# Patient Record
Sex: Female | Born: 1985 | Race: Black or African American | Hispanic: No | Marital: Single | Smoking: Never smoker
Health system: Southern US, Community
[De-identification: ages and names within clinical notes are randomized; demographics above are authoritative.]

## PROBLEM LIST (undated history)

## (undated) DIAGNOSIS — I10 Essential (primary) hypertension: Secondary | ICD-10-CM

## (undated) DIAGNOSIS — F419 Anxiety disorder, unspecified: Secondary | ICD-10-CM

## (undated) DIAGNOSIS — F1910 Other psychoactive substance abuse, uncomplicated: Secondary | ICD-10-CM

## (undated) DIAGNOSIS — F32A Depression, unspecified: Secondary | ICD-10-CM

## (undated) DIAGNOSIS — M311 Thrombotic microangiopathy: Secondary | ICD-10-CM

## (undated) DIAGNOSIS — Z9289 Personal history of other medical treatment: Secondary | ICD-10-CM

## (undated) DIAGNOSIS — E039 Hypothyroidism, unspecified: Secondary | ICD-10-CM

## (undated) DIAGNOSIS — F329 Major depressive disorder, single episode, unspecified: Secondary | ICD-10-CM

## (undated) DIAGNOSIS — D649 Anemia, unspecified: Secondary | ICD-10-CM

## (undated) DIAGNOSIS — M3119 Other thrombotic microangiopathy: Secondary | ICD-10-CM

## (undated) HISTORY — DX: Other thrombotic microangiopathy: M31.19

## (undated) HISTORY — PX: TUBAL LIGATION: SHX77

## (undated) HISTORY — DX: Major depressive disorder, single episode, unspecified: F32.9

## (undated) HISTORY — DX: Anemia, unspecified: D64.9

## (undated) HISTORY — DX: Anxiety disorder, unspecified: F41.9

## (undated) HISTORY — DX: Depression, unspecified: F32.A

## (undated) HISTORY — DX: Thrombotic microangiopathy: M31.1

## (undated) HISTORY — PX: OTHER SURGICAL HISTORY: SHX169

---

## 2015-11-25 DIAGNOSIS — R102 Pelvic and perineal pain: Secondary | ICD-10-CM | POA: Insufficient documentation

## 2017-11-01 DIAGNOSIS — F329 Major depressive disorder, single episode, unspecified: Secondary | ICD-10-CM | POA: Insufficient documentation

## 2017-11-01 DIAGNOSIS — D696 Thrombocytopenia, unspecified: Secondary | ICD-10-CM | POA: Insufficient documentation

## 2017-11-01 DIAGNOSIS — F32A Depression, unspecified: Secondary | ICD-10-CM | POA: Insufficient documentation

## 2017-11-01 DIAGNOSIS — D649 Anemia, unspecified: Secondary | ICD-10-CM | POA: Insufficient documentation

## 2017-11-15 ENCOUNTER — Telehealth: Payer: Self-pay | Admitting: Hematology

## 2017-11-15 NOTE — Telephone Encounter (Signed)
Pt has been scheduled to see Dr. Irene Limbo on 6/24 at 1pm. Pt aware to arrive 30 minutes early.

## 2017-11-23 NOTE — Progress Notes (Signed)
HEMATOLOGY/ONCOLOGY CONSULTATION NOTE  Date of Service: 11/26/2017  Patient Care Team: Patient, No Pcp Per as PCP - General (General Practice)  CHIEF COMPLAINTS/PURPOSE OF CONSULTATION:  Thrombotic microangiopathy  HISTORY OF PRESENTING ILLNESS:   Sarah Washington is a wonderful 32 y.o. female who has been referred to [place] by Dr Mabeline Caras for evaluation and management of recently diagnosed acquired TTP/Thrombotic microangiopathy. She is accompanied today by her three children. The pt reports that she is doing well overall.   The pt reports that in January 2019 she began feeling weak, tired, and developed a newly heavy menstrual cycle. She also sites bruising easily that began occurring in January 2019 but denies nose bleeds, gum bleeds, and blood in the stools at the time. She notes that she first had labs to evaluate these symptoms She noted finger tip neuropathy, severe headache, abdominal pains, and tingling in her feet. She denies any infections at the time, PO contraceptives, hormone therapy, and new medications at the time. She also denies recent blood transfusions as well. She was discharged with Atovaquone (for PCP prophylaxis). 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  The pt presented to [facility] on 11/02/17 for TTP.  Her last plasma exchange was on 11/07/17. She received 5 total plasma exchanges. She received her third Rituxan infusion on 11/15/17, and only notes some mild itching but denies any rashes with her infusion. She has not yet received her fourth dose or Rituxan as she has moved from [facility] to [place]. She has been taking 30mg  Prednisone since 11/15/17.  She adds that she has gained 20 pounds of weight, noting that she has a very strong appetite while taking steroids.    The pt has previously taken BP medication for her  HTN.  She received 5 units of blood, and no plasma exchange after delivery of her third child was premature at 5 months due to HELLP syndrome.   Most recent lab results (11/15/17) of CBC  is as follows: all values are WNL except for WBC at 12.4k, RBC at 3.69, HGB at 11.8, RDW at 22.3, MPV at 6.9, PLT at 137k.  On review of systems, pt reports increased appetite, weight gain, normalized menstrual bleeding, and denies headaches, peripheral neuropathy, fevers, chills, night sweats, back pains, abdominal pains, changes in bowel habits, and any other symptoms.   On PMHx the pt reports HTN, tubal ligation in 2010, HELLP syndrome with third pregnancy requiring delivery after 5 months. Angioedema reaction to sulfa drugs.  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  On Family Hx the pt denies any blood, cancer, or autoimmune disorders.   MEDICAL HISTORY:   HTN, tubal ligation in 2010, HELLP syndrome with third pregnancy requiring delivery after 5 months. Angioedema reaction to sulfa drugs.   SURGICAL HISTORY: tubal ligation in 2010  SOCIAL HISTORY: Social History   Socioeconomic History  . Marital status: Single    Spouse name: Not on file  . Number of children: Not on file  . Years of education: Not on file  . Highest education level: Not on file  Occupational History  . Not on file  Social Needs  . Financial resource strain: Not on file  . Food insecurity:    Worry: Not on file    Inability: Not on file  . Transportation needs:    Medical: Not on  file    Non-medical: Not on file  Tobacco Use  . Smoking status: Former Smoker    Types: Cigarettes    Last attempt to quit: 10/2017    Years since quitting: 0.1  . Smokeless tobacco: Never Used  Substance and Sexual Activity  . Alcohol use: Yes  . Drug use: Never  . Sexual activity: Yes  Lifestyle  . Physical activity:    Days per week: Not on file    Minutes per session: Not on file  . Stress: Not on file  Relationships  .  Social connections:    Talks on phone: Not on file    Gets together: Not on file    Attends religious service: Not on file    Active member of club or organization: Not on file    Attends meetings of clubs or organizations: Not on file    Relationship status: Not on file  . Intimate partner violence:    Fear of current or ex partner: Not on file    Emotionally abused: Not on file    Physically abused: Not on file    Forced sexual activity: Not on file  Other Topics Concern  . Not on file  Social History Narrative  . Not on file    FAMILY HISTORY: Family History  Problem Relation Age of Onset  . Hypertension Mother   . Diabetes Mother   . Diabetes Father   . Hypertension Father     ALLERGIES:  is allergic to sulfa antibiotics.  MEDICATIONS:  Current Outpatient Medications  Medication Sig Dispense Refill  . folic acid (FOLVITE) 1 MG tablet Take 1 mg by mouth daily.    . pantoprazole (PROTONIX) 40 MG tablet Take 40 mg by mouth daily.     No current facility-administered medications for this visit.     REVIEW OF SYSTEMS:    10 Point review of Systems was done is negative except as noted above.  PHYSICAL EXAMINATION:  . Vitals:   11/26/17 1301  BP: (!) 145/93  Pulse: 100  Resp: 18  Temp: 99.4 F (37.4 C)  SpO2: 100%   Filed Weights   11/26/17 1301  Weight: 206 lb 8 oz (93.7 kg)   .Body mass index is 32.34 kg/m.  GENERAL:alert, in no acute distress and comfortable SKIN: no acute rashes, no significant lesions EYES: conjunctiva are pink and non-injected, sclera anicteric OROPHARYNX: MMM, no exudates, no oropharyngeal erythema or ulceration NECK: supple, no JVD LYMPH:  no palpable lymphadenopathy in the cervical, axillary or inguinal regions LUNGS: clear to auscultation b/l with normal respiratory effort HEART: regular rate & rhythm ABDOMEN:  normoactive bowel sounds , non tender, not distended. Extremity: no pedal edema PSYCH: alert & oriented x 3 with  fluent speech NEURO: no focal motor/sensory deficits  LABORATORY DATA:  I have reviewed the data as listed  .No flowsheet data found.  .No flowsheet data found.     11/15/17      . CBC Latest Ref Rng & Units 11/26/2017  WBC 3.9 - 10.3 K/uL 11.9(H)  Hemoglobin 11.6 - 15.9 g/dL 12.7  Hematocrit 34.8 - 46.6 % 38.1  Platelets 145 - 400 K/uL 238    . CMP Latest Ref Rng & Units 11/26/2017  Glucose 70 - 140 mg/dL 98  BUN 7 - 26 mg/dL 18  Creatinine 0.60 - 1.10 mg/dL 0.83  Sodium 136 - 145 mmol/L 137  Potassium 3.5 - 5.1 mmol/L 4.2  Chloride 98 - 109 mmol/L 105  CO2 22 - 29 mmol/L 24  Calcium 8.4 - 10.4 mg/dL 9.6  Total Protein 6.4 - 8.3 g/dL 7.6  Total Bilirubin 0.2 - 1.2 mg/dL 0.2  Alkaline Phos 40 - 150 U/L 49  AST 5 - 34 U/L 16  ALT 0 - 55 U/L 22   . Lab Results  Component Value Date   LDH 241 11/26/2017   Component     Latest Ref Rng & Units 11/26/2017  Adamts 13 Activity     >66.8 % 69.0  ADAMTS13 Activity comment      Comment    RADIOGRAPHIC STUDIES: I have personally reviewed the radiological images as listed and agreed with the findings in the report. No results found.  ASSESSMENT & PLAN:   32 y.o. female with  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 Severely low ADAMTS 13 levels of 6 on diagnosis.  S/p Plasmapheresis x 5. Rituxan weekly x 3 doses. PLAN  -Discussed patient's most recent labs from 11/15/17, WBC at 12.4k, PLT at 137k  -Will order labs today and results reviewed. PLT normalized. LDH WNL. -Will set pt up for fourth and last infusion of Rituxan ASAP -rpt ADAM TS13 levels done today have normalized at 69 -Will continue to monitor blood counts weekly -may reduce prednisone to 20mg  po daily -on Atovaquone for PJP prophylaxis -Will see pt back in 2 weeks   Labs today and weekly x 6 Rituxan needs to get her 4th and last doses ASAP (first 3 doses at [facility]) for TTP -RTC with Dr Irene Limbo in 2 weeks   All  of the patients questions were answered with apparent satisfaction. The patient knows to call the clinic with any problems, questions or concerns.  The toal time spent in the appt was 60 minutes and more than 50% was on counseling and direct patient cares, reviewing extensive outside records and education patient and answering multiple questions.    Sullivan Lone MD MS AAHIVMS [facility] Hematology/Oncology Physician [facility]  (Office):       [phone] (Work cell):  [phone] (Fax):           [phone]  11/26/2017 1:57 PM  I, Baldwin Jamaica, am acting as a Education administrator for Dr Irene Limbo.   .I have reviewed the above documentation for accuracy and completeness, and I agree with the above. Brunetta Genera MD

## 2017-11-26 ENCOUNTER — Inpatient Hospital Stay: Payer: Medicaid Other | Attending: Hematology | Admitting: Hematology

## 2017-11-26 ENCOUNTER — Encounter: Payer: Self-pay | Admitting: Hematology

## 2017-11-26 ENCOUNTER — Inpatient Hospital Stay: Payer: Medicaid Other

## 2017-11-26 VITALS — BP 145/93 | HR 100 | Temp 99.4°F | Resp 18 | Ht 67.0 in | Wt 206.5 lb

## 2017-11-26 DIAGNOSIS — Z5112 Encounter for antineoplastic immunotherapy: Secondary | ICD-10-CM | POA: Diagnosis not present

## 2017-11-26 DIAGNOSIS — M311 Thrombotic microangiopathy: Secondary | ICD-10-CM

## 2017-11-26 DIAGNOSIS — M3119 Other thrombotic microangiopathy: Secondary | ICD-10-CM

## 2017-11-26 DIAGNOSIS — Z7189 Other specified counseling: Secondary | ICD-10-CM

## 2017-11-26 LAB — CMP (CANCER CENTER ONLY)
ALK PHOS: 49 U/L (ref 40–150)
ALT: 22 U/L (ref 0–55)
AST: 16 U/L (ref 5–34)
Albumin: 3.9 g/dL (ref 3.5–5.0)
Anion gap: 8 (ref 3–11)
BUN: 18 mg/dL (ref 7–26)
CALCIUM: 9.6 mg/dL (ref 8.4–10.4)
CO2: 24 mmol/L (ref 22–29)
CREATININE: 0.83 mg/dL (ref 0.60–1.10)
Chloride: 105 mmol/L (ref 98–109)
GFR, Estimated: >60 mL/min (ref >60)
Glucose, Bld: 98 mg/dL (ref 70–140)
Potassium: 4.2 mmol/L (ref 3.5–5.1)
SODIUM: 137 mmol/L (ref 136–145)
Total Bilirubin: 0.2 mg/dL (ref 0.2–1.2)
Total Protein: 7.6 g/dL (ref 6.4–8.3)

## 2017-11-26 LAB — IRON AND TIBC
Iron: 59 ug/dL (ref 41–142)
Saturation Ratios: 19 % — ABNORMAL LOW (ref 21–57)
TIBC: 316 ug/dL (ref 236–444)
UIBC: 257 ug/dL

## 2017-11-26 LAB — CBC WITH DIFFERENTIAL/PLATELET
BASOS PCT: 1 %
Basophils Absolute: 0.1 10*3/uL (ref 0.0–0.1)
EOS ABS: 0 10*3/uL (ref 0.0–0.5)
EOS PCT: 0 %
HCT: 38.1 % (ref 34.8–46.6)
HEMOGLOBIN: 12.7 g/dL (ref 11.6–15.9)
Lymphocytes Relative: 11 %
Lymphs Abs: 1.3 10*3/uL (ref 0.9–3.3)
MCH: 31.7 pg (ref 25.1–34.0)
MCHC: 33.4 g/dL (ref 31.5–36.0)
MCV: 94.9 fL (ref 79.5–101.0)
MONO ABS: 0.2 10*3/uL (ref 0.1–0.9)
MONOS PCT: 1 %
NEUTROS PCT: 87 %
Neutro Abs: 10.3 10*3/uL — ABNORMAL HIGH (ref 1.5–6.5)
PLATELETS: 238 10*3/uL (ref 145–400)
RBC: 4.02 MIL/uL (ref 3.70–5.45)
RDW: 20.7 % — AB (ref 11.2–14.5)
WBC: 11.9 10*3/uL — ABNORMAL HIGH (ref 3.9–10.3)

## 2017-11-26 LAB — LACTATE DEHYDROGENASE: LDH: 241 U/L (ref 125–245)

## 2017-11-26 LAB — FERRITIN: Ferritin: 92 ng/mL (ref 9–269)

## 2017-11-26 NOTE — Progress Notes (Signed)
START OFF PATHWAY REGIMEN - Tawni Pummel Dx]   [id number]:Rituximab (Weekly):   Administer weekly:     Rituximab   **Always confirm dose/schedule in your pharmacy ordering system**  Administration Notes: for Acquired Severe TTP  Patient Characteristics: Intent of Therapy: Curative Intent, Discussed with Patient

## 2017-11-27 LAB — HEPATITIS C ANTIBODY: HCV Ab: <0.1 s/co ratio (ref 0.0–0.9)

## 2017-11-27 LAB — HEPATITIS B CORE ANTIBODY, TOTAL: Hep B Core Total Ab: NEGATIVE

## 2017-11-27 LAB — HIV ANTIBODY (ROUTINE TESTING W REFLEX): HIV Screen 4th Generation wRfx: NONREACTIVE

## 2017-11-27 LAB — HAPTOGLOBIN: Haptoglobin: 176 mg/dL (ref 34–200)

## 2017-11-28 LAB — ADAMTS13 ACTIVITY REFLEX

## 2017-11-28 LAB — ADAMTS13 ACTIVITY: Adamts 13 Activity: 69 % (ref >66.8)

## 2017-11-30 ENCOUNTER — Other Ambulatory Visit: Payer: Self-pay | Admitting: Hematology

## 2017-11-30 ENCOUNTER — Inpatient Hospital Stay: Payer: Medicaid Other

## 2017-11-30 VITALS — BP 146/89 | HR 98 | Temp 99.1°F | Resp 19

## 2017-11-30 DIAGNOSIS — M3119 Other thrombotic microangiopathy: Secondary | ICD-10-CM

## 2017-11-30 DIAGNOSIS — Z5112 Encounter for antineoplastic immunotherapy: Secondary | ICD-10-CM | POA: Diagnosis not present

## 2017-11-30 DIAGNOSIS — M311 Thrombotic microangiopathy: Secondary | ICD-10-CM

## 2017-11-30 DIAGNOSIS — Z7189 Other specified counseling: Secondary | ICD-10-CM

## 2017-11-30 MED ORDER — SODIUM CHLORIDE 0.9 % IV SOLN
375.0000 mg/m2 | Freq: Once | INTRAVENOUS | Status: AC
  Administered 2017-11-30: 800 mg via INTRAVENOUS
  Filled 2017-11-30: qty 30

## 2017-11-30 MED ORDER — DIPHENHYDRAMINE HCL 25 MG PO CAPS
50.0000 mg | ORAL_CAPSULE | Freq: Once | ORAL | Status: AC
  Administered 2017-11-30: 50 mg via ORAL

## 2017-11-30 MED ORDER — SODIUM CHLORIDE 0.9 % IV SOLN
Freq: Once | INTRAVENOUS | Status: AC
Start: 2017-11-30 — End: 2017-11-30
  Administered 2017-11-30: 13:00:00 via INTRAVENOUS

## 2017-11-30 MED ORDER — ACETAMINOPHEN 325 MG PO TABS
ORAL_TABLET | ORAL | Status: AC
  Filled 2017-11-30: qty 2

## 2017-11-30 MED ORDER — DIPHENHYDRAMINE HCL 25 MG PO CAPS
ORAL_CAPSULE | ORAL | Status: AC
  Filled 2017-11-30: qty 2

## 2017-11-30 MED ORDER — ACETAMINOPHEN 325 MG PO TABS
650.0000 mg | ORAL_TABLET | Freq: Once | ORAL | Status: AC
  Administered 2017-11-30: 650 mg via ORAL

## 2017-11-30 NOTE — Progress Notes (Signed)
I contacted [place] s/w Douglass Rivers, RN [phone]) who informed me of the following doses/administration of Rituxan: 11/01/17 Rituxan 746 mg IV given over standard rate; pt was inpatient. 11/08/17 Rituxan 746 mg IV given over "subsequent" rate; somewhat accelerated rate; not Rapid Inf Rituxan (this is their protocol); pt was inpatient. 11/15/17 Rituxan 746 mg IV given as Rapid Inf Rituxan.  Pt tolerated without complication.  Orders have been changed to Rapid Inf Rituxan to be given here today at [facility].  Per RN staff, pt prefers to get Rapid Inf Rituxan today.  Above info has been shared with Dr. Grier Mitts desk RN, Lanelle Bal and she will have Dr. Irene Limbo sign the orders.  Kennith Center, Pharm.D., CPP 11/30/2017@12 :56 PM

## 2017-11-30 NOTE — Patient Instructions (Signed)
[facility] Discharge Instructions for Patients Receiving Chemotherapy  Today you received the following chemotherapy agents:  Rituxan.  To help prevent nausea and vomiting after your treatment, we encourage you to take your nausea medication as directed.   If you develop nausea and vomiting that is not controlled by your nausea medication, call the clinic.   BELOW ARE SYMPTOMS THAT SHOULD BE REPORTED IMMEDIATELY:  *FEVER GREATER THAN 100.5 F  *CHILLS WITH OR WITHOUT FEVER  NAUSEA AND VOMITING THAT IS NOT CONTROLLED WITH YOUR NAUSEA MEDICATION  *UNUSUAL SHORTNESS OF BREATH  *UNUSUAL BRUISING OR BLEEDING  TENDERNESS IN MOUTH AND THROAT WITH OR WITHOUT PRESENCE OF ULCERS  *URINARY PROBLEMS  *BOWEL PROBLEMS  UNUSUAL RASH Items with * indicate a potential emergency and should be followed up as soon as possible.  Feel free to call the clinic should you have any questions or concerns. The clinic phone number is [phone].  Please show the CHEMO ALERT CARD at check-in to the Emergency Department and triage nurse.   

## 2017-11-30 NOTE — Progress Notes (Signed)
Spoke with patient over the phone today prior to arrival regarding her appointment length and advised her that as a first time Rituxan she would be here longer than scheduled and wanted to make her aware of that. She stated "this is my 4 time and I have had Rituxan 3 other times and I have been rapid Rituxan before and I have to be rapid today. I have done well in the past, not had problems and I cannot stay any longer today because I am moving and need to pick up the keys to my apartment". Pharmacy aware and confirmed this with [facility] and Dr. Irene Limbo aware that this was the patient's request.

## 2017-12-03 ENCOUNTER — Other Ambulatory Visit: Payer: Medicaid Other

## 2017-12-03 ENCOUNTER — Telehealth: Payer: Self-pay

## 2017-12-03 ENCOUNTER — Inpatient Hospital Stay: Payer: Medicaid Other | Attending: Hematology

## 2017-12-03 DIAGNOSIS — Z7189 Other specified counseling: Secondary | ICD-10-CM

## 2017-12-03 DIAGNOSIS — M7989 Other specified soft tissue disorders: Secondary | ICD-10-CM | POA: Insufficient documentation

## 2017-12-03 DIAGNOSIS — M311 Thrombotic microangiopathy: Secondary | ICD-10-CM | POA: Insufficient documentation

## 2017-12-03 DIAGNOSIS — M3119 Other thrombotic microangiopathy: Secondary | ICD-10-CM

## 2017-12-03 DIAGNOSIS — I10 Essential (primary) hypertension: Secondary | ICD-10-CM | POA: Diagnosis not present

## 2017-12-03 DIAGNOSIS — Z87891 Personal history of nicotine dependence: Secondary | ICD-10-CM | POA: Insufficient documentation

## 2017-12-03 LAB — CBC WITH DIFFERENTIAL/PLATELET
Basophils Absolute: 0 10*3/uL (ref 0.0–0.1)
Basophils Relative: 0 %
EOS ABS: 0 10*3/uL (ref 0.0–0.5)
Eosinophils Relative: 0 %
HEMATOCRIT: 37 % (ref 34.8–46.6)
HEMOGLOBIN: 12.2 g/dL (ref 11.6–15.9)
LYMPHS ABS: 1.5 10*3/uL (ref 0.9–3.3)
Lymphocytes Relative: 17 %
MCH: 31.5 pg (ref 25.1–34.0)
MCHC: 33 g/dL (ref 31.5–36.0)
MCV: 95.6 fL (ref 79.5–101.0)
MONOS PCT: 1 %
Monocytes Absolute: 0.1 10*3/uL (ref 0.1–0.9)
NEUTROS PCT: 82 %
Neutro Abs: 7.6 10*3/uL — ABNORMAL HIGH (ref 1.5–6.5)
PLATELETS: 337 10*3/uL (ref 145–400)
RBC: 3.87 MIL/uL (ref 3.70–5.45)
RDW: 17 % — AB (ref 11.2–14.5)
WBC: 9.1 10*3/uL (ref 3.9–10.3)

## 2017-12-03 LAB — CMP (CANCER CENTER ONLY)
ALBUMIN: 4 g/dL (ref 3.5–5.0)
ALT: 21 U/L (ref 0–44)
ANION GAP: 7 (ref 5–15)
AST: 13 U/L — ABNORMAL LOW (ref 15–41)
Alkaline Phosphatase: 58 U/L (ref 38–126)
BUN: 17 mg/dL (ref 6–20)
CO2: 25 mmol/L (ref 22–32)
Calcium: 9.6 mg/dL (ref 8.9–10.3)
Chloride: 108 mmol/L (ref 98–111)
Creatinine: 1.18 mg/dL — ABNORMAL HIGH (ref 0.44–1.00)
GFR, EST NON AFRICAN AMERICAN: 59 mL/min — AB (ref >60)
GFR, Est AFR Am: >60 mL/min (ref >60)
Glucose, Bld: 104 mg/dL — ABNORMAL HIGH (ref 70–99)
POTASSIUM: 4.5 mmol/L (ref 3.5–5.1)
Sodium: 140 mmol/L (ref 135–145)
TOTAL PROTEIN: 7.6 g/dL (ref 6.5–8.1)

## 2017-12-03 LAB — RETICULOCYTES
RBC.: 3.87 MIL/uL (ref 3.70–5.45)
RETIC COUNT ABSOLUTE: 77.4 10*3/uL (ref 33.7–90.7)
RETIC CT PCT: 2 % (ref 0.7–2.1)

## 2017-12-03 NOTE — Telephone Encounter (Signed)
Per message received from Dr. Irene Limbo called the patient and let her know "her labs from 6/24 showed no anemia and normal platelets and ADAMTS 13 levels -- can reduce prednisone to 20mg  po daily -- further taper gradually based on f/u labs in 2 weeks. Patient verbalized understanding.

## 2017-12-04 LAB — LACTATE DEHYDROGENASE: LDH: 283 U/L — ABNORMAL HIGH (ref 98–192)

## 2017-12-07 NOTE — Progress Notes (Signed)
HEMATOLOGY/ONCOLOGY CONSULTATION NOTE  Date of Service: 12/10/2017  Patient Care Team: Patient, No Pcp Per as PCP - General (General Practice)  CHIEF COMPLAINTS/PURPOSE OF CONSULTATION:  Thrombotic microangiopathy  HISTORY OF PRESENTING ILLNESS:   Sarah Washington is a wonderful 32 y.o. female who has been referred to [place] by Dr Mabeline Caras for evaluation and management of recently diagnosed acquired TTP/Thrombotic microangiopathy. She is accompanied today by her three children. The pt reports that she is doing well overall.   The pt reports that in January 2019 she began feeling weak, tired, and developed a newly heavy menstrual cycle. She also sites bruising easily that began occurring in January 2019 but denies nose bleeds, gum bleeds, and blood in the stools at the time. She notes that she first had labs to evaluate these symptoms She noted finger tip neuropathy, severe headache, abdominal pains, and tingling in her feet. She denies any infections at the time, PO contraceptives, hormone therapy, and new medications at the time. She also denies recent blood transfusions as well. She was discharged with Atovaquone (for PCP prophylaxis). 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  The pt presented to [facility] on 11/02/17 for TTP.  Her last plasma exchange was on 11/07/17. She received 5 total plasma exchanges. She received her third Rituxan infusion on 11/15/17, and only notes some mild itching but denies any rashes with her infusion. She has not yet received her fourth dose or Rituxan as she has moved from [facility] to [place]. She has been taking 30mg  Prednisone since 11/15/17.  She adds that she has gained 20 pounds of weight, noting that she has a very strong appetite while taking steroids.    The pt has previously taken BP medication for her  HTN.  She received 5 units of blood, and no plasma exchange after delivery of her third child was premature at 5 months due to HELLP syndrome.   Most recent lab results (11/15/17) of CBC  is as follows: all values are WNL except for WBC at 12.4k, RBC at 3.69, HGB at 11.8, RDW at 22.3, MPV at 6.9, PLT at 137k.  On review of systems, pt reports increased appetite, weight gain, normalized menstrual bleeding, and denies headaches, peripheral neuropathy, fevers, chills, night sweats, back pains, abdominal pains, changes in bowel habits, and any other symptoms.   On PMHx the pt reports HTN, tubal ligation in 2010, HELLP syndrome with third pregnancy requiring delivery after 5 months. Angioedema reaction to sulfa drugs.  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  On Family Hx the pt denies any blood, cancer, or autoimmune disorders.  Interval History:   Sarah Washington returns today regarding management and evaluation of her TTP. The patient's last visit with [place] was on 11/26/17. The pt reports that she is doing well overall.   The pt reports that she tolerated the fourth and final Rituxan infusion well. She is down to 20mg  Prednisone on her taper and is pleased to report that her appetite has decreased to a more normal level. She is continuing to take Atovaquone without difficulty.   She notes that her right arm feels a little tight and believes that it has swollen some more than her left arm. She has noticed this over the past couple days and has attributed it to her recent weight gain while being on  steroids and having a stronger than normal appetite.   She continues on her folic acid and Vitamin B complex.   Lab results (12/03/17) of CBC w/diff, CMP, and Reticulocytes is as follows: all values are WNL except for RDW at 15.9, AST at 13, Total Bilirubin at <0.2. LDH 12/10/17 is elevated at 249 ADAM5S13 Activity on 11/26/17 was normal at 69.0  On review of systems, pt reports right arm  swelling, normalized appetite, and denies abdominal pains, leg swelling, SOB, CP and any other symptoms.   MEDICAL HISTORY:   HTN, tubal ligation in 2010, HELLP syndrome with third pregnancy requiring delivery after 5 months. Angioedema reaction to sulfa drugs.   SURGICAL HISTORY: tubal ligation in 2010  SOCIAL HISTORY: Social History   Socioeconomic History  . Marital status: Single    Spouse name: Not on file  . Number of children: Not on file  . Years of education: Not on file  . Highest education level: Not on file  Occupational History  . Not on file  Social Needs  . Financial resource strain: Not on file  . Food insecurity:    Worry: Not on file    Inability: Not on file  . Transportation needs:    Medical: Not on file    Non-medical: Not on file  Tobacco Use  . Smoking status: Former Smoker    Types: Cigarettes    Last attempt to quit: 10/2017    Years since quitting: 0.1  . Smokeless tobacco: Never Used  Substance and Sexual Activity  . Alcohol use: Yes  . Drug use: Never  . Sexual activity: Yes  Lifestyle  . Physical activity:    Days per week: Not on file    Minutes per session: Not on file  . Stress: Not on file  Relationships  . Social connections:    Talks on phone: Not on file    Gets together: Not on file    Attends religious service: Not on file    Active member of club or organization: Not on file    Attends meetings of clubs or organizations: Not on file    Relationship status: Not on file  . Intimate partner violence:    Fear of current or ex partner: Not on file    Emotionally abused: Not on file    Physically abused: Not on file    Forced sexual activity: Not on file  Other Topics Concern  . Not on file  Social History Narrative  . Not on file    FAMILY HISTORY: Family History  Problem Relation Age of Onset  . Hypertension Mother   . Diabetes Mother   . Diabetes Father   . Hypertension Father     ALLERGIES:  is allergic to  sulfa antibiotics.  MEDICATIONS:  Current Outpatient Medications  Medication Sig Dispense Refill  . folic acid (FOLVITE) 1 MG tablet Take 1 tablet (1 mg total) by mouth daily. 60 tablet 2  . pantoprazole (PROTONIX) 40 MG tablet Take 40 mg by mouth daily.    . predniSONE (DELTASONE) 10 MG tablet 20mg  po daily for 1 week then 10mg  po daily till futher taper instructions     No current facility-administered medications for this visit.     REVIEW OF SYSTEMS:    A 10+ POINT REVIEW OF SYSTEMS WAS OBTAINED including neurology, dermatology, psychiatry, cardiac, respiratory, lymph, extremities, GI, GU, Musculoskeletal, constitutional, breasts, reproductive, HEENT.  All pertinent positives are noted in the HPI.  All others  are negative.   PHYSICAL EXAMINATION:  . Vitals:   12/10/17 1520  BP: (!) 159/111  Pulse: (!) 111  Resp: 18  Temp: 98.9 F (37.2 C)  SpO2: 100%   Filed Weights   12/10/17 1520  Weight: 210 lb (95.3 kg)   .Body mass index is 32.89 kg/m.  GENERAL:alert, in no acute distress and comfortable SKIN: no acute rashes, no significant lesions EYES: conjunctiva are pink and non-injected, sclera anicteric OROPHARYNX: MMM, no exudates, no oropharyngeal erythema or ulceration NECK: supple, no JVD LYMPH:  no palpable lymphadenopathy in the cervical, axillary or inguinal regions LUNGS: clear to auscultation b/l with normal respiratory effort HEART: regular rate & rhythm ABDOMEN:  normoactive bowel sounds , non tender, not distended. No palpable hepatosplenomegaly.  Extremity: no pedal edema PSYCH: alert & oriented x 3 with fluent speech NEURO: no focal motor/sensory deficits   LABORATORY DATA:  I have reviewed the data as listed  . CBC Latest Ref Rng & Units 12/10/2017 12/03/2017 11/26/2017  WBC 3.9 - 10.3 K/uL 9.3 9.1 11.9(H)  Hemoglobin 11.6 - 15.9 g/dL 12.4 12.2 12.7  Hematocrit 34.8 - 46.6 % 37.3 37.0 38.1  Platelets 145 - 400 K/uL 296 337 238    . CMP Latest Ref  Rng & Units 12/10/2017 12/03/2017 11/26/2017  Glucose 70 - 99 mg/dL 99 104(H) 98  BUN 6 - 20 mg/dL 13 17 18   Creatinine 0.44 - 1.00 mg/dL 0.98 1.18(H) 0.83  Sodium 135 - 145 mmol/L 139 140 137  Potassium 3.5 - 5.1 mmol/L 3.8 4.5 4.2  Chloride 98 - 111 mmol/L 106 108 105  CO2 22 - 32 mmol/L 26 25 24   Calcium 8.9 - 10.3 mg/dL 9.7 9.6 9.6  Total Protein 6.5 - 8.1 g/dL 7.3 7.6 7.6  Total Bilirubin 0.3 - 1.2 mg/dL <0.2(L) <0.2(L) 0.2  Alkaline Phos 38 - 126 U/L 55 58 49  AST 15 - 41 U/L 13(L) 13(L) 16  ALT 0 - 44 U/L 22 21 22        11/15/17      . CBC Latest Ref Rng & Units 12/10/2017 12/03/2017 11/26/2017  WBC 3.9 - 10.3 K/uL 9.3 9.1 11.9(H)  Hemoglobin 11.6 - 15.9 g/dL 12.4 12.2 12.7  Hematocrit 34.8 - 46.6 % 37.3 37.0 38.1  Platelets 145 - 400 K/uL 296 337 238    . CMP Latest Ref Rng & Units 12/10/2017 12/03/2017 11/26/2017  Glucose 70 - 99 mg/dL 99 104(H) 98  BUN 6 - 20 mg/dL 13 17 18   Creatinine 0.44 - 1.00 mg/dL 0.98 1.18(H) 0.83  Sodium 135 - 145 mmol/L 139 140 137  Potassium 3.5 - 5.1 mmol/L 3.8 4.5 4.2  Chloride 98 - 111 mmol/L 106 108 105  CO2 22 - 32 mmol/L 26 25 24   Calcium 8.9 - 10.3 mg/dL 9.7 9.6 9.6  Total Protein 6.5 - 8.1 g/dL 7.3 7.6 7.6  Total Bilirubin 0.3 - 1.2 mg/dL <0.2(L) <0.2(L) 0.2  Alkaline Phos 38 - 126 U/L 55 58 49  AST 15 - 41 U/L 13(L) 13(L) 16  ALT 0 - 44 U/L 22 21 22    . Lab Results  Component Value Date   LDH 249 (H) 12/10/2017   Component     Latest Ref Rng & Units 11/26/2017  Adamts 13 Activity     >66.8 % 69.0  ADAMTS13 Activity comment      Comment    RADIOGRAPHIC STUDIES: I have personally reviewed the radiological images as listed and agreed with the  findings in the report. No results found.  ASSESSMENT & PLAN:   32 y.o. female with  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  Severely low ADAMTS 13 levels of 6 on diagnosis. Now normalized to 69 S/p Plasmapheresis x 5. Completed Rituxan weekly x 4  doses.  PLAN:  -Continue Atovaquone for PJP prophylaxis -Discussed pt labwork from, 12/03/17; PLT at 296k, no anemia  -Discussed ADAMTS13 from 11/26/17 which was normal  -Continue 20mg  Prednisone for 2 more weeks and then down to 10mg  for 2 weeks -Will continue to monitor blood counts weekly -Will see pt back in 2 weeks with labs  2. RUE swelling -Will order Right Upper Extremity Venous US today to rule out blood clots -done -- no evidence of DVT Final Interpretation:  Right: No evidence of deep vein thrombosis in the upper extremity. No evidence of superficial vein thrombosis in the upper extremity.  Left: No evidence of thrombosis in the subclavian.   [place] RUE today/tommorrow to r/o DVT RTC with Dr Irene Limbo in 2 weeks with labs   All of the patients questions were answered with apparent satisfaction. The patient knows to call the clinic with any problems, questions or concerns.  . The total time spent in the appointment was 25 minutes and more than 50% was on counseling and direct patient cares.   Sullivan Lone MD MS AAHIVMS [facility] Hematology/Oncology Physician [facility]  (Office):       [phone] (Work cell):  [phone] (Fax):           [phone]  12/10/2017 3:37 PM  I, Baldwin Jamaica, am acting as a Education administrator for Dr Irene Limbo.   .I have reviewed the above documentation for accuracy and completeness, and I agree with the above. Brunetta Genera MD

## 2017-12-10 ENCOUNTER — Inpatient Hospital Stay (HOSPITAL_BASED_OUTPATIENT_CLINIC_OR_DEPARTMENT_OTHER): Payer: Medicaid Other | Admitting: Hematology

## 2017-12-10 ENCOUNTER — Telehealth: Payer: Self-pay

## 2017-12-10 ENCOUNTER — Inpatient Hospital Stay: Payer: Medicaid Other

## 2017-12-10 VITALS — BP 159/111 | HR 111 | Temp 98.9°F | Resp 18 | Ht 67.0 in | Wt 210.0 lb

## 2017-12-10 DIAGNOSIS — M3119 Other thrombotic microangiopathy: Secondary | ICD-10-CM

## 2017-12-10 DIAGNOSIS — M311 Thrombotic microangiopathy: Secondary | ICD-10-CM

## 2017-12-10 DIAGNOSIS — Z7189 Other specified counseling: Secondary | ICD-10-CM

## 2017-12-10 DIAGNOSIS — M7989 Other specified soft tissue disorders: Secondary | ICD-10-CM | POA: Diagnosis not present

## 2017-12-10 LAB — CBC WITH DIFFERENTIAL/PLATELET
BASOS ABS: 0 10*3/uL (ref 0.0–0.1)
BASOS PCT: 0 %
EOS ABS: 0.1 10*3/uL (ref 0.0–0.5)
Eosinophils Relative: 1 %
HCT: 37.3 % (ref 34.8–46.6)
HEMOGLOBIN: 12.4 g/dL (ref 11.6–15.9)
LYMPHS ABS: 2.9 10*3/uL (ref 0.9–3.3)
Lymphocytes Relative: 31 %
MCH: 31.2 pg (ref 25.1–34.0)
MCHC: 33.2 g/dL (ref 31.5–36.0)
MCV: 94 fL (ref 79.5–101.0)
Monocytes Absolute: 0.8 10*3/uL (ref 0.1–0.9)
Monocytes Relative: 9 %
NEUTROS PCT: 59 %
Neutro Abs: 5.6 10*3/uL (ref 1.5–6.5)
Platelets: 296 10*3/uL (ref 145–400)
RBC: 3.97 MIL/uL (ref 3.70–5.45)
RDW: 15.9 % — ABNORMAL HIGH (ref 11.2–14.5)
WBC: 9.3 10*3/uL (ref 3.9–10.3)

## 2017-12-10 LAB — CMP (CANCER CENTER ONLY)
ALK PHOS: 55 U/L (ref 38–126)
ALT: 22 U/L (ref 0–44)
ANION GAP: 7 (ref 5–15)
AST: 13 U/L — AB (ref 15–41)
Albumin: 3.6 g/dL (ref 3.5–5.0)
BUN: 13 mg/dL (ref 6–20)
CO2: 26 mmol/L (ref 22–32)
Calcium: 9.7 mg/dL (ref 8.9–10.3)
Chloride: 106 mmol/L (ref 98–111)
Creatinine: 0.98 mg/dL (ref 0.44–1.00)
GLUCOSE: 99 mg/dL (ref 70–99)
POTASSIUM: 3.8 mmol/L (ref 3.5–5.1)
SODIUM: 139 mmol/L (ref 135–145)
TOTAL PROTEIN: 7.3 g/dL (ref 6.5–8.1)

## 2017-12-10 LAB — RETICULOCYTES
RBC.: 3.97 MIL/uL (ref 3.70–5.45)
RETIC COUNT ABSOLUTE: 67.5 10*3/uL (ref 33.7–90.7)
Retic Ct Pct: 1.7 % (ref 0.7–2.1)

## 2017-12-10 LAB — LACTATE DEHYDROGENASE: LDH: 249 U/L — AB (ref 98–192)

## 2017-12-10 MED ORDER — FOLIC ACID 1 MG PO TABS: 1.0000 mg | ORAL_TABLET | Freq: Every day | ORAL | 2 refills | Status: DC

## 2017-12-10 NOTE — Telephone Encounter (Signed)
PRINTED AVS AND CALENDER OF UPCOMING APPOINTMENT. PER 7/8 LOS . SCHEDULED [place] RUE FOR 7/9 AT [place].

## 2017-12-11 ENCOUNTER — Ambulatory Visit (HOSPITAL_COMMUNITY)
Admission: RE | Admit: 2017-12-11 | Discharge: 2017-12-11 | Disposition: A | Discharge status: Home / Self Care | Payer: Medicaid Other | Source: Ambulatory Visit | Attending: Hematology | Admitting: Hematology

## 2017-12-11 DIAGNOSIS — M7989 Other specified soft tissue disorders: Secondary | ICD-10-CM | POA: Insufficient documentation

## 2017-12-11 NOTE — Progress Notes (Signed)
RUE venous duplex prelim: negative for DVT and SVT. Landry Mellow, RDMS, RVT   Attempted call report with no answer to Dr. Irene Limbo. Will let patient leave.

## 2017-12-17 ENCOUNTER — Inpatient Hospital Stay: Payer: Medicaid Other

## 2017-12-17 ENCOUNTER — Other Ambulatory Visit: Payer: Medicaid Other

## 2017-12-17 DIAGNOSIS — M3119 Other thrombotic microangiopathy: Secondary | ICD-10-CM

## 2017-12-17 DIAGNOSIS — Z7189 Other specified counseling: Secondary | ICD-10-CM

## 2017-12-17 DIAGNOSIS — M311 Thrombotic microangiopathy: Secondary | ICD-10-CM

## 2017-12-17 LAB — CMP (CANCER CENTER ONLY)
ALT: 17 U/L (ref 0–44)
ANION GAP: 8 (ref 5–15)
AST: 12 U/L — ABNORMAL LOW (ref 15–41)
Albumin: 3.6 g/dL (ref 3.5–5.0)
Alkaline Phosphatase: 58 U/L (ref 38–126)
BUN: 19 mg/dL (ref 6–20)
CO2: 25 mmol/L (ref 22–32)
Calcium: 9.3 mg/dL (ref 8.9–10.3)
Chloride: 107 mmol/L (ref 98–111)
Creatinine: 1.33 mg/dL — ABNORMAL HIGH (ref 0.44–1.00)
GFR, EST NON AFRICAN AMERICAN: 51 mL/min — AB (ref >60)
GFR, Est AFR Am: 60 mL/min — ABNORMAL LOW (ref >60)
Glucose, Bld: 111 mg/dL — ABNORMAL HIGH (ref 70–99)
POTASSIUM: 3.7 mmol/L (ref 3.5–5.1)
Sodium: 140 mmol/L (ref 135–145)
TOTAL PROTEIN: 7.7 g/dL (ref 6.5–8.1)

## 2017-12-17 LAB — LACTATE DEHYDROGENASE: LDH: 224 U/L — ABNORMAL HIGH (ref 98–192)

## 2017-12-17 LAB — CBC WITH DIFFERENTIAL/PLATELET
BASOS ABS: 0 10*3/uL (ref 0.0–0.1)
Basophils Relative: 0 %
EOS PCT: 0 %
Eosinophils Absolute: 0 10*3/uL (ref 0.0–0.5)
HEMATOCRIT: 34.3 % — AB (ref 34.8–46.6)
Hemoglobin: 11.6 g/dL (ref 11.6–15.9)
LYMPHS PCT: 26 %
Lymphs Abs: 3.5 10*3/uL — ABNORMAL HIGH (ref 0.9–3.3)
MCH: 31.3 pg (ref 25.1–34.0)
MCHC: 33.8 g/dL (ref 31.5–36.0)
MCV: 92.5 fL (ref 79.5–101.0)
Monocytes Absolute: 0.4 10*3/uL (ref 0.1–0.9)
Monocytes Relative: 3 %
NEUTROS ABS: 9.3 10*3/uL — AB (ref 1.5–6.5)
Neutrophils Relative %: 71 %
PLATELETS: 333 10*3/uL (ref 145–400)
RBC: 3.71 MIL/uL (ref 3.70–5.45)
RDW: 15.4 % — ABNORMAL HIGH (ref 11.2–14.5)
WBC: 13.2 10*3/uL — AB (ref 3.9–10.3)

## 2017-12-17 LAB — RETICULOCYTES
RBC.: 3.71 MIL/uL (ref 3.70–5.45)
RETIC COUNT ABSOLUTE: 74.2 10*3/uL (ref 33.7–90.7)
Retic Ct Pct: 2 % (ref 0.7–2.1)

## 2017-12-24 ENCOUNTER — Other Ambulatory Visit: Payer: Medicaid Other

## 2017-12-25 ENCOUNTER — Telehealth: Payer: Self-pay | Admitting: Hematology

## 2017-12-25 ENCOUNTER — Inpatient Hospital Stay (HOSPITAL_BASED_OUTPATIENT_CLINIC_OR_DEPARTMENT_OTHER): Payer: Medicaid Other | Admitting: Hematology

## 2017-12-25 ENCOUNTER — Inpatient Hospital Stay: Payer: Medicaid Other

## 2017-12-25 VITALS — BP 135/96 | HR 112 | Temp 98.6°F | Resp 18 | Ht 67.0 in | Wt 217.8 lb

## 2017-12-25 DIAGNOSIS — M3119 Other thrombotic microangiopathy: Secondary | ICD-10-CM

## 2017-12-25 DIAGNOSIS — Z7189 Other specified counseling: Secondary | ICD-10-CM

## 2017-12-25 DIAGNOSIS — M311 Thrombotic microangiopathy: Secondary | ICD-10-CM

## 2017-12-25 DIAGNOSIS — M7989 Other specified soft tissue disorders: Secondary | ICD-10-CM

## 2017-12-25 LAB — CBC WITH DIFFERENTIAL/PLATELET
Basophils Absolute: 0 10*3/uL (ref 0.0–0.1)
Basophils Relative: 0 %
Eosinophils Absolute: 0.1 10*3/uL (ref 0.0–0.5)
Eosinophils Relative: 1 %
HEMATOCRIT: 39.1 % (ref 34.8–46.6)
HEMOGLOBIN: 13 g/dL (ref 11.6–15.9)
LYMPHS ABS: 4.1 10*3/uL — AB (ref 0.9–3.3)
Lymphocytes Relative: 35 %
MCH: 31.1 pg (ref 25.1–34.0)
MCHC: 33.2 g/dL (ref 31.5–36.0)
MCV: 93.5 fL (ref 79.5–101.0)
Monocytes Absolute: 0.7 10*3/uL (ref 0.1–0.9)
Monocytes Relative: 6 %
NEUTROS ABS: 6.8 10*3/uL — AB (ref 1.5–6.5)
NEUTROS PCT: 58 %
Platelets: 313 10*3/uL (ref 145–400)
RBC: 4.18 MIL/uL (ref 3.70–5.45)
RDW: 15.1 % — ABNORMAL HIGH (ref 11.2–14.5)
WBC: 11.7 10*3/uL — ABNORMAL HIGH (ref 3.9–10.3)

## 2017-12-25 LAB — CMP (CANCER CENTER ONLY)
ALBUMIN: 3.4 g/dL — AB (ref 3.5–5.0)
ALT: 17 U/L (ref 0–44)
ANION GAP: 8 (ref 5–15)
AST: 14 U/L — ABNORMAL LOW (ref 15–41)
Alkaline Phosphatase: 65 U/L (ref 38–126)
BUN: 21 mg/dL — ABNORMAL HIGH (ref 6–20)
CHLORIDE: 107 mmol/L (ref 98–111)
CO2: 24 mmol/L (ref 22–32)
Calcium: 9.3 mg/dL (ref 8.9–10.3)
Creatinine: 0.95 mg/dL (ref 0.44–1.00)
GFR, Estimated: >60 mL/min (ref >60)
GLUCOSE: 132 mg/dL — AB (ref 70–99)
Potassium: 4.6 mmol/L (ref 3.5–5.1)
SODIUM: 139 mmol/L (ref 135–145)
Total Bilirubin: <0.2 mg/dL — ABNORMAL LOW (ref 0.3–1.2)
Total Protein: 7.6 g/dL (ref 6.5–8.1)

## 2017-12-25 LAB — LACTATE DEHYDROGENASE: LDH: 185 U/L (ref 98–192)

## 2017-12-25 LAB — RETICULOCYTES
RBC.: 4.18 MIL/uL (ref 3.70–5.45)
RETIC COUNT ABSOLUTE: 83.6 10*3/uL (ref 33.7–90.7)
RETIC CT PCT: 2 % (ref 0.7–2.1)

## 2017-12-25 NOTE — Progress Notes (Signed)
HEMATOLOGY/ONCOLOGY CONSULTATION NOTE  Date of Service: 12/25/2017  Patient Care Team: Patient, No Pcp Per as PCP - General (General Practice)  CHIEF COMPLAINTS/PURPOSE OF CONSULTATION:  Thrombotic microangiopathy  HISTORY OF PRESENTING ILLNESS:   Sarah Washington is a wonderful 32 y.o. female who has been referred to [place] by Dr Mabeline Caras for evaluation and management of recently diagnosed acquired TTP/Thrombotic microangiopathy. She is accompanied today by her three children. The pt reports that she is doing well overall.   The pt reports that in January 2019 she began feeling weak, tired, and developed a newly heavy menstrual cycle. She also sites bruising easily that began occurring in January 2019 but denies nose bleeds, gum bleeds, and blood in the stools at the time. She notes that she first had labs to evaluate these symptoms She noted finger tip neuropathy, severe headache, abdominal pains, and tingling in her feet. She denies any infections at the time, PO contraceptives, hormone therapy, and new medications at the time. She also denies recent blood transfusions as well. She was discharged with Atovaquone (for PCP prophylaxis). 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  The pt presented to [facility] on 11/02/17 for TTP.  Her last plasma exchange was on 11/07/17. She received 5 total plasma exchanges. She received her third Rituxan infusion on 11/15/17, and only notes some mild itching but denies any rashes with her infusion. She has not yet received her fourth dose or Rituxan as she has moved from [facility] to [place]. She has been taking 30mg  Prednisone since 11/15/17.  She adds that she has gained 20 pounds of weight, noting that she has a very strong appetite while taking steroids.    The pt has previously taken BP medication for her  HTN.  She received 5 units of blood, and no plasma exchange after delivery of her third child was premature at 5 months due to HELLP syndrome.   Most recent lab results (11/15/17) of CBC  is as follows: all values are WNL except for WBC at 12.4k, RBC at 3.69, HGB at 11.8, RDW at 22.3, MPV at 6.9, PLT at 137k.  On review of systems, pt reports increased appetite, weight gain, normalized menstrual bleeding, and denies headaches, peripheral neuropathy, fevers, chills, night sweats, back pains, abdominal pains, changes in bowel habits, and any other symptoms.   On PMHx the pt reports HTN, tubal ligation in 2010, HELLP syndrome with third pregnancy requiring delivery after 5 months. Angioedema reaction to sulfa drugs.  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  On Family Hx the pt denies any blood, cancer, or autoimmune disorders.  Interval History:    [name] returns today regarding management and evaluation of her TTP. The patient's last visit with [place] was on 12/10/17. The pt reports that she is doing well overall. She is eating more recently. She is down to 10mg  prednisone now. She note she is trying to drink more water. 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  Labs today stable with nl PLT counts at 264k and hgb stable at 11.6 On review of symptoms, pt denies swelling or skin rashes.     MEDICAL HISTORY:   HTN, tubal ligation in 2010, HELLP syndrome with third pregnancy requiring delivery after 5 months. Angioedema reaction to sulfa drugs.  SURGICAL HISTORY: tubal ligation in 2010  SOCIAL HISTORY: Social History   Socioeconomic History  . Marital status: Single    Spouse name: Not on file  . Number of children: Not on file  . Years of education: Not on file  . Highest education level: Not on file  Occupational History  . Not on file  Social Needs  . Financial resource strain: Not on file  . Food insecurity:    Worry: Not on  file    Inability: Not on file  . Transportation needs:    Medical: Not on file    Non-medical: Not on file  Tobacco Use  . Smoking status: Former Smoker    Types: Cigarettes    Last attempt to quit: 10/2017    Years since quitting: 0.2  . Smokeless tobacco: Never Used  Substance and Sexual Activity  . Alcohol use: Yes  . Drug use: Never  . Sexual activity: Yes  Lifestyle  . Physical activity:    Days per week: Not on file    Minutes per session: Not on file  . Stress: Not on file  Relationships  . Social connections:    Talks on phone: Not on file    Gets together: Not on file    Attends religious service: Not on file    Active member of club or organization: Not on file    Attends meetings of clubs or organizations: Not on file    Relationship status: Not on file  . Intimate partner violence:    Fear of current or ex partner: Not on file    Emotionally abused: Not on file    Physically abused: Not on file    Forced sexual activity: Not on file  Other Topics Concern  . Not on file  Social History Narrative  . Not on file    FAMILY HISTORY: Family History  Problem Relation Age of Onset  . Hypertension Mother   . Diabetes Mother   . Diabetes Father   . Hypertension Father     ALLERGIES:  is allergic to sulfa antibiotics.  MEDICATIONS:  Current Outpatient Medications  Medication Sig Dispense Refill  . folic acid (FOLVITE) 1 MG tablet Take 1 tablet (1 mg total) by mouth daily. 60 tablet 2  . pantoprazole (PROTONIX) 40 MG tablet Take 40 mg by mouth daily.    . predniSONE (DELTASONE) 10 MG tablet 20mg  po daily for 1 week then 10mg  po daily till futher taper instructions     No current facility-administered medications for this visit.     REVIEW OF SYSTEMS:    A 10+ POINT REVIEW OF SYSTEMS WAS OBTAINED including neurology, dermatology, psychiatry, cardiac, respiratory, lymph, extremities, GI, GU, Musculoskeletal, constitutional, breasts, reproductive, HEENT.   All pertinent positives are noted in the HPI.  All others are negative.   PHYSICAL EXAMINATION:  Vitals:   12/25/17 1502  BP: (!) 135/96  Pulse: (!) 112  Resp: 18  Temp: 98.6 F (37 C)  SpO2: 100%   Filed Weights   12/25/17 1502  Weight: 217 lb 12.8 oz (98.8 kg)   .Body mass index is 34.11 kg/m.  GENERAL:alert, in no acute distress and comfortable SKIN: no acute rashes, no significant lesions EYES: conjunctiva are pink and non-injected, sclera anicteric OROPHARYNX: MMM, no exudates, no oropharyngeal erythema or ulceration NECK: supple, no JVD LYMPH:  no palpable lymphadenopathy in the cervical, axillary or inguinal regions LUNGS: clear to auscultation b/l with normal respiratory effort HEART: regular rate &  rhythm ABDOMEN:  normoactive bowel sounds , non tender, not distended. No palpable hepatosplenomegaly.  Extremity: no pedal edema PSYCH: alert & oriented x 3 with fluent speech NEURO: no focal motor/sensory deficits   LABORATORY DATA:  I have reviewed the data as listed     11/15/17      . CBC Latest Ref Rng & Units 12/31/2017 12/25/2017 12/17/2017  WBC 3.9 - 10.3 K/uL 10.5(H) 11.7(H) 13.2(H)  Hemoglobin 11.6 - 15.9 g/dL 11.6 13.0 11.6  Hematocrit 34.8 - 46.6 % 34.4(L) 39.1 34.3(L)  Platelets 145 - 400 K/uL 264 313 333    . CMP Latest Ref Rng & Units 12/31/2017 12/25/2017 12/17/2017  Glucose 70 - 99 mg/dL 110(H) 132(H) 111(H)  BUN 6 - 20 mg/dL 14 21(H) 19  Creatinine 0.44 - 1.00 mg/dL 0.81 0.95 1.33(H)  Sodium 135 - 145 mmol/L 140 139 140  Potassium 3.5 - 5.1 mmol/L 3.9 4.6 3.7  Chloride 98 - 111 mmol/L 106 107 107  CO2 22 - 32 mmol/L 26 24 25   Calcium 8.9 - 10.3 mg/dL 9.2 9.3 9.3  Total Protein 6.5 - 8.1 g/dL 7.6 7.6 7.7  Total Bilirubin 0.3 - 1.2 mg/dL <0.2(L) <0.2(L) <0.2(L)  Alkaline Phos 38 - 126 U/L 54 65 58  AST 15 - 41 U/L 15 14(L) 12(L)  ALT 0 - 44 U/L 15 17 17    . Lab Results  Component Value Date   LDH 207 (H) 12/31/2017   Component      Latest Ref Rng & Units 11/26/2017  Adamts 13 Activity     >66.8 % 69.0  ADAMTS13 Activity comment      Comment    RADIOGRAPHIC STUDIES: I have personally reviewed the radiological images as listed and agreed with the findings in the report. No results found.  ASSESSMENT & PLAN:   32 y.o. female with  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  Severely low ADAMTS 13 levels of 6 on diagnosis. Now normalized to 69 ( ADAMTS13 from 11/26/17 ) S/p Plasmapheresis x 5. Completed Rituxan weekly x 4 doses.  PLAN:   -Discussed pt labwork from; WBC at 11.7, PLT normal at 313K, ANC at 6.8 and Lymphocytes at 4.1, CMP wnl --no lab or clinical evidence of relapse of TTP at this time -Continue Prednisone taper with 5mg  once daily for 1 week then 5mg  every other day for 1 week then stop Prednisone  -continue B complex and folic acid. Based on next month's lab she may be able to reduce or stop supplements.  -Labs in 2 weeks RTC with Dr Irene Limbo in 4 weeks with labs  2. RUE swelling -Right Upper Extremity Venous US from 12/10/17 showed no evidence of DVT Final Interpretation: Right: No evidence of deep vein thrombosis in the upper extremity. No evidence of superficial vein thrombosis in the upper extremity. Left: No evidence of thrombosis in the subclavian.   Labs in 2 weeks RTC with Dr Irene Limbo in 4 weeks with labs   All of the patients questions were answered with apparent satisfaction. The patient knows to call the clinic with any problems, questions or concerns.  . The total time spent in the appointment was 20 minutes and more than 50% was on counseling and direct patient cares.     Sullivan Lone MD [place] AAHIVMS [facility] Hematology/Oncology Physician [facility]  (Office):       [phone] (Work cell):  [phone] (Fax):  [phone]  12/25/2017 3:10 PM  I, Joslyn Devon, am acting as scribe for Sullivan Lone, MD.    .I have reviewed the  above documentation for accuracy and completeness, and I agree with the above.   Brunetta Genera MD

## 2017-12-25 NOTE — Telephone Encounter (Signed)
Gave patient avs and calendar of upcoming aug appts.  °

## 2017-12-31 ENCOUNTER — Inpatient Hospital Stay: Payer: Medicaid Other

## 2017-12-31 ENCOUNTER — Other Ambulatory Visit: Payer: Medicaid Other

## 2017-12-31 DIAGNOSIS — Z7189 Other specified counseling: Secondary | ICD-10-CM

## 2017-12-31 DIAGNOSIS — M3119 Other thrombotic microangiopathy: Secondary | ICD-10-CM

## 2017-12-31 DIAGNOSIS — M311 Thrombotic microangiopathy: Secondary | ICD-10-CM

## 2017-12-31 LAB — CBC WITH DIFFERENTIAL/PLATELET
Basophils Absolute: 0 10*3/uL (ref 0.0–0.1)
Basophils Relative: 0 %
EOS ABS: 0.1 10*3/uL (ref 0.0–0.5)
EOS PCT: 1 %
HCT: 34.4 % — ABNORMAL LOW (ref 34.8–46.6)
Hemoglobin: 11.6 g/dL (ref 11.6–15.9)
LYMPHS ABS: 3.7 10*3/uL — AB (ref 0.9–3.3)
Lymphocytes Relative: 36 %
MCH: 30.7 pg (ref 25.1–34.0)
MCHC: 33.7 g/dL (ref 31.5–36.0)
MCV: 91 fL (ref 79.5–101.0)
MONO ABS: 0.4 10*3/uL (ref 0.1–0.9)
MONOS PCT: 4 %
Neutro Abs: 6.2 10*3/uL (ref 1.5–6.5)
Neutrophils Relative %: 59 %
Platelets: 264 10*3/uL (ref 145–400)
RBC: 3.78 MIL/uL (ref 3.70–5.45)
RDW: 14.7 % — AB (ref 11.2–14.5)
WBC: 10.5 10*3/uL — ABNORMAL HIGH (ref 3.9–10.3)

## 2017-12-31 LAB — CMP (CANCER CENTER ONLY)
ALK PHOS: 54 U/L (ref 38–126)
ALT: 15 U/L (ref 0–44)
ANION GAP: 8 (ref 5–15)
AST: 15 U/L (ref 15–41)
Albumin: 3.6 g/dL (ref 3.5–5.0)
BUN: 14 mg/dL (ref 6–20)
CALCIUM: 9.2 mg/dL (ref 8.9–10.3)
CO2: 26 mmol/L (ref 22–32)
Chloride: 106 mmol/L (ref 98–111)
Creatinine: 0.81 mg/dL (ref 0.44–1.00)
GFR, Estimated: >60 mL/min (ref >60)
Glucose, Bld: 110 mg/dL — ABNORMAL HIGH (ref 70–99)
Potassium: 3.9 mmol/L (ref 3.5–5.1)
Sodium: 140 mmol/L (ref 135–145)
TOTAL PROTEIN: 7.6 g/dL (ref 6.5–8.1)

## 2017-12-31 LAB — RETICULOCYTES
RBC.: 3.78 MIL/uL (ref 3.70–5.45)
RETIC CT PCT: 1.8 % (ref 0.7–2.1)
Retic Count, Absolute: 68 10*3/uL (ref 33.7–90.7)

## 2017-12-31 LAB — LACTATE DEHYDROGENASE: LDH: 207 U/L — ABNORMAL HIGH (ref 98–192)

## 2018-01-02 ENCOUNTER — Telehealth: Payer: Self-pay | Admitting: Hematology

## 2018-01-02 NOTE — Telephone Encounter (Signed)
Patient asked to cancel lab appt on 8/6 because she was scheduled for 2 lab appts in error.

## 2018-01-07 ENCOUNTER — Inpatient Hospital Stay: Payer: Medicaid Other | Attending: Hematology

## 2018-01-07 ENCOUNTER — Other Ambulatory Visit: Payer: Medicaid Other

## 2018-01-07 DIAGNOSIS — B373 Candidiasis of vulva and vagina: Secondary | ICD-10-CM | POA: Insufficient documentation

## 2018-01-07 DIAGNOSIS — L732 Hidradenitis suppurativa: Secondary | ICD-10-CM | POA: Insufficient documentation

## 2018-01-07 DIAGNOSIS — M311 Thrombotic microangiopathy: Secondary | ICD-10-CM | POA: Insufficient documentation

## 2018-01-08 ENCOUNTER — Other Ambulatory Visit: Payer: Medicaid Other

## 2018-01-09 ENCOUNTER — Telehealth: Payer: Self-pay

## 2018-01-09 ENCOUNTER — Inpatient Hospital Stay (HOSPITAL_BASED_OUTPATIENT_CLINIC_OR_DEPARTMENT_OTHER): Payer: Medicaid Other | Admitting: Medical

## 2018-01-09 ENCOUNTER — Inpatient Hospital Stay: Payer: Medicaid Other

## 2018-01-09 VITALS — BP 150/107 | HR 95 | Temp 99.3°F | Resp 18 | Ht 67.0 in | Wt 221.0 lb

## 2018-01-09 DIAGNOSIS — B373 Candidiasis of vulva and vagina: Secondary | ICD-10-CM | POA: Diagnosis not present

## 2018-01-09 DIAGNOSIS — B3731 Acute candidiasis of vulva and vagina: Secondary | ICD-10-CM

## 2018-01-09 DIAGNOSIS — L732 Hidradenitis suppurativa: Secondary | ICD-10-CM

## 2018-01-09 DIAGNOSIS — M311 Thrombotic microangiopathy: Secondary | ICD-10-CM

## 2018-01-09 DIAGNOSIS — M3119 Other thrombotic microangiopathy: Secondary | ICD-10-CM

## 2018-01-09 LAB — RETICULOCYTES
RBC.: 3.72 MIL/uL (ref 3.70–5.45)
RETIC CT PCT: 2.3 % — AB (ref 0.7–2.1)
Retic Count, Absolute: 85.6 10*3/uL (ref 33.7–90.7)

## 2018-01-09 LAB — CBC WITH DIFFERENTIAL/PLATELET
BASOS ABS: 0 10*3/uL (ref 0.0–0.1)
Basophils Relative: 0 %
EOS PCT: 0 %
Eosinophils Absolute: 0 10*3/uL (ref 0.0–0.5)
HEMATOCRIT: 33.2 % — AB (ref 34.8–46.6)
Hemoglobin: 11.3 g/dL — ABNORMAL LOW (ref 11.6–15.9)
LYMPHS ABS: 2.7 10*3/uL (ref 0.9–3.3)
Lymphocytes Relative: 15 %
MCH: 30.4 pg (ref 25.1–34.0)
MCHC: 34 g/dL (ref 31.5–36.0)
MCV: 89.2 fL (ref 79.5–101.0)
MONO ABS: 0.6 10*3/uL (ref 0.1–0.9)
MONOS PCT: 4 %
NEUTROS ABS: 14.7 10*3/uL — AB (ref 1.5–6.5)
Neutrophils Relative %: 81 %
PLATELETS: 354 10*3/uL (ref 145–400)
RBC: 3.72 MIL/uL (ref 3.70–5.45)
RDW: 14.1 % (ref 11.2–14.5)
WBC: 18 10*3/uL — ABNORMAL HIGH (ref 3.9–10.3)

## 2018-01-09 LAB — CMP (CANCER CENTER ONLY)
ALBUMIN: 3.6 g/dL (ref 3.5–5.0)
ALT: 11 U/L (ref 0–44)
ANION GAP: 11 (ref 5–15)
AST: 11 U/L — AB (ref 15–41)
Alkaline Phosphatase: 54 U/L (ref 38–126)
BUN: 15 mg/dL (ref 6–20)
CHLORIDE: 106 mmol/L (ref 98–111)
CO2: 22 mmol/L (ref 22–32)
Calcium: 9.3 mg/dL (ref 8.9–10.3)
Creatinine: 0.9 mg/dL (ref 0.44–1.00)
GFR, Est AFR Am: >60 mL/min (ref >60)
GFR, Estimated: >60 mL/min (ref >60)
Glucose, Bld: 109 mg/dL — ABNORMAL HIGH (ref 70–99)
POTASSIUM: 3.9 mmol/L (ref 3.5–5.1)
SODIUM: 139 mmol/L (ref 135–145)
TOTAL PROTEIN: 7.7 g/dL (ref 6.5–8.1)

## 2018-01-09 MED ORDER — AMOXICILLIN-POT CLAVULANATE 875-125 MG PO TABS: 1.0000 | ORAL_TABLET | Freq: Two times a day (BID) | ORAL | 0 refills | Status: DC

## 2018-01-09 MED ORDER — FLUCONAZOLE 150 MG PO TABS: ORAL_TABLET | ORAL | 0 refills | Status: DC

## 2018-01-09 NOTE — Telephone Encounter (Signed)
Patient called and stated she is tapering her prednisone as prescribed. Patient stated that she has recently developed a "Boil" in her groin and on her butt cheek. Patient also stated that she has some vaginal discharge. Dr. Irene Limbo made aware. Patient to see Sandi Mealy in symptom management. Scheduling message sent.

## 2018-01-09 NOTE — Telephone Encounter (Signed)
Patient is aware of 3:00pm appointment today.

## 2018-01-09 NOTE — Telephone Encounter (Signed)
Per 8/6 sch msg from [place] to schedule 30 min with Sandi Mealy, and a lab. She will contact patient. Patient and Lucianne Lei is aware of the appointment

## 2018-01-09 NOTE — Progress Notes (Signed)
Pt presents with boils under armpits, around groin, and on buttocks.  Afebrile.  Denies recent injury or changes in allergies/environment.  Boils are tender to touch, and have busted with smelly pus.

## 2018-01-11 NOTE — Progress Notes (Signed)
Symptoms Management Clinic Progress Note   Sarah Washington [id number] [birth year] 32 y.o.  Sarah Washington is managed by Dr. Sullivan Lone  Actively treated with chemotherapy/immunotherapy: no  Assessment: Plan:    Hidradenitis suppurativa - Plan: amoxicillin-clavulanate (AUGMENTIN) 875-125 MG tablet, fluconazole (DIFLUCAN) 150 MG tablet  TTP (thrombotic thrombocytopenic purpura) (HCC)  Vaginal candidiasis   Hidradenitis suppurative: The patient has developed a flare of her hidradenitis suppurative as she is weaning off of prednisone.  She was given a prescription for Augmentin 875-125 p.o. twice daily.    Vaginal candidiasis: The patient was given a prescription for Diflucan 150 mg with 2 tablets dispense.  TTP: The patient continues to be followed by Dr. Irene Limbo.  She is weaning off of prednisone.  A CBC returned today with a platelet count of 354.  She is scheduled to see Dr. Irene Limbo in follow-up on 01/21/2018.  Please see After Visit Summary for patient specific instructions.  Future Appointments  Date Time Provider [place]  01/21/2018  2:30 PM [facility] 5 [facility] None  01/21/2018  3:00 PM Brunetta Genera, MD [facility] None    No orders of the defined types were placed in this encounter.      Subjective:   Patient ID:  Sarah Washington is a 32 y.o. (DOB 03-24-1986) female.  Chief Complaint:  Chief Complaint  Patient presents with  . Recurrent Skin Infections    HPI [name] is a 32 year old female with a history of TTP who is managed by Dr. Sullivan Lone and has been treated previously with prednisone.  She has a history of hidradenitis suppurative and has noted development of a draining abscess in her left axillary area and has had painful cyst on her buttock and closed to her groin which ruptured and drained foul-smelling purulent material.  She denies fevers, chills, or sweats.  Medications: I have  reviewed the patient's current medications.  Allergies:  Allergies  Allergen Reactions  . Sulfa Antibiotics     No past medical history on file.    Family History  Problem Relation Age of Onset  . Hypertension Mother   . Diabetes Mother   . Diabetes Father   . Hypertension Father     Social History   Socioeconomic History  . Marital status: Single    Spouse name: Not on file  . Number of children: Not on file  . Years of education: Not on file  . Highest education level: Not on file  Occupational History  . Not on file  Social Needs  . Financial resource strain: Not on file  . Food insecurity:    Worry: Not on file    Inability: Not on file  . Transportation needs:    Medical: Not on file    Non-medical: Not on file  Tobacco Use  . Smoking status: Former Smoker    Types: Cigarettes    Last attempt to quit: 10/2017    Years since quitting: 0.2  . Smokeless tobacco: Never Used  Substance and Sexual Activity  . Alcohol use: Yes  . Drug use: Never  . Sexual activity: Yes  Lifestyle  . Physical activity:    Days per week: Not on file    Minutes per session: Not on file  . Stress: Not on file  Relationships  . Social connections:    Talks on phone: Not on file    Gets together: Not on file    Attends religious service: Not on  file    Active member of club or organization: Not on file    Attends meetings of clubs or organizations: Not on file    Relationship status: Not on file  . Intimate partner violence:    Fear of current or ex partner: Not on file    Emotionally abused: Not on file    Physically abused: Not on file    Forced sexual activity: Not on file  Other Topics Concern  . Not on file  Social History Narrative  . Not on file    Past Medical History, Surgical history, Social history, and Family history were reviewed and updated as appropriate.   Please see review of systems for further details on the patient's review from today.   Review  of Systems:  Review of Systems  Constitutional: Negative for chills, diaphoresis and fever.  Skin: Positive for wound.    Objective:   Physical Exam:  BP (!) 150/107 (BP Location: Left Arm, Patient Position: Sitting) Comment: nurse aware of bp  Pulse 95   Temp 99.3 F (37.4 C) (Oral)   Resp 18   Ht 5\' 7"  (1.702 m)   Wt 221 lb (100.2 kg)   SpO2 100%   BMI 34.61 kg/m  ECOG: 0  Physical Exam  Constitutional: No distress.  HENT:  Head: Normocephalic and atraumatic.  Neurological: She is alert. Coordination normal.  Skin: Skin is warm and dry. She is not diaphoretic. There is erythema.  The patient has an open wound in the left axilla which is not draining.   Psychiatric: She has a normal mood and affect. Her behavior is normal. Thought content normal.    Lab Review:     Component Value Date/Time   NA 139 01/09/2018 1507   K 3.9 01/09/2018 1507   CL 106 01/09/2018 1507   CO2 22 01/09/2018 1507   GLUCOSE 109 (H) 01/09/2018 1507   BUN 15 01/09/2018 1507   CREATININE 0.90 01/09/2018 1507   CALCIUM 9.3 01/09/2018 1507   PROT 7.7 01/09/2018 1507   ALBUMIN 3.6 01/09/2018 1507   AST 11 (L) 01/09/2018 1507   ALT 11 01/09/2018 1507   ALKPHOS 54 01/09/2018 1507   BILITOT <0.2 (L) 01/09/2018 1507   GFRNONAA >60 01/09/2018 1507   GFRAA >60 01/09/2018 1507       Component Value Date/Time   WBC 18.0 (H) 01/09/2018 1507   RBC 3.72 01/09/2018 1507   RBC 3.72 01/09/2018 1507   HGB 11.3 (L) 01/09/2018 1507   HCT 33.2 (L) 01/09/2018 1507   PLT 354 01/09/2018 1507   MCV 89.2 01/09/2018 1507   MCH 30.4 01/09/2018 1507   MCHC 34.0 01/09/2018 1507   RDW 14.1 01/09/2018 1507   LYMPHSABS 2.7 01/09/2018 1507   MONOABS 0.6 01/09/2018 1507   EOSABS 0.0 01/09/2018 1507   BASOSABS 0.0 01/09/2018 1507   -------------------------------  Imaging from last 24 hours (if applicable):  Radiology interpretation: No results found.

## 2018-01-21 ENCOUNTER — Inpatient Hospital Stay: Payer: Medicaid Other | Admitting: Hematology

## 2018-01-21 ENCOUNTER — Telehealth: Payer: Self-pay

## 2018-01-21 ENCOUNTER — Inpatient Hospital Stay: Payer: Medicaid Other

## 2018-01-21 NOTE — Telephone Encounter (Signed)
I called the patient informing her of her missed appointment at 3:00pm today.  I left a message advising her to call us back to reschedule.

## 2018-01-30 ENCOUNTER — Telehealth: Payer: Self-pay | Admitting: Hematology

## 2018-01-30 NOTE — Telephone Encounter (Signed)
Tried to reach regarding voicemail I did leave a message °

## 2018-04-02 ENCOUNTER — Telehealth: Payer: Self-pay | Admitting: *Deleted

## 2018-04-02 NOTE — Telephone Encounter (Signed)
Pt called requesting lab appt.  No future appts scheduled with Dr. Irene Limbo.  Pt was no show for August appts. Message sent to Dr. Irene Limbo for further instructions.

## 2018-04-02 NOTE — Telephone Encounter (Signed)
Per Dr. Irene Limbo, ok for labs if pt schedules MD visit as well.  Scheduling message sent.

## 2018-04-30 NOTE — Progress Notes (Signed)
HEMATOLOGY/ONCOLOGY CONSULTATION NOTE  Date of Service: 05/01/2018  Patient Care Team: Patient, No Pcp Per as PCP - General (General Practice)  CHIEF COMPLAINTS/PURPOSE OF CONSULTATION:  Thrombotic microangiopathy  HISTORY OF PRESENTING ILLNESS:   Sarah Washington is a wonderful 32 y.o. female who has been referred to [place] by Dr Mabeline Caras for evaluation and management of recently diagnosed acquired TTP/Thrombotic microangiopathy. She is accompanied today by her three children. The pt reports that she is doing well overall.   The pt reports that in January 2019 she began feeling weak, tired, and developed a newly heavy menstrual cycle. She also sites bruising easily that began occurring in January 2019 but denies nose bleeds, gum bleeds, and blood in the stools at the time. She notes that she first had labs to evaluate these symptoms She noted finger tip neuropathy, severe headache, abdominal pains, and tingling in her feet. She denies any infections at the time, PO contraceptives, hormone therapy, and new medications at the time. She also denies recent blood transfusions as well. She was discharged with Atovaquone (for PCP prophylaxis). 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  The pt presented to [facility] on 11/02/17 for TTP.  Her last plasma exchange was on 11/07/17. She received 5 total plasma exchanges. She received her third Rituxan infusion on 11/15/17, and only notes some mild itching but denies any rashes with her infusion. She has not yet received her fourth dose or Rituxan as she has moved from [facility] to [place]. She has been taking 30mg  Prednisone since 11/15/17.  She adds that she has gained 20 pounds of weight, noting that she has a very strong appetite while taking steroids.    The pt has previously taken BP medication for her  HTN.  She received 5 units of blood, and no plasma exchange after delivery of her third child was premature at 5 months due to HELLP syndrome.   Most recent lab results (11/15/17) of CBC  is as follows: all values are WNL except for WBC at 12.4k, RBC at 3.69, HGB at 11.8, RDW at 22.3, MPV at 6.9, PLT at 137k.  On review of systems, pt reports increased appetite, weight gain, normalized menstrual bleeding, and denies headaches, peripheral neuropathy, fevers, chills, night sweats, back pains, abdominal pains, changes in bowel habits, and any other symptoms.   On PMHx the pt reports HTN, tubal ligation in 2010, HELLP syndrome with third pregnancy requiring delivery after 5 months. Angioedema reaction to sulfa drugs.  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  On Family Hx the pt denies any blood, cancer, or autoimmune disorders.  Interval History:    [name] returns today regarding management and evaluation of her TTP. The patient's last visit with [place] was on 12/25/17, and subsequently missed follow up. She is accompanied today by her son. The pt reports that she is doing well overall.   The pt reports that she occasionally feels tight in her arms but does work as a Quarry manager. She does not have any pitting edema.   The pt notes that her periods have been heavy and have lasted longer. She was previously seeing an OBGYN in [facility] and notes that she was told in the past that she has fibroids.   Lab results today (05/01/18) of CBC w/diff, Reticulocytes is as follows: all values are  WNL except for WBC at 10.8k, RBC at 3.86, HGB at 10.7, HCT at 33.2, Immature retic fract at 18.2%. 05/01/18 CMP is pending 05/01/18 LDH is pending  On review of systems, pt reports heavy periods, feeling tired, and denies any other symptoms.   MEDICAL HISTORY:   HTN, tubal ligation in 2010, HELLP syndrome with third pregnancy requiring delivery after 5 months. Angioedema reaction to sulfa drugs.    SURGICAL HISTORY: tubal ligation in 2010  SOCIAL HISTORY: Social History   Socioeconomic History  . Marital status: Single    Spouse name: Not on file  . Number of children: Not on file  . Years of education: Not on file  . Highest education level: Not on file  Occupational History  . Not on file  Social Needs  . Financial resource strain: Not on file  . Food insecurity:    Worry: Not on file    Inability: Not on file  . Transportation needs:    Medical: Not on file    Non-medical: Not on file  Tobacco Use  . Smoking status: Former Smoker    Types: Cigarettes    Last attempt to quit: 10/2017    Years since quitting: 0.5  . Smokeless tobacco: Never Used  Substance and Sexual Activity  . Alcohol use: Yes  . Drug use: Never  . Sexual activity: Yes  Lifestyle  . Physical activity:    Days per week: Not on file    Minutes per session: Not on file  . Stress: Not on file  Relationships  . Social connections:    Talks on phone: Not on file    Gets together: Not on file    Attends religious service: Not on file    Active member of club or organization: Not on file    Attends meetings of clubs or organizations: Not on file    Relationship status: Not on file  . Intimate partner violence:    Fear of current or ex partner: Not on file    Emotionally abused: Not on file    Physically abused: Not on file    Forced sexual activity: Not on file  Other Topics Concern  . Not on file  Social History Narrative  . Not on file    FAMILY HISTORY: Family History  Problem Relation Age of Onset  . Hypertension Mother   . Diabetes Mother   . Diabetes Father   . Hypertension Father     ALLERGIES:  is allergic to sulfa antibiotics.  MEDICATIONS:  Current Outpatient Medications  Medication Sig Dispense Refill  . amoxicillin-clavulanate (AUGMENTIN) 875-125 MG tablet Take 1 tablet by mouth 2 (two) times daily. 20 tablet 0  . fluconazole (DIFLUCAN) 150 MG tablet Take 1  tablet for a yeast infection if needed, may repeat in 3 days if needed 2 tablet 0  . folic acid (FOLVITE) 1 MG tablet Take 1 tablet (1 mg total) by mouth daily. 60 tablet 2  . iron polysaccharides (NIFEREX) 150 MG capsule Take 1 capsule (150 mg total) by mouth daily. 30 capsule 3  . pantoprazole (PROTONIX) 40 MG tablet Take 40 mg by mouth daily.    . predniSONE (DELTASONE) 10 MG tablet 20mg  po daily for 1 week then 10mg  po daily till futher taper instructions     No current facility-administered medications for this visit.     REVIEW OF SYSTEMS:    A 10+ POINT REVIEW OF SYSTEMS WAS OBTAINED including neurology, dermatology, psychiatry, cardiac, respiratory,  lymph, extremities, GI, GU, Musculoskeletal, constitutional, breasts, reproductive, HEENT.  All pertinent positives are noted in the HPI.  All others are negative.   PHYSICAL EXAMINATION:  Vitals:   05/01/18 1519  BP: (!) 144/99  Pulse: (!) 110  Resp: 18  Temp: 98.9 F (37.2 C)  SpO2: 100%   Filed Weights   05/01/18 1519  Weight: 228 lb 3.2 oz (103.5 kg)   .Body mass index is 35.74 kg/m.  GENERAL:alert, in no acute distress and comfortable SKIN: no acute rashes, no significant lesions EYES: conjunctiva are pink and non-injected, sclera anicteric OROPHARYNX: MMM, no exudates, no oropharyngeal erythema or ulceration NECK: supple, no JVD LYMPH:  no palpable lymphadenopathy in the cervical, axillary or inguinal regions LUNGS: clear to auscultation b/l with normal respiratory effort HEART: regular rate & rhythm ABDOMEN:  normoactive bowel sounds , non tender, not distended. No palpable hepatosplenomegaly.  Extremity: no pedal edema PSYCH: alert & oriented x 3 with fluent speech NEURO: no focal motor/sensory deficits   LABORATORY DATA:  I have reviewed the data as listed     11/15/17      . CBC Latest Ref Rng & Units 05/01/2018 01/09/2018 12/31/2017  WBC 4.0 - 10.5 K/uL 10.8(H) 18.0(H) 10.5(H)  Hemoglobin 12.0 -  15.0 g/dL 10.7(L) 11.3(L) 11.6  Hematocrit 36.0 - 46.0 % 33.2(L) 33.2(L) 34.4(L)  Platelets 150 - 400 K/uL 353 354 264    . CMP Latest Ref Rng & Units 01/09/2018 12/31/2017 12/25/2017  Glucose 70 - 99 mg/dL 109(H) 110(H) 132(H)  BUN 6 - 20 mg/dL 15 14 21(H)  Creatinine 0.44 - 1.00 mg/dL 0.90 0.81 0.95  Sodium 135 - 145 mmol/L 139 140 139  Potassium 3.5 - 5.1 mmol/L 3.9 3.9 4.6  Chloride 98 - 111 mmol/L 106 106 107  CO2 22 - 32 mmol/L 22 26 24   Calcium 8.9 - 10.3 mg/dL 9.3 9.2 9.3  Total Protein 6.5 - 8.1 g/dL 7.7 7.6 7.6  Total Bilirubin 0.3 - 1.2 mg/dL <0.2(L) <0.2(L) <0.2(L)  Alkaline Phos 38 - 126 U/L 54 54 65  AST 15 - 41 U/L 11(L) 15 14(L)  ALT 0 - 44 U/L 11 15 17    . Lab Results  Component Value Date   LDH 207 (H) 12/31/2017   Component     Latest Ref Rng & Units 11/26/2017  Adamts 13 Activity     >66.8 % 69.0  ADAMTS13 Activity comment      Comment    RADIOGRAPHIC STUDIES: I have personally reviewed the radiological images as listed and agreed with the findings in the report. No results found.  ASSESSMENT & PLAN:   32 y.o. female with  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  Severely low ADAMTS 13 levels of 6 on diagnosis. Now normalized to 69 ( ADAMTS13 from 11/26/17 ) S/p Plasmapheresis x 5. Completed Rituxan weekly x 4 doses.  PLAN:  -Discussed pt labwork today, 05/01/18; PLT are normal at 353k, some anemia with HGB at 10.7, mild leukocytosis with WBC at 10.8k -04/2718 CMP and LDH are pending -Advised that pt establish care with a PCP and an OBGYN  -Stressed the importance of the pt begin 150mg  Iron Polysaccharide as she is having heavier periods. Recommend taking with orange juice -No evidence of TTP relapse at this time  -continue B complex and folic acid. -Will see the pt back in 2 months  2. RUE swelling -Right Upper Extremity Venous US from 12/10/17 showed no evidence of DVT Final Interpretation:  Right: No evidence of deep  vein thrombosis in the upper extremity. No evidence of superficial vein thrombosis in the upper extremity. Left: No evidence of thrombosis in the subclavian.   RTC with Dr Irene Limbo in 2 months with labs    All of the patients questions were answered with apparent satisfaction. The patient knows to call the clinic with any problems, questions or concerns.  The total time spent in the appt was 25 minutes and more than 50% was on counseling and direct patient cares.    Sullivan Lone MD MS AAHIVMS [facility] Hematology/Oncology Physician [facility]  (Office):       [phone] (Work cell):  [phone] (Fax):           [phone]  05/01/2018 3:35 PM  I, Baldwin Jamaica, am acting as a scribe for Dr. Sullivan Lone.   .I have reviewed the above documentation for accuracy and completeness, and I agree with the above. Brunetta Genera MD

## 2018-05-01 ENCOUNTER — Inpatient Hospital Stay: Payer: Medicaid Other

## 2018-05-01 ENCOUNTER — Inpatient Hospital Stay: Payer: Medicaid Other | Attending: Hematology | Admitting: Hematology

## 2018-05-01 ENCOUNTER — Telehealth: Payer: Self-pay

## 2018-05-01 VITALS — BP 144/99 | HR 110 | Temp 98.9°F | Resp 18 | Ht 67.0 in | Wt 228.2 lb

## 2018-05-01 DIAGNOSIS — M3119 Other thrombotic microangiopathy: Secondary | ICD-10-CM

## 2018-05-01 DIAGNOSIS — D509 Iron deficiency anemia, unspecified: Secondary | ICD-10-CM

## 2018-05-01 DIAGNOSIS — Z87891 Personal history of nicotine dependence: Secondary | ICD-10-CM

## 2018-05-01 DIAGNOSIS — D72829 Elevated white blood cell count, unspecified: Secondary | ICD-10-CM | POA: Insufficient documentation

## 2018-05-01 DIAGNOSIS — N920 Excessive and frequent menstruation with regular cycle: Secondary | ICD-10-CM | POA: Insufficient documentation

## 2018-05-01 DIAGNOSIS — M311 Thrombotic microangiopathy: Secondary | ICD-10-CM

## 2018-05-01 LAB — CBC WITH DIFFERENTIAL/PLATELET
Abs Immature Granulocytes: 0.02 10*3/uL (ref 0.00–0.07)
BASOS ABS: 0 10*3/uL (ref 0.0–0.1)
Basophils Relative: 0 %
Eosinophils Absolute: 0.1 10*3/uL (ref 0.0–0.5)
Eosinophils Relative: 1 %
HCT: 33.2 % — ABNORMAL LOW (ref 36.0–46.0)
HEMOGLOBIN: 10.7 g/dL — AB (ref 12.0–15.0)
IMMATURE GRANULOCYTES: 0 %
LYMPHS ABS: 3.6 10*3/uL (ref 0.7–4.0)
LYMPHS PCT: 33 %
MCH: 27.7 pg (ref 26.0–34.0)
MCHC: 32.2 g/dL (ref 30.0–36.0)
MCV: 86 fL (ref 80.0–100.0)
Monocytes Absolute: 0.6 10*3/uL (ref 0.1–1.0)
Monocytes Relative: 6 %
NEUTROS PCT: 60 %
Neutro Abs: 6.5 10*3/uL (ref 1.7–7.7)
Platelets: 353 10*3/uL (ref 150–400)
RBC: 3.86 MIL/uL — ABNORMAL LOW (ref 3.87–5.11)
RDW: 13 % (ref 11.5–15.5)
WBC: 10.8 10*3/uL — ABNORMAL HIGH (ref 4.0–10.5)
nRBC: 0 % (ref 0.0–0.2)

## 2018-05-01 LAB — CMP (CANCER CENTER ONLY)
ALT: 10 U/L (ref 0–44)
ANION GAP: 9 (ref 5–15)
AST: 14 U/L — ABNORMAL LOW (ref 15–41)
Albumin: 3.4 g/dL — ABNORMAL LOW (ref 3.5–5.0)
Alkaline Phosphatase: 59 U/L (ref 38–126)
BUN: 12 mg/dL (ref 6–20)
CHLORIDE: 108 mmol/L (ref 98–111)
CO2: 24 mmol/L (ref 22–32)
Calcium: 8.8 mg/dL — ABNORMAL LOW (ref 8.9–10.3)
Creatinine: 1.05 mg/dL — ABNORMAL HIGH (ref 0.44–1.00)
GFR, Estimated: >60 mL/min (ref >60)
Glucose, Bld: 88 mg/dL (ref 70–99)
Potassium: 3.8 mmol/L (ref 3.5–5.1)
Sodium: 141 mmol/L (ref 135–145)
Total Bilirubin: <0.2 mg/dL — ABNORMAL LOW (ref 0.3–1.2)
Total Protein: 7.7 g/dL (ref 6.5–8.1)

## 2018-05-01 LAB — RETICULOCYTES
Immature Retic Fract: 18.2 % — ABNORMAL HIGH (ref 2.3–15.9)
RBC.: 3.86 MIL/uL — AB (ref 3.87–5.11)
RETIC COUNT ABSOLUTE: 87.6 10*3/uL (ref 19.0–186.0)
Retic Ct Pct: 2.3 % (ref 0.4–3.1)

## 2018-05-01 LAB — LACTATE DEHYDROGENASE: LDH: 128 U/L (ref 98–192)

## 2018-05-01 MED ORDER — POLYSACCHARIDE IRON COMPLEX 150 MG PO CAPS: 150.0000 mg | ORAL_CAPSULE | Freq: Every day | ORAL | 3 refills | Status: DC

## 2018-05-01 NOTE — Telephone Encounter (Signed)
Printed avs and calender of upcoming appointment. Per 11/27 los 

## 2018-06-04 ENCOUNTER — Encounter: Payer: Self-pay | Admitting: Family Medicine

## 2018-06-06 ENCOUNTER — Encounter: Payer: Self-pay | Admitting: Family Medicine

## 2018-06-06 ENCOUNTER — Ambulatory Visit (INDEPENDENT_AMBULATORY_CARE_PROVIDER_SITE_OTHER): Payer: Medicaid Other | Admitting: Family Medicine

## 2018-06-06 VITALS — BP 142/98 | HR 95 | Resp 17 | Ht 67.0 in | Wt 229.4 lb

## 2018-06-06 DIAGNOSIS — Z7689 Persons encountering health services in other specified circumstances: Secondary | ICD-10-CM

## 2018-06-06 DIAGNOSIS — M311 Thrombotic microangiopathy: Secondary | ICD-10-CM

## 2018-06-06 DIAGNOSIS — I10 Essential (primary) hypertension: Secondary | ICD-10-CM | POA: Diagnosis not present

## 2018-06-06 DIAGNOSIS — M3119 Other thrombotic microangiopathy: Secondary | ICD-10-CM

## 2018-06-06 DIAGNOSIS — L732 Hidradenitis suppurativa: Secondary | ICD-10-CM

## 2018-06-06 MED ORDER — HYDROCHLOROTHIAZIDE 25 MG PO TABS: 25.0000 mg | ORAL_TABLET | Freq: Every day | ORAL | 3 refills | Status: DC

## 2018-06-06 MED ORDER — DOXYCYCLINE HYCLATE 100 MG PO CAPS: 100.0000 mg | ORAL_CAPSULE | Freq: Every day | ORAL | 1 refills | Status: DC

## 2018-06-06 MED ORDER — MUPIROCIN 2 % EX OINT: 1.0000 "application " | TOPICAL_OINTMENT | Freq: Three times a day (TID) | CUTANEOUS | 2 refills | Status: DC

## 2018-06-06 NOTE — Patient Instructions (Addendum)
Thank you for choosing Primary Care at [facility] to be your medical home!    Sarah Washington was seen by Molli Barrows, FNP today.   Sarah Washington's primary care provider is Scot Jun, FNP.   For the best care possible, you should try to see Molli Barrows, FNP-C whenever you come to the clinic.   We look forward to seeing you again soon!  If you have any questions about your visit today, please call us at [phone] or feel free to reach your primary care provider via [place].     Hidradenitis Suppurativa Hidradenitis suppurativa is a long-term (chronic) skin disease. It is similar to a severe form of acne, but it affects areas of the body where acne would be unusual, especially areas of the body where skin rubs against skin and becomes moist. These include:  Underarms.  Groin.  Genital area.  Buttocks.  Upper thighs.  Breasts. Hidradenitis suppurativa may start out as small lumps or pimples caused by blocked sweat glands or hair follicles. Pimples may develop into deep sores that break open (rupture) and drain pus. Over time, affected areas of skin may thicken and become scarred. This condition is rare and does not spread from person to person (non-contagious). What are the causes? The exact cause of this condition is not known. It may be related to:  Female and female hormones.  An overactive disease-fighting system (immune system). The immune system may over-react to blocked hair follicles or sweat glands and cause swelling and pus-filled sores. What increases the risk? You are more likely to develop this condition if you:  Are female.  Are 85-35 years old.  Have a family history of hidradenitis suppurativa.  Have a personal history of acne.  Are overweight.  Smoke.  Take the medicine lithium. What are the signs or symptoms? The first symptoms are usually painful bumps in the skin, similar to pimples. The condition may get  worse over time (progress), or it may only cause mild symptoms. If the disease progresses, symptoms may include:  Skin bumps getting bigger and growing deeper into the skin.  Bumps rupturing and draining pus.  Itchy, infected skin.  Skin getting thicker and scarred.  Tunnels under the skin (fistulas) where pus drains from a bump.  Pain during daily activities, such as pain during walking if your groin area is affected.  Emotional problems, such as stress or depression. This condition may affect your appearance and your ability or willingness to wear certain clothes or do certain activities. How is this diagnosed? This condition is diagnosed by a health care provider who specializes in skin diseases (dermatologist). You may be diagnosed based on:  Your symptoms and medical history.  A physical exam.  Testing a pus sample for infection.  Blood tests. How is this treated? Your treatment will depend on how severe your symptoms are. The same treatment will not work for everybody with this condition. You may need to try several treatments to find what works best for you. Treatment may include:  Cleaning and bandaging (dressing) your wounds as needed.  Lifestyle changes, such as new skin care routines.  Taking medicines, such as: ? Antibiotics. ? Acne medicines. ? Medicines to reduce the activity of the immune system. ? A diabetes medicine (metformin). ? Birth control pills, for women. ? Steroids to reduce swelling and pain.  Working with a mental health care provider, if you experience emotional distress due to this condition. If you have severe symptoms  that do not get better with medicine, you may need surgery. Surgery may involve:  Using a laser to clear the skin and remove hair follicles.  Opening and draining deep sores.  Removing the areas of skin that are diseased and scarred. Follow these instructions at home: Medicines   Take over-the-counter and prescription  medicines only as told by your health care provider.  If you were prescribed an antibiotic medicine, take it as told by your health care provider. Do not stop taking the antibiotic even if your condition improves. Skin care  If you have open wounds, cover them with a clean dressing as told by your health care provider. Keep wounds clean by washing them gently with soap and water when you bathe.  Do not shave the areas where you get hidradenitis suppurativa.  Do not wear deodorant.  Wear loose-fitting clothes.  Try to avoid getting overheated or sweaty. If you get sweaty or wet, change into clean, dry clothes as soon as you can.  To help relieve pain and itchiness, cover sore areas with a warm, clean washcloth (warm compress) for 5-10 minutes as often as needed.  If told by your health care provider, take a bleach bath twice a week: ? Fill your bathtub halfway with water. ? Pour in  cup of unscented household bleach. ? Soak in the tub for 5-10 minutes. ? Only soak from the neck down. Avoid water on your face and hair. ? Shower to rinse off the bleach from your skin. General instructions  Learn as much as you can about your disease so that you have an active role in your treatment. Work closely with your health care provider to find treatments that work for you.  If you are overweight, work with your health care provider to lose weight as recommended.  Do not use any products that contain nicotine or tobacco, such as cigarettes and e-cigarettes. If you need help quitting, ask your health care provider.  If you struggle with living with this condition, talk with your health care provider or work with a mental health care provider as recommended.  Keep all follow-up visits as told by your health care provider. This is important. Where to find more information  Hidradenitis [place].: [email or web address]-foundation.org/ Contact a health care provider if you have:  A  flare-up of hidradenitis suppurativa.  A fever or chills.  Trouble controlling your symptoms at home.  Trouble doing your daily activities because of your symptoms.  Trouble dealing with emotional problems related to your condition. Summary  Hidradenitis suppurativa is a long-term (chronic) skin disease. It is similar to a severe form of acne, but it affects areas of the body where acne would be unusual.  The first symptoms are usually painful bumps in the skin, similar to pimples. The condition may get worse over time (progress), or it may only cause mild symptoms.  If you have open wounds, cover them with a clean dressing as told by your health care provider. Keep wounds clean by washing them gently with soap and water when you bathe.  Besides skin care, treatment may include medicines, laser treatment, and surgery. This information is not intended to replace advice given to you by your health care provider. Make sure you discuss any questions you have with your health care provider. Document Released: 01/04/2004 Document Revised: 05/30/2017 Document Reviewed: 05/30/2017 Elsevier Interactive Patient Education  2019 Else[employer] Inc.    Hypertension Hypertension, commonly called high blood pressure, is when the force  of blood pumping through the arteries is too strong. The arteries are the blood vessels that carry blood from the heart throughout the body. Hypertension forces the heart to work harder to pump blood and may cause arteries to become narrow or stiff. Having untreated or uncontrolled hypertension can cause heart attacks, strokes, kidney disease, and other problems. A blood pressure reading consists of a higher number over a lower number. Ideally, your blood pressure should be below 120/80. The first ("top") number is called the systolic pressure. It is a measure of the pressure in your arteries as your heart beats. The second ("bottom") number is called the diastolic pressure. It  is a measure of the pressure in your arteries as the heart relaxes. What are the causes? The cause of this condition is not known. What increases the risk? Some risk factors for high blood pressure are under your control. Others are not. Factors you can change  Smoking.  Having type 2 diabetes mellitus, high cholesterol, or both.  Not getting enough exercise or physical activity.  Being overweight.  Having too much fat, sugar, calories, or salt (sodium) in your diet.  Drinking too much alcohol. Factors that are difficult or impossible to change  Having chronic kidney disease.  Having a family history of high blood pressure.  Age. Risk increases with age.  Race. You may be at higher risk if you are African-American.  Gender. Men are at higher risk than women before age 22. After age 101, women are at higher risk than men.  Having obstructive sleep apnea.  Stress. What are the signs or symptoms? Extremely high blood pressure (hypertensive crisis) may cause:  Headache.  Anxiety.  Shortness of breath.  Nosebleed.  Nausea and vomiting.  Severe chest pain.  Jerky movements you cannot control (seizures). How is this diagnosed? This condition is diagnosed by measuring your blood pressure while you are seated, with your arm resting on a surface. The cuff of the blood pressure monitor will be placed directly against the skin of your upper arm at the level of your heart. It should be measured at least twice using the same arm. Certain conditions can cause a difference in blood pressure between your right and left arms. Certain factors can cause blood pressure readings to be lower or higher than normal (elevated) for a short period of time:  When your blood pressure is higher when you are in a health care provider's office than when you are at home, this is called white coat hypertension. Most people with this condition do not need medicines.  When your blood pressure is  higher at home than when you are in a health care provider's office, this is called masked hypertension. Most people with this condition may need medicines to control blood pressure. If you have a high blood pressure reading during one visit or you have normal blood pressure with other risk factors:  You may be asked to return on a different day to have your blood pressure checked again.  You may be asked to monitor your blood pressure at home for 1 week or longer. If you are diagnosed with hypertension, you may have other blood or imaging tests to help your health care provider understand your overall risk for other conditions. How is this treated? This condition is treated by making healthy lifestyle changes, such as eating healthy foods, exercising more, and reducing your alcohol intake. Your health care provider may prescribe medicine if lifestyle changes are not enough to  get your blood pressure under control, and if:  Your systolic blood pressure is above 130.  Your diastolic blood pressure is above 80. Your personal target blood pressure may vary depending on your medical conditions, your age, and other factors. Follow these instructions at home: Eating and drinking   Eat a diet that is high in fiber and potassium, and low in sodium, added sugar, and fat. An example eating plan is called the DASH (Dietary Approaches to Stop Hypertension) diet. To eat this way: ? Eat plenty of fresh fruits and vegetables. Try to fill half of your plate at each meal with fruits and vegetables. ? Eat whole grains, such as whole wheat pasta, brown rice, or whole grain bread. Fill about one quarter of your plate with whole grains. ? Eat or drink low-fat dairy products, such as skim milk or low-fat yogurt. ? Avoid fatty cuts of meat, processed or cured meats, and poultry with skin. Fill about one quarter of your plate with lean proteins, such as fish, chicken without skin, beans, eggs, and tofu. ? Avoid  premade and processed foods. These tend to be higher in sodium, added sugar, and fat.  Reduce your daily sodium intake. Most people with hypertension should eat less than 1,500 mg of sodium a day.  Limit alcohol intake to no more than 1 drink a day for nonpregnant women and 2 drinks a day for men. One drink equals 12 oz of beer, 5 oz of wine, or 1 oz of hard liquor. Lifestyle   Work with your health care provider to maintain a healthy body weight or to lose weight. Ask what an ideal weight is for you.  Get at least 30 minutes of exercise that causes your heart to beat faster (aerobic exercise) most days of the week. Activities may include walking, swimming, or biking.  Include exercise to strengthen your muscles (resistance exercise), such as pilates or lifting weights, as part of your weekly exercise routine. Try to do these types of exercises for 30 minutes at least 3 days a week.  Do not use any products that contain nicotine or tobacco, such as cigarettes and e-cigarettes. If you need help quitting, ask your health care provider.  Monitor your blood pressure at home as told by your health care provider.  Keep all follow-up visits as told by your health care provider. This is important. Medicines  Take over-the-counter and prescription medicines only as told by your health care provider. Follow directions carefully. Blood pressure medicines must be taken as prescribed.  Do not skip doses of blood pressure medicine. Doing this puts you at risk for problems and can make the medicine less effective.  Ask your health care provider about side effects or reactions to medicines that you should watch for. Contact a health care provider if:  You think you are having a reaction to a medicine you are taking.  You have headaches that keep coming back (recurring).  You feel dizzy.  You have swelling in your ankles.  You have trouble with your vision. Get help right away if:  You develop  a severe headache or confusion.  You have unusual weakness or numbness.  You feel faint.  You have severe pain in your chest or abdomen.  You vomit repeatedly.  You have trouble breathing. Summary  Hypertension is when the force of blood pumping through your arteries is too strong. If this condition is not controlled, it may put you at risk for serious complications.  Your  personal target blood pressure may vary depending on your medical conditions, your age, and other factors. For most people, a normal blood pressure is less than 120/80.  Hypertension is treated with lifestyle changes, medicines, or a combination of both. Lifestyle changes include weight loss, eating a healthy, low-sodium diet, exercising more, and limiting alcohol. This information is not intended to replace advice given to you by your health care provider. Make sure you discuss any questions you have with your health care provider. Document Released: 05/22/2005 Document Revised: 04/19/2016 Document Reviewed: 04/19/2016 Elsevier Interactive Patient Education  2019 [employer].

## 2018-06-06 NOTE — Progress Notes (Addendum)
Sarah Washington, is a 33 y.o. female  WLN:[id number]  DEY:[id number]  DOB - 04/10/1986  CC:  Chief Complaint  Patient presents with  . Establish Care  . Hypertension    hx of htn. was on HCTZ years ago. no chest pain, SHOB. has lower extremity swelling       HPI: Sarah Washington is a 33 y.o. female is here today to establish care and management of hypertension and recurring skin abscess.  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 Patient a history if anemia and TTP for which she was hospitalized for in May 2019 at [facility]. She is closely followed by Dr. Irene Limbo Hematologist at [facility] since relocating to [place] from [facility]. Most recent labs indicate a normal platelet count 353, although she continues have residual amenia with hemoglobin  10.8.   Hypertension  She was referred her by Dr, Irene Limbo for management of hypertension. Patient reports a diagnosis of hypertension sometime ago and was prescribed HCTZ, although never took medication consistently. Family history significant for hypertension and diabetes. She endorses edema of lower extremities occurring daily which resolves with ambulation of legs. She doesn't restrict sodium foods. Denies shortness of breath, chest pain, dizziness, or new weakness. Blood pressure per review of recent medical office visits, has consistently ran greater than 140/80.  Hidraenitis Suppurativa  Patient complains of several years of recurrent abscess formation mostly affecting her axilla and groin region. Cysts are fluid filled and often drain with manual manipulation or without intervention. No prior wound cultures. No formal dermatology evaluation. She has been placed on antibiotics in the past without improvement of recurrent cyst. At present she currently has an abscess under her left axilla and groin region. Denies fever, chills, nausea, or vomiting.  Current medications: Current Outpatient Medications:  .  iron  polysaccharides (NIFEREX) 150 MG capsule, Take 1 capsule (150 mg total) by mouth daily., Disp: 30 capsule, Rfl: 3   Pertinent family medical history: family history includes Arthritis in her maternal grandmother and paternal grandmother; Cancer in her maternal grandmother and paternal grandmother; Diabetes in her father, maternal grandmother, mother, and paternal grandmother; Hypertension in her father, maternal grandmother, mother, and paternal grandmother; Mental illness in her father; Stroke in her maternal grandfather and maternal grandmother.   Allergies  Allergen Reactions  . Sulfa Antibiotics     Social History   Socioeconomic History  . Marital status: Single    Spouse name: Not on file  . Number of children: 3  . Years of education: Not on file  . Highest education level: Not on file  Occupational History  . Not on file  Social Needs  . Financial resource strain: Not on file  . Food insecurity:    Worry: Not on file    Inability: Not on file  . Transportation needs:    Medical: Not on file    Non-medical: Not on file  Tobacco Use  . Smoking status: Former Smoker    Types: Cigarettes    Last attempt to quit: 10/2017    Years since quitting: 0.6  . Smokeless tobacco: Never Used  Substance and Sexual Activity  . Alcohol use: Yes  . Drug use: Never  . Sexual activity: Yes  Lifestyle  . Physical activity:    Days per week: Not on file    Minutes per session: Not on file  . Stress: Not on file  Relationships  . Social connections:    Talks on phone: Not on file  Gets together: Not on file    Attends religious service: Not on file    Active member of club or organization: Not on file    Attends meetings of clubs or organizations: Not on file    Relationship status: Not on file  . Intimate partner violence:    Fear of current or ex partner: Not on file    Emotionally abused: Not on file    Physically abused: Not on file    Forced sexual activity: Not on file   Other Topics Concern  . Not on file  Social History Narrative  . Not on file    Review of Systems: Pertinent negatives listed in HPI Objective:   Vitals:   06/06/18 1600  BP: (!) 142/98  Pulse: 95  Resp: 17  SpO2: 98%    BP Readings from Last 3 Encounters:  06/06/18 (!) 142/98  05/01/18 (!) 144/99  01/09/18 (!) 150/107    Filed Weights   06/06/18 1600  Weight: 229 lb 6.4 oz (104.1 kg)      Physical Exam: Constitutional: Patient appears well-developed and well-nourished. No distress. HENT: Normocephalic, atraumatic, External right and left ear normal. Oropharynx is clear and moist.  Eyes: Conjunctivae and EOM are normal. PERRLA, no scleral icterus. Neck: Normal ROM. Neck supple. No JVD. No tracheal deviation. No thyromegaly. CVS: RRR, S1/S2 +, no murmurs, no gallops, no carotid bruit.  Pulmonary: Effort and breath sounds normal, no stridor, rhonchi, wheezes, rales.  Abdominal: Soft. BS +, no distension, tenderness, rebound or guarding.  Musculoskeletal: Normal range of motion. No edema and no tenderness.  Neuro: Alert. Normal muscle tone coordination. Normal gait. BUE and BLE strength 5/5.  Skin: Skin is warm and dry. Abscess present Psychiatric: Normal mood and affect. Behavior, judgment, thought content normal.  Lab Results (prior encounters)  Lab Results  Component Value Date   WBC 10.8 (H) 05/01/2018   HGB 10.7 (L) 05/01/2018   HCT 33.2 (L) 05/01/2018   MCV 86.0 05/01/2018   PLT 353 05/01/2018   Lab Results  Component Value Date   CREATININE 1.05 (H) 05/01/2018   BUN 12 05/01/2018   NA 141 05/01/2018   K 3.8 05/01/2018   CL 108 05/01/2018   CO2 24 05/01/2018       Assessment and plan:  1. Encounter to establish care  2. Essential hypertension Resumed HCTZ 25 mg once daily. Will titrate dose if warranted to achieve good BP control.  We have discussed target BP range and blood pressure goal. I have advised patient to check BP regularly and to call  us back or report to clinic if the numbers are consistently higher than 140/90. We discussed the importance of compliance with medical therapy and DASH diet recommended, consequences of uncontrolled hypertension discussed.   3. Hidradenitis -Start Doxycyline 100 mg daily for preventative therapy -Apply mupirocin ointment to open/draining lesion to reduce infection. -Return 5-7 days for I&D  -Placing a referral to dermatology for management of skin condition   4. TTP, stable Continue to follow-up with hematology as directed.  Meds ordered this encounter  Medications  . mupirocin ointment (BACTROBAN) 2 %    Sig: Apply 1 application topically 3 (three) times daily.    Dispense:  30 g    Refill:  2  . doxycycline (VIBRAMYCIN) 100 MG capsule    Sig: Take 1 capsule (100 mg total) by mouth daily.    Dispense:  90 capsule    Refill:  1  . hydrochlorothiazide (  HYDRODIURIL) 25 MG tablet    Sig: Take 1 tablet (25 mg total) by mouth daily.    Dispense:  90 tablet    Refill:  3    A total of 40 minutes spent, greater than 50 % of this time was spent counseling, reviewing prior medical records and coordination of care.   Return 5-7 days for I&D and skin tag removal   The patient was given clear instructions to go to ER or return to medical center if symptoms don't improve, worsen or new problems develop. The patient verbalized understanding. The patient was advised  to call and obtain lab results if they haven't heard anything from out office within 7-10 business days.  Molli Barrows, FNP Primary Care at [facility] [address] [place] [address] [phone]: [phone]    This note has been created with Dragon speech recognition software and Engineer, materials. Any transcriptional errors are unintentional.

## 2018-06-10 ENCOUNTER — Ambulatory Visit (INDEPENDENT_AMBULATORY_CARE_PROVIDER_SITE_OTHER): Payer: Medicaid Other | Admitting: Family Medicine

## 2018-06-10 ENCOUNTER — Encounter: Payer: Self-pay | Admitting: Family Medicine

## 2018-06-10 ENCOUNTER — Other Ambulatory Visit (HOSPITAL_COMMUNITY)
Admission: RE | Admit: 2018-06-10 | Discharge: 2018-06-10 | Disposition: A | Discharge status: Home / Self Care | Payer: Medicaid Other | Source: Ambulatory Visit | Attending: Family Medicine | Admitting: Family Medicine

## 2018-06-10 VITALS — BP 141/92 | HR 107 | Temp 98.6°F | Resp 17 | Ht 67.0 in | Wt 229.0 lb

## 2018-06-10 DIAGNOSIS — D235 Other benign neoplasm of skin of trunk: Secondary | ICD-10-CM | POA: Diagnosis not present

## 2018-06-10 DIAGNOSIS — M311 Thrombotic microangiopathy: Secondary | ICD-10-CM

## 2018-06-10 DIAGNOSIS — L732 Hidradenitis suppurativa: Secondary | ICD-10-CM

## 2018-06-10 DIAGNOSIS — M3119 Other thrombotic microangiopathy: Secondary | ICD-10-CM

## 2018-06-10 DIAGNOSIS — L989 Disorder of the skin and subcutaneous tissue, unspecified: Secondary | ICD-10-CM

## 2018-06-10 NOTE — Progress Notes (Signed)
Established Patient Office Visit  Subjective:  Patient ID: Sarah Washington, female    DOB: May 15, 1986  Age: 33 y.o. MRN: [id number]  CC:  Chief Complaint  Patient presents with  . I&D  . Mole Removal    HPI Sarah Washington presents for possible I&D and mole removal.  Hidradenitis  Patient has a history of hidradenitis. Seen in office 06/06/2018 for evaluation of reoccurring abscess of the groin and left axilla. She was started on antibiotics and topical antimicrobial ointment. Both areas have almost complete drained and healed. Denies fever, shortness of breath, chills, nausea, or vomiting. Tolerating Doxycyline.   Mole removal   Requesting mole removal. Mole was initially flat, however over the last few years, has increased in diameter and is now pedunculated. Mole is now irritated as it is constantly getting stuck in clothing. She has no history of skin cancer nor any known family history.  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 Most recent platelet count within normal range of 353 as of 05/01/2018.   Past Medical History:  Diagnosis Date  . Anxiety and depression   . Normocytic anemia   . TTP (thrombotic thrombocytopenic purpura) (HCC)      Family History  Problem Relation Age of Onset  . Hypertension Mother   . Diabetes Mother   . Diabetes Father   . Hypertension Father   . Mental illness Father   . Arthritis Maternal Grandmother   . Cancer Maternal Grandmother   . Diabetes Maternal Grandmother   . Hypertension Maternal Grandmother   . Stroke Maternal Grandmother   . Stroke Maternal Grandfather   . Arthritis Paternal Grandmother   . Cancer Paternal Grandmother   . Diabetes Paternal Grandmother   . Hypertension Paternal Grandmother     Social History   Socioeconomic History  . Marital status: Single    Spouse name: Not on file  . Number of children: 3  . Years of education: Not on file  . Highest education level: Not on file  Occupational History  . Not on  file  Social Needs  . Financial resource strain: Not on file  . Food insecurity:    Worry: Not on file    Inability: Not on file  . Transportation needs:    Medical: Not on file    Non-medical: Not on file  Tobacco Use  . Smoking status: Former Smoker    Types: Cigarettes    Last attempt to quit: 10/2017    Years since quitting: 0.6  . Smokeless tobacco: Never Used  Substance and Sexual Activity  . Alcohol use: Yes  . Drug use: Never  . Sexual activity: Yes  Lifestyle  . Physical activity:    Days per week: Not on file    Minutes per session: Not on file  . Stress: Not on file  Relationships  . Social connections:    Talks on phone: Not on file    Gets together: Not on file    Attends religious service: Not on file    Active member of club or organization: Not on file    Attends meetings of clubs or organizations: Not on file    Relationship status: Not on file  . Intimate partner violence:    Fear of current or ex partner: Not on file    Emotionally abused: Not on file    Physically abused: Not on file    Forced sexual activity: Not on file  Other Topics Concern  . Not on file  Social History Narrative  . Not on file    Outpatient Medications Prior to Visit  Medication Sig Dispense Refill  . doxycycline (VIBRAMYCIN) 100 MG capsule Take 1 capsule (100 mg total) by mouth daily. 90 capsule 1  . hydrochlorothiazide (HYDRODIURIL) 25 MG tablet Take 1 tablet (25 mg total) by mouth daily. 90 tablet 3  . iron polysaccharides (NIFEREX) 150 MG capsule Take 1 capsule (150 mg total) by mouth daily. 30 capsule 3  . mupirocin ointment (BACTROBAN) 2 % Apply 1 application topically 3 (three) times daily. 30 g 2   No facility-administered medications prior to visit.     Allergies  Allergen Reactions  . Sulfa Antibiotics     Bactrim    ROS Review of Systems Pertinent negatives listed in HPI   Objective:    Physical Exam  BP (!) 141/92   Pulse (!) 107   Temp 98.6 F  (37 C) (Oral)   Resp 17   Ht 5\' 7"  (1.702 m)   Wt 229 lb (103.9 kg)   SpO2 97%   BMI 35.87 kg/m  Wt Readings from Last 3 Encounters:  06/10/18 229 lb (103.9 kg)  06/06/18 229 lb 6.4 oz (104.1 kg)  05/01/18 228 lb 3.2 oz (103.5 kg)   General appearance: alert, well developed, well nourished, cooperative and in no distress Head: Normocephalic, without obvious abnormality, atraumatic Respiratory: Respirations even and unlabored, normal respiratory rate Extremities: No gross deformities Skin: Skin color, texture, turgor normal. Left axilla-healed non-draining lesions. Left groin lesion, mildly excoriated. No drainage or induration. Psych: Appropriate mood and affect. Neurologic: Mental status: Alert, oriented to person, place, and time, thought content appropriate. Procedure note P: Skin tags are snipped off using Betadine for cleansing and sterile # 10 blade. Local anesthesia administer with lidocaine with epinephrine. Specimen collect and sent to pathology. Procedure tolerated. Minimal blood loss. Site dressed with sterile gauze and Tegaderm. Health Maintenance Due  Topic Date Due  . TETANUS/TDAP  07/07/2002  . INFLUENZA VACCINE  01/03/2018   No results found for: TSH Lab Results  Component Value Date   WBC 10.8 (H) 05/01/2018   HGB 10.7 (L) 05/01/2018   HCT 33.2 (L) 05/01/2018   MCV 86.0 05/01/2018   PLT 353 05/01/2018   Lab Results  Component Value Date   NA 141 05/01/2018   K 3.8 05/01/2018   CO2 24 05/01/2018   GLUCOSE 88 05/01/2018   BUN 12 05/01/2018   CREATININE 1.05 (H) 05/01/2018   BILITOT <0.2 (L) 05/01/2018   ALKPHOS 59 05/01/2018   AST 14 (L) 05/01/2018   ALT 10 05/01/2018   PROT 7.7 05/01/2018   ALBUMIN 3.4 (L) 05/01/2018   CALCIUM 8.8 (L) 05/01/2018   ANIONGAP 9 05/01/2018     Assessment & Plan:  1. Encounter for removal of skin lesion -Tolerated procedure.  - Dermatology pathology, sent.  2. TTP (thrombotic thrombocytopenic purpura)  (HCC) -Continue follow-up with hematology.  3. Hidradenitis suppurativa -Referred to dermatology. Antibiotic resolving most current abscess formation therefore no I&D performed.    Follow-up: No follow-ups on file.    Molli Barrows, FNP

## 2018-06-10 NOTE — Patient Instructions (Signed)
I will see you back for hypertension follow-up in 6 months.    Skin Biopsy, Care After This sheet gives you information about how to care for yourself after your procedure. Your health care provider may also give you more specific instructions. If you have problems or questions, contact your health care provider. What can I expect after the procedure? After the procedure, it is common to have:  Soreness.  Bruising.  Itching. Follow these instructions at home: Biopsy site care Follow instructions from your health care provider about how to take care of your biopsy site. Make sure you:  Wash your hands with soap and water before and after you change your bandage (dressing). If soap and water are not available, use hand sanitizer.  Apply ointment on your biopsy site as directed by your health care provider.  Change your dressing as told by your health care provider.  Leave stitches (sutures), skin glue, or adhesive strips in place. These skin closures may need to stay in place for 2 weeks or longer. If adhesive strip edges start to loosen and curl up, you may trim the loose edges. Do not remove adhesive strips completely unless your health care provider tells you to do that.  If the biopsy area bleeds, apply gentle pressure for 10 minutes. Check your biopsy site every day for signs of infection. Check for:  Redness, swelling, or pain.  Fluid or blood.  Warmth.  Pus or a bad smell.  General instructions  Rest and then return to your normal activities as told by your health care provider.  Take over-the-counter and prescription medicines only as told by your health care provider.  Keep all follow-up visits as told by your health care provider. This is important. Contact a health care provider if:  You have redness, swelling, or pain around your biopsy site.  You have fluid or blood coming from your biopsy site.  Your biopsy site feels warm to the touch.  You have pus or  a bad smell coming from your biopsy site.  You have a fever.  Your sutures, skin glue, or adhesive strips loosen or come off sooner than expected. Get help right away if:  You have bleeding that does not stop with pressure or a dressing. Summary  After the procedure, it is common to have soreness, bruising, and itching at the site.  Follow instructions from your health care provider about how to take care of your biopsy site.  Check your biopsy site every day for signs of infection.  Contact a health care provider if you have redness, swelling, or pain around your biopsy site, or your biopsy site feels warm to the touch.  Keep all follow-up visits as told by your health care provider. This is important. This information is not intended to replace advice given to you by your health care provider. Make sure you discuss any questions you have with your health care provider. Document Released: 06/18/2015 Document Revised: 11/19/2017 Document Reviewed: 11/19/2017 Elsevier Interactive Patient Education  [employer].

## 2018-06-17 ENCOUNTER — Encounter (HOSPITAL_COMMUNITY): Payer: Self-pay

## 2018-06-17 ENCOUNTER — Emergency Department (HOSPITAL_COMMUNITY)
Admission: EM | Admit: 2018-06-17 | Discharge: 2018-06-18 | Disposition: A | Discharge status: Home / Self Care | Payer: Medicaid Other | Attending: Emergency Medicine | Admitting: Emergency Medicine

## 2018-06-17 DIAGNOSIS — Z79899 Other long term (current) drug therapy: Secondary | ICD-10-CM | POA: Diagnosis not present

## 2018-06-17 DIAGNOSIS — J111 Influenza due to unidentified influenza virus with other respiratory manifestations: Secondary | ICD-10-CM | POA: Diagnosis not present

## 2018-06-17 DIAGNOSIS — Z87891 Personal history of nicotine dependence: Secondary | ICD-10-CM | POA: Insufficient documentation

## 2018-06-17 DIAGNOSIS — R69 Illness, unspecified: Secondary | ICD-10-CM

## 2018-06-17 DIAGNOSIS — R509 Fever, unspecified: Secondary | ICD-10-CM | POA: Diagnosis present

## 2018-06-17 LAB — URINALYSIS, ROUTINE W REFLEX MICROSCOPIC
Bilirubin Urine: NEGATIVE
Glucose, UA: NEGATIVE mg/dL
Ketones, ur: NEGATIVE mg/dL
Leukocytes, UA: NEGATIVE
Nitrite: NEGATIVE
Protein, ur: 30 mg/dL — AB
RBC / HPF: >50 RBC/hpf — ABNORMAL HIGH (ref 0–5)
SPECIFIC GRAVITY, URINE: 1.029 (ref 1.005–1.030)
pH: 5 (ref 5.0–8.0)

## 2018-06-17 LAB — CBC
HCT: 35.2 % — ABNORMAL LOW (ref 36.0–46.0)
HEMOGLOBIN: 11.3 g/dL — AB (ref 12.0–15.0)
MCH: 26.8 pg (ref 26.0–34.0)
MCHC: 32.1 g/dL (ref 30.0–36.0)
MCV: 83.4 fL (ref 80.0–100.0)
Platelets: 333 10*3/uL (ref 150–400)
RBC: 4.22 MIL/uL (ref 3.87–5.11)
RDW: 13.3 % (ref 11.5–15.5)
WBC: 22.4 10*3/uL — ABNORMAL HIGH (ref 4.0–10.5)
nRBC: 0 % (ref 0.0–0.2)

## 2018-06-17 LAB — BASIC METABOLIC PANEL
Anion gap: 9 (ref 5–15)
BUN: 11 mg/dL (ref 6–20)
CO2: 22 mmol/L (ref 22–32)
Calcium: 8.8 mg/dL — ABNORMAL LOW (ref 8.9–10.3)
Chloride: 99 mmol/L (ref 98–111)
Creatinine, Ser: 0.77 mg/dL (ref 0.44–1.00)
GFR calc Af Amer: >60 mL/min (ref >60)
GFR calc non Af Amer: >60 mL/min (ref >60)
Glucose, Bld: 90 mg/dL (ref 70–99)
Potassium: 3.4 mmol/L — ABNORMAL LOW (ref 3.5–5.1)
Sodium: 130 mmol/L — ABNORMAL LOW (ref 135–145)

## 2018-06-17 LAB — I-STAT BETA HCG BLOOD, ED (MC, WL, AP ONLY): I-stat hCG, quantitative: <5 m[IU]/mL (ref <5)

## 2018-06-17 MED ORDER — ACETAMINOPHEN 500 MG PO TABS
1000.0000 mg | ORAL_TABLET | Freq: Once | ORAL | Status: AC
  Administered 2018-06-18: 1000 mg via ORAL
  Filled 2018-06-17: qty 2

## 2018-06-17 NOTE — ED Triage Notes (Signed)
Pt reports recent blood disorder transfusion and is now having generalized body aches and dizziness for the past 2 days with weakness.

## 2018-06-17 NOTE — ED Provider Notes (Signed)
TIME SEEN: 11:44 PM  CHIEF COMPLAINT: Flulike symptoms  HPI: Patient is a 33 year old female with history of hidradenitis, hypertension, TTP, anemia who presents to the emergency department with flulike symptoms.  States for the past 3 days she has had fevers, chills, mild headache, nonproductive cough, body aches.  No vomiting, diarrhea, dysuria, hematuria, vaginal bleeding or discharge.  She works at [employer].  Has had sick contacts.  Did not have a flu shot this year.  She has small area of hidradenitis in the left axilla that is improving as she is on doxycycline currently.  No other draining or painful lesions at this time.  She has history of TTP and last plasma exchange was in May.  Per nursing note there was a recent transfusion.  Patient denies this.  ROS: See HPI Constitutional:  fever  Eyes: no drainage  ENT: no runny nose   Cardiovascular:  no chest pain  Resp: no SOB  GI: no vomiting GU: no dysuria Integumentary: no rash  Allergy: no hives  Musculoskeletal: no leg swelling  Neurological: no slurred speech ROS otherwise negative  PAST MEDICAL HISTORY/PAST SURGICAL HISTORY:  Past Medical History:  Diagnosis Date  . Anxiety and depression   . Normocytic anemia   . TTP (thrombotic thrombocytopenic purpura) (HCC)     MEDICATIONS:  Prior to Admission medications   Medication Sig Start Date End Date Taking? Authorizing Provider  doxycycline (VIBRAMYCIN) 100 MG capsule Take 1 capsule (100 mg total) by mouth daily. 06/06/18   Scot Jun, FNP  hydrochlorothiazide (HYDRODIURIL) 25 MG tablet Take 1 tablet (25 mg total) by mouth daily. 06/06/18   Scot Jun, FNP  iron polysaccharides (NIFEREX) 150 MG capsule Take 1 capsule (150 mg total) by mouth daily. 05/01/18   Brunetta Genera, MD  mupirocin ointment (BACTROBAN) 2 % Apply 1 application topically 3 (three) times daily. 06/06/18   Scot Jun, FNP    ALLERGIES:  Allergies  Allergen  Reactions  . Sulfa Antibiotics     Bactrim    SOCIAL HISTORY:  Social History   Tobacco Use  . Smoking status: Former Smoker    Types: Cigarettes    Last attempt to quit: 10/2017    Years since quitting: 0.7  . Smokeless tobacco: Never Used  Substance Use Topics  . Alcohol use: Yes    FAMILY HISTORY: Family History  Problem Relation Age of Onset  . Hypertension Mother   . Diabetes Mother   . Diabetes Father   . Hypertension Father   . Mental illness Father   . Arthritis Maternal Grandmother   . Cancer Maternal Grandmother   . Diabetes Maternal Grandmother   . Hypertension Maternal Grandmother   . Stroke Maternal Grandmother   . Stroke Maternal Grandfather   . Arthritis Paternal Grandmother   . Cancer Paternal Grandmother   . Diabetes Paternal Grandmother   . Hypertension Paternal Grandmother     EXAM: BP (!) 135/98 (BP Location: Right Arm)   Pulse (!) 111   Temp (!) 100.7 F (38.2 C) (Oral)   Resp 18   SpO2 97%  CONSTITUTIONAL: Alert and oriented and responds appropriately to questions. Well-appearing; well-nourished, nontoxic-appearing HEAD: Normocephalic EYES: Conjunctivae clear, pupils appear equal, EOMI ENT: normal nose; moist mucous membranes NECK: Supple, no meningismus, no nuchal rigidity, no LAD  CARD: Tachycardic; S1 and S2 appreciated; no murmurs, no clicks, no rubs, no gallops RESP: Normal chest excursion without splinting or tachypnea; breath sounds clear and  equal bilaterally; no wheezes, no rhonchi, no rales, no hypoxia or respiratory distress, speaking full sentences ABD/GI: Normal bowel sounds; non-distended; soft, non-tender, no rebound, no guarding, no peritoneal signs, no hepatosplenomegaly BACK:  The back appears normal and is non-tender to palpation, there is no CVA tenderness EXT: Normal ROM in all joints; non-tender to palpation; no edema; normal capillary refill; no cyanosis, no calf tenderness or swelling    SKIN: Normal color for age  and race; warm; no rash, patient has a 0.5 cm indurated area to the left axilla without surrounding redness or warmth or active drainage and no fluctuance NEURO: Moves all extremities equally PSYCH: The patient's mood and manner are appropriate. Grooming and personal hygiene are appropriate.  MEDICAL DECISION MAKING: Patient here with flulike symptoms.  Labs obtained in triage show leukocytosis which is likely from her viral illness.  She has history of anemia but hemoglobin today is 11.3 which is improved from recent of 10.7.  Her platelet count is 333,000.  Urine shows blood but she is currently on her menstrual cycle.  No other sign of infection.  Lungs are currently clear to auscultation.  Doubt pneumonia.  No headache or meningismus.  She does not appear septic today.  She is extremely well-appearing.  States she needs a work note as she works in a [employer].  She does have an area of hidradenitis in the left axilla but does not appear acutely infected at this time.  She is on doxycycline.  I feel she is safe to be discharged home.  Discussed supportive care instructions.  She is outside treatment window for Tamiflu at this time.    ED PROGRESS: Patient given Tylenol here in the emergency department.  Her vital signs have improved.  I feel she is safe to be discharged home.   At this time, I do not feel there is any life-threatening condition present. I have reviewed and discussed all results (EKG, imaging, lab, urine as appropriate) and exam findings with patient/family. I have reviewed nursing notes and appropriate previous records.  I feel the patient is safe to be discharged home without further emergent workup and can continue workup as an outpatient as needed. Discussed usual and customary return precautions. Patient/family verbalize understanding and are comfortable with this plan.  Outpatient follow-up has been provided as needed. All questions have been answered.     EKG  Interpretation  Date/Time:  Monday June 17 2018 20:36:49 EST Ventricular Rate:  109 PR Interval:  144 QRS Duration: 78 QT Interval:  368 QTC Calculation: 495 R Axis:   70 Text Interpretation:  Sinus tachycardia Otherwise normal ECG No old tracing to compare Confirmed by Marjory Meints, Cyril Mourning [phone]) on 06/17/2018 11:08:56 PM           Constantin Hillery, Delice Bison, DO 06/18/18 [id number]

## 2018-06-18 NOTE — Discharge Instructions (Signed)
You may alternate Tylenol 1000 mg every 6 hours as needed for fever and pain and ibuprofen 800 mg every 8 hours as needed for fever and pain. Please rest and drink plenty of fluids. This is a viral illness causing your symptoms. You do not need antibiotics for a virus. You may use over-the-counter nasal saline spray and Afrin nasal saline spray as needed for nasal congestion. Please do not use Afrin for more than 3 days in a row. You may use guaifenesin and dextromethorphan as needed for cough.  This is safe with hypertension.  You may use lozenges and Chloraseptic spray to help with sore throat.  Warm salt water gargles can also help with sore throat.  You may use over-the-counter Unisom (doxyalamine) or Benadryl to help with sleep.  Please note that some combination medicines such as DayQuil and NyQuil have multiple medications in them.  Please make sure you look at all labels to ensure that you are not taking too much of any one particular medication.  Symptoms from a virus may take 7-14 days to run its course.  We do not test for the flu from the emergency department as it takes hours for this test to come back and it would not change our management. The flu is treated like any other virus with supportive measures as listed above. At this time you are outside the treatment window for Tamiflu. Tamiflu has to be taken within the first 48 hours of symptoms.  Tamiflu has many side effects including nausea, vomiting and diarrhea.

## 2018-06-25 NOTE — Progress Notes (Signed)
HEMATOLOGY/ONCOLOGY CONSULTATION NOTE  Date of Service: 06/25/2018  Patient Care Team: Scot Jun, FNP as PCP - General (Family Medicine)  CHIEF COMPLAINTS/PURPOSE OF CONSULTATION:  Thrombotic microangiopathy  HISTORY OF PRESENTING ILLNESS:   Sarah Washington is a wonderful 33 y.o. female who has been referred to [place] by Dr Mabeline Caras for evaluation and management of recently diagnosed acquired TTP/Thrombotic microangiopathy. She is accompanied today by her three children. The pt reports that she is doing well overall.   The pt reports that in January 2019 she began feeling weak, tired, and developed a newly heavy menstrual cycle. She also sites bruising easily that began occurring in January 2019 but denies nose bleeds, gum bleeds, and blood in the stools at the time. She notes that she first had labs to evaluate these symptoms She noted finger tip neuropathy, severe headache, abdominal pains, and tingling in her feet. She denies any infections at the time, PO contraceptives, hormone therapy, and new medications at the time. She also denies recent blood transfusions as well. She was discharged with Atovaquone (for PCP prophylaxis). 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  The pt presented to [facility] on 11/02/17 for TTP.  Her last plasma exchange was on 11/07/17. She received 5 total plasma exchanges. She received her third Rituxan infusion on 11/15/17, and only notes some mild itching but denies any rashes with her infusion. She has not yet received her fourth dose or Rituxan as she has moved from [facility] to [place]. She has been taking 30mg  Prednisone since 11/15/17.  She adds that she has gained 20 pounds of weight, noting that she has a very strong appetite while taking steroids.    The pt has previously taken BP medication for  her HTN.  She received 5 units of blood, and no plasma exchange after delivery of her third child was premature at 5 months due to HELLP syndrome.   Most recent lab results (11/15/17) of CBC  is as follows: all values are WNL except for WBC at 12.4k, RBC at 3.69, HGB at 11.8, RDW at 22.3, MPV at 6.9, PLT at 137k.  On review of systems, pt reports increased appetite, weight gain, normalized menstrual bleeding, and denies headaches, peripheral neuropathy, fevers, chills, night sweats, back pains, abdominal pains, changes in bowel habits, and any other symptoms.   On PMHx the pt reports HTN, tubal ligation in 2010, HELLP syndrome with third pregnancy requiring delivery after 5 months. Angioedema reaction to sulfa drugs.  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  On Family Hx the pt denies any blood, cancer, or autoimmune disorders.  Interval History:    [name] returns today regarding management and evaluation of her TTP. The patient's last visit with [place] was on 05/01/18. She is accompanied today by a friend. The pt reports that she is doing well overall.   The pt reports that she has established care with a PCP, Molli Barrows, NP and is taking doxycycline for a boil under her arm. The pt notes that she began taking PO iron replacement about every other day. She continues heaving heavy periods but notes that she is pursuing an OBGYN referral with her PCP. She notes that she has occasional low energy levels. The pt notes that she is eating well and has lost 5 pounds in the interim which she  has desired to do.   Lab results today (06/26/18) of CBC w/diff and CMP is as follows: all values are WNL except for WBC at 16.2k, HGB at 11.7, HCT at 35.3, ANC at 9.6k, Lymphs abs at 5.7k, Total Protein at 8.5, AST at 13. 06/26/18 Ferritin 39 , Iron & TIBC 9% , and LDH are wnl at 143  On review of systems, pt reports heavy periods, occasional low energy levels, desired weight gain, eating well,  and denies fevers, chills, night sweats, other concerns for infections, abdominal pains, leg swelling, and any other symptoms.   MEDICAL HISTORY:   HTN, tubal ligation in 2010, HELLP syndrome with third pregnancy requiring delivery after 5 months. Angioedema reaction to sulfa drugs.   SURGICAL HISTORY: tubal ligation in 2010  SOCIAL HISTORY: Social History   Socioeconomic History  . Marital status: Single    Spouse name: Not on file  . Number of children: 3  . Years of education: Not on file  . Highest education level: Not on file  Occupational History  . Not on file  Social Needs  . Financial resource strain: Not on file  . Food insecurity:    Worry: Not on file    Inability: Not on file  . Transportation needs:    Medical: Not on file    Non-medical: Not on file  Tobacco Use  . Smoking status: Former Smoker    Types: Cigarettes    Last attempt to quit: 10/2017    Years since quitting: 0.7  . Smokeless tobacco: Never Used  Substance and Sexual Activity  . Alcohol use: Yes  . Drug use: Never  . Sexual activity: Yes  Lifestyle  . Physical activity:    Days per week: Not on file    Minutes per session: Not on file  . Stress: Not on file  Relationships  . Social connections:    Talks on phone: Not on file    Gets together: Not on file    Attends religious service: Not on file    Active member of club or organization: Not on file    Attends meetings of clubs or organizations: Not on file    Relationship status: Not on file  . Intimate partner violence:    Fear of current or ex partner: Not on file    Emotionally abused: Not on file    Physically abused: Not on file    Forced sexual activity: Not on file  Other Topics Concern  . Not on file  Social History Narrative  . Not on file    FAMILY HISTORY: Family History  Problem Relation Age of Onset  . Hypertension Mother   . Diabetes Mother   . Diabetes Father   . Hypertension Father   . Mental illness  Father   . Arthritis Maternal Grandmother   . Cancer Maternal Grandmother   . Diabetes Maternal Grandmother   . Hypertension Maternal Grandmother   . Stroke Maternal Grandmother   . Stroke Maternal Grandfather   . Arthritis Paternal Grandmother   . Cancer Paternal Grandmother   . Diabetes Paternal Grandmother   . Hypertension Paternal Grandmother     ALLERGIES:  is allergic to sulfa antibiotics.  MEDICATIONS:  Current Outpatient Medications  Medication Sig Dispense Refill  . doxycycline (VIBRAMYCIN) 100 MG capsule Take 1 capsule (100 mg total) by mouth daily. 90 capsule 1  . hydrochlorothiazide (HYDRODIURIL) 25 MG tablet Take 1 tablet (25 mg total) by mouth daily. 90 tablet 3  .  iron polysaccharides (NIFEREX) 150 MG capsule Take 1 capsule (150 mg total) by mouth daily. 30 capsule 3  . mupirocin ointment (BACTROBAN) 2 % Apply 1 application topically 3 (three) times daily. 30 g 2   No current facility-administered medications for this visit.     REVIEW OF SYSTEMS:    A 10+ POINT REVIEW OF SYSTEMS WAS OBTAINED including neurology, dermatology, psychiatry, cardiac, respiratory, lymph, extremities, GI, GU, Musculoskeletal, constitutional, breasts, reproductive, HEENT.  All pertinent positives are noted in the HPI.  All others are negative.   PHYSICAL EXAMINATION:  There were no vitals filed for this visit. There were no vitals filed for this visit. .There is no height or weight on file to calculate BMI.  GENERAL:alert, in no acute distress and comfortable SKIN: no acute rashes, healing boil in left axilla EYES: conjunctiva are pink and non-injected, sclera anicteric OROPHARYNX: MMM, no exudates, no oropharyngeal erythema or ulceration NECK: supple, no JVD LYMPH:  no palpable lymphadenopathy in the cervical, axillary or inguinal regions LUNGS: clear to auscultation b/l with normal respiratory effort HEART: regular rate & rhythm ABDOMEN:  normoactive bowel sounds , non tender,  not distended. No palpable hepatosplenomegaly.  Extremity: no pedal edema PSYCH: alert & oriented x 3 with fluent speech NEURO: no focal motor/sensory deficits   LABORATORY DATA:  I have reviewed the data as listed     11/15/17      . CBC Latest Ref Rng & Units 06/26/2018 06/17/2018 05/01/2018  WBC 4.0 - 10.5 K/uL 16.2(H) 22.4(H) 10.8(H)  Hemoglobin 12.0 - 15.0 g/dL 11.7(L) 11.3(L) 10.7(L)  Hematocrit 36.0 - 46.0 % 35.3(L) 35.2(L) 33.2(L)  Platelets 150 - 400 K/uL 345 333 353    . CMP Latest Ref Rng & Units 06/26/2018 06/17/2018 05/01/2018  Glucose 70 - 99 mg/dL 89 90 88  BUN 6 - 20 mg/dL 14 11 12   Creatinine 0.44 - 1.00 mg/dL 0.90 0.77 1.05(H)  Sodium 135 - 145 mmol/L 139 130(L) 141  Potassium 3.5 - 5.1 mmol/L 3.7 3.4(L) 3.8  Chloride 98 - 111 mmol/L 103 99 108  CO2 22 - 32 mmol/L 25 22 24   Calcium 8.9 - 10.3 mg/dL 9.6 8.8(L) 8.8(L)  Total Protein 6.5 - 8.1 g/dL 8.5(H) - 7.7  Total Bilirubin 0.3 - 1.2 mg/dL 0.3 - <0.2(L)  Alkaline Phos 38 - 126 U/L 61 - 59  AST 15 - 41 U/L 13(L) - 14(L)  ALT 0 - 44 U/L 14 - 10   . Lab Results  Component Value Date   LDH 128 05/01/2018   Component     Latest Ref Rng & Units 11/26/2017  Adamts 13 Activity     >66.8 % 69.0  ADAMTS13 Activity comment      Comment    RADIOGRAPHIC STUDIES: I have personally reviewed the radiological images as listed and agreed with the findings in the report. No results found.  ASSESSMENT & PLAN:   32 y.o. female with hydradenitis suppurativa and   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  Severely low ADAMTS 13 levels of 6 on diagnosis. Now normalized to 69 ( ADAMTS13 from 11/26/17 ) S/p Plasmapheresis x 5. Completed Rituxan weekly x 4 doses.  PLAN:  -Discussed pt labwork today, 06/26/18; WBC at 16.2k with ANC at 9.6k and Lymphs at 5.7k in setting of recent infection and hydradenitis suppurativa, and is improving on doxycycline. PLT remain normal at 345k.  -06/26/18  Ferritin, LDH are as noted above -No indication of TTP relapse at this  time -Recommend PCP consider OBGYN referral for menorrhagia  -Continue 150mg  Iron Polysaccharide as she is having heavier periods. Recommend taking with orange juice  -Continue Vitamin B complex and folic acid. -Will see the pt back in 2 months   2. RUE swelling -Right Upper Extremity Venous US from 12/10/17 showed no evidence of DVT Final Interpretation: Right: No evidence of deep vein thrombosis in the upper extremity. No evidence of superficial vein thrombosis in the upper extremity. Left: No evidence of thrombosis in the subclavian.   RTC with Dr Irene Limbo with labs in 65months   All of the patients questions were answered with apparent satisfaction. The patient knows to call the clinic with any problems, questions or concerns.  The total time spent in the appt was 20 minutes and more than 50% was on counseling and direct patient cares.   Sullivan Lone MD MS AAHIVMS [facility] Hematology/Oncology Physician [facility]  (Office):       [phone] (Work cell):  [phone] (Fax):           [phone]  06/25/2018 2:29 PM  I, Baldwin Jamaica, am acting as a scribe for Dr. Sullivan Lone.   .I have reviewed the above documentation for accuracy and completeness, and I agree with the above. Brunetta Genera MD

## 2018-06-26 ENCOUNTER — Telehealth: Payer: Self-pay

## 2018-06-26 ENCOUNTER — Inpatient Hospital Stay: Payer: Medicaid Other | Attending: Hematology

## 2018-06-26 ENCOUNTER — Inpatient Hospital Stay (HOSPITAL_BASED_OUTPATIENT_CLINIC_OR_DEPARTMENT_OTHER): Payer: Medicaid Other | Admitting: Hematology

## 2018-06-26 VITALS — BP 138/98 | HR 101 | Temp 97.8°F | Resp 18 | Ht 67.0 in | Wt 224.7 lb

## 2018-06-26 DIAGNOSIS — M311 Thrombotic microangiopathy: Secondary | ICD-10-CM

## 2018-06-26 DIAGNOSIS — Z87891 Personal history of nicotine dependence: Secondary | ICD-10-CM

## 2018-06-26 DIAGNOSIS — D509 Iron deficiency anemia, unspecified: Secondary | ICD-10-CM

## 2018-06-26 DIAGNOSIS — Z7189 Other specified counseling: Secondary | ICD-10-CM

## 2018-06-26 DIAGNOSIS — M3119 Other thrombotic microangiopathy: Secondary | ICD-10-CM

## 2018-06-26 LAB — CMP (CANCER CENTER ONLY)
ALT: 14 U/L (ref 0–44)
AST: 13 U/L — ABNORMAL LOW (ref 15–41)
Albumin: 3.7 g/dL (ref 3.5–5.0)
Alkaline Phosphatase: 61 U/L (ref 38–126)
Anion gap: 11 (ref 5–15)
BILIRUBIN TOTAL: 0.3 mg/dL (ref 0.3–1.2)
BUN: 14 mg/dL (ref 6–20)
CO2: 25 mmol/L (ref 22–32)
CREATININE: 0.9 mg/dL (ref 0.44–1.00)
Calcium: 9.6 mg/dL (ref 8.9–10.3)
Chloride: 103 mmol/L (ref 98–111)
GFR, Est AFR Am: >60 mL/min (ref >60)
Glucose, Bld: 89 mg/dL (ref 70–99)
Potassium: 3.7 mmol/L (ref 3.5–5.1)
Sodium: 139 mmol/L (ref 135–145)
Total Protein: 8.5 g/dL — ABNORMAL HIGH (ref 6.5–8.1)

## 2018-06-26 LAB — CBC WITH DIFFERENTIAL/PLATELET
Abs Immature Granulocytes: 0.06 10*3/uL (ref 0.00–0.07)
Basophils Absolute: 0.1 10*3/uL (ref 0.0–0.1)
Basophils Relative: 0 %
EOS ABS: 0.1 10*3/uL (ref 0.0–0.5)
Eosinophils Relative: 1 %
HCT: 35.3 % — ABNORMAL LOW (ref 36.0–46.0)
Hemoglobin: 11.7 g/dL — ABNORMAL LOW (ref 12.0–15.0)
Immature Granulocytes: 0 %
Lymphocytes Relative: 35 %
Lymphs Abs: 5.7 10*3/uL — ABNORMAL HIGH (ref 0.7–4.0)
MCH: 27.1 pg (ref 26.0–34.0)
MCHC: 33.1 g/dL (ref 30.0–36.0)
MCV: 81.9 fL (ref 80.0–100.0)
Monocytes Absolute: 0.6 10*3/uL (ref 0.1–1.0)
Monocytes Relative: 4 %
Neutro Abs: 9.6 10*3/uL — ABNORMAL HIGH (ref 1.7–7.7)
Neutrophils Relative %: 60 %
Platelets: 345 10*3/uL (ref 150–400)
RBC: 4.31 MIL/uL (ref 3.87–5.11)
RDW: 13.4 % (ref 11.5–15.5)
WBC: 16.2 10*3/uL — AB (ref 4.0–10.5)
nRBC: 0 % (ref 0.0–0.2)

## 2018-06-26 LAB — IRON AND TIBC
Iron: 33 ug/dL — ABNORMAL LOW (ref 41–142)
Saturation Ratios: 9 % — ABNORMAL LOW (ref 21–57)
TIBC: 346 ug/dL (ref 236–444)
UIBC: 314 ug/dL (ref 120–384)

## 2018-06-26 LAB — LACTATE DEHYDROGENASE: LDH: 143 U/L (ref 98–192)

## 2018-06-26 LAB — RETICULOCYTES
Immature Retic Fract: 17.6 % — ABNORMAL HIGH (ref 2.3–15.9)
RBC.: 4.31 MIL/uL (ref 3.87–5.11)
Retic Count, Absolute: 71.1 10*3/uL (ref 19.0–186.0)
Retic Ct Pct: 1.7 % (ref 0.4–3.1)

## 2018-06-26 LAB — FERRITIN: Ferritin: 39 ng/mL (ref 11–307)

## 2018-06-26 NOTE — Telephone Encounter (Signed)
Printed avs and calender of upcoming appointment. Per 12/2 los 

## 2018-07-16 ENCOUNTER — Telehealth: Payer: Self-pay | Admitting: Family Medicine

## 2018-07-16 NOTE — Telephone Encounter (Signed)
Patient called stating that she had some questions for the nurse, patient did not specify what or go into further detail. Please follow up.

## 2018-07-16 NOTE — Telephone Encounter (Signed)
Left voice mail to call back 

## 2018-07-23 NOTE — Telephone Encounter (Signed)
Left voice mail to call back 

## 2018-07-29 NOTE — Telephone Encounter (Signed)
While on the phone patient also mentions that she discussed heavy menstrual bleeding during her appointment last month at Oncology. She states that she is having really bad cramps & fully soaking a pad in 30-45 minutes. She is having to wear adult diapers while at work. I advised her that she would likely need to be seen since this wasn't discussed with PCP but she would like me to send message to PCP just in case since visit notes are in Epic.

## 2018-07-29 NOTE — Telephone Encounter (Signed)
Patient states that she has been on the Doxycycline for almost 2 months & she is still having the recurring boils. I gave patient the number to [facility] since they've left multiple voicemails to schedule an appointment.

## 2018-07-29 NOTE — Telephone Encounter (Signed)
Patient will need to schedule an appointment to be seen in office for evaluation. She will likely require a referral to OBGYN however, this issue was not worked up during a prior visit

## 2018-07-30 NOTE — Telephone Encounter (Signed)
Left voice mail to call back 

## 2018-08-01 NOTE — Telephone Encounter (Signed)
Patient needs to make an appointment with Ms. Harris.

## 2018-08-13 ENCOUNTER — Ambulatory Visit: Payer: Medicaid Other | Admitting: Family Medicine

## 2018-08-20 NOTE — Progress Notes (Signed)
HEMATOLOGY/ONCOLOGY CONSULTATION NOTE  Date of Service: 08/21/2018  Patient Care Team: Scot Jun, FNP as PCP - General (Family Medicine)  CHIEF COMPLAINTS/PURPOSE OF CONSULTATION:  Thrombotic microangiopathy  HISTORY OF PRESENTING ILLNESS:   Sarah Washington is a wonderful 33 y.o. female who has been referred to [place] by Dr Mabeline Caras for evaluation and management of recently diagnosed acquired TTP/Thrombotic microangiopathy. She is accompanied today by her three children. The pt reports that she is doing well overall.   The pt reports that in January 2019 she began feeling weak, tired, and developed a newly heavy menstrual cycle. She also sites bruising easily that began occurring in January 2019 but denies nose bleeds, gum bleeds, and blood in the stools at the time. She notes that she first had labs to evaluate these symptoms She noted finger tip neuropathy, severe headache, abdominal pains, and tingling in her feet. She denies any infections at the time, PO contraceptives, hormone therapy, and new medications at the time. She also denies recent blood transfusions as well. She was discharged with Atovaquone (for PCP prophylaxis). 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  The pt presented to [facility] on 11/02/17 for TTP.  Her last plasma exchange was on 11/07/17. She received 5 total plasma exchanges. She received her third Rituxan infusion on 11/15/17, and only notes some mild itching but denies any rashes with her infusion. She has not yet received her fourth dose or Rituxan as she has moved from [facility] to [place]. She has been taking 30mg  Prednisone since 11/15/17.  She adds that she has gained 20 pounds of weight, noting that she has a very strong appetite while taking steroids.    The pt has previously taken BP medication for  her HTN.  She received 5 units of blood, and no plasma exchange after delivery of her third child was premature at 5 months due to HELLP syndrome.   Most recent lab results (11/15/17) of CBC  is as follows: all values are WNL except for WBC at 12.4k, RBC at 3.69, HGB at 11.8, RDW at 22.3, MPV at 6.9, PLT at 137k.  On review of systems, pt reports increased appetite, weight gain, normalized menstrual bleeding, and denies headaches, peripheral neuropathy, fevers, chills, night sweats, back pains, abdominal pains, changes in bowel habits, and any other symptoms.   On PMHx the pt reports HTN, tubal ligation in 2010, HELLP syndrome with third pregnancy requiring delivery after 5 months. Angioedema reaction to sulfa drugs.  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  On Family Hx the pt denies any blood, cancer, or autoimmune disorders.  Interval History:    Sarah Washington returns today regarding management and evaluation of her TTP. The patient's last visit with [place] was on 06/26/18. She is accompanied today by her son. The pt reports that she is doing well overall.   The pt reports that she has gained 17 pounds in the interim. She has switched from 1st shift to 3rd shift, and notes that her daily rhythm has been affected by this. She feels that both of her legs feel tight to her, and that her ankles are swollen. She notes that her legs are better after waking up.   She notes that she has had recent acid reflux while sleeping, and has begun taking Protonix again.   The pt  notes that her periods continue to be heavy. She notes that she has been taking her iron pills and is almost out. She notes that she has fibroids, is not on any medication for this, and has not seen an OBGYN in the interim. She will be seeing her PCP Molli Barrows, FNP soon and will discuss an OBGYN referral.  Lab results today (08/21/18) of CBC w/diff, Reticulocytes, and CMP is as follows: all values are WNL except for HGB  at 10.7, HCT at 33.4, Immature retic fract at 17.8%, Calcium at 8.4, Albumin at 3.3, AST at 13. 08/21/18 LDH is . Lab Results  Component Value Date   LDH 194 (H) 08/21/2018   08/21/18 Ferritin is . Lab Results  Component Value Date   IRON 33 (L) 06/26/2018   TIBC 346 06/26/2018   IRONPCTSAT 9 (L) 06/26/2018   (Iron and TIBC)  Lab Results  Component Value Date   FERRITIN 16 08/21/2018     On review of systems, pt reports heartburns, weight gain, bilateral ankle swelling, eating well, and denies concerns for infections, abdominal pains, and any other symptoms.   MEDICAL HISTORY:   HTN, tubal ligation in 2010, HELLP syndrome with third pregnancy requiring delivery after 5 months. Angioedema reaction to sulfa drugs.   SURGICAL HISTORY: tubal ligation in 2010  SOCIAL HISTORY: Social History   Socioeconomic History  . Marital status: Single    Spouse name: Not on file  . Number of children: 3  . Years of education: Not on file  . Highest education level: Not on file  Occupational History  . Not on file  Social Needs  . Financial resource strain: Not on file  . Food insecurity:    Worry: Not on file    Inability: Not on file  . Transportation needs:    Medical: Not on file    Non-medical: Not on file  Tobacco Use  . Smoking status: Former Smoker    Types: Cigarettes    Last attempt to quit: 10/2017    Years since quitting: 0.8  . Smokeless tobacco: Never Used  Substance and Sexual Activity  . Alcohol use: Yes  . Drug use: Never  . Sexual activity: Yes  Lifestyle  . Physical activity:    Days per week: Not on file    Minutes per session: Not on file  . Stress: Not on file  Relationships  . Social connections:    Talks on phone: Not on file    Gets together: Not on file    Attends religious service: Not on file    Active member of club or organization: Not on file    Attends meetings of clubs or organizations: Not on file    Relationship status: Not on file   . Intimate partner violence:    Fear of current or ex partner: Not on file    Emotionally abused: Not on file    Physically abused: Not on file    Forced sexual activity: Not on file  Other Topics Concern  . Not on file  Social History Narrative  . Not on file    FAMILY HISTORY: Family History  Problem Relation Age of Onset  . Hypertension Mother   . Diabetes Mother   . Diabetes Father   . Hypertension Father   . Mental illness Father   . Arthritis Maternal Grandmother   . Cancer Maternal Grandmother   . Diabetes Maternal Grandmother   . Hypertension Maternal Grandmother   .  Stroke Maternal Grandmother   . Stroke Maternal Grandfather   . Arthritis Paternal Grandmother   . Cancer Paternal Grandmother   . Diabetes Paternal Grandmother   . Hypertension Paternal Grandmother     ALLERGIES:  is allergic to sulfa antibiotics.  MEDICATIONS:  Current Outpatient Medications  Medication Sig Dispense Refill  . doxycycline (VIBRAMYCIN) 100 MG capsule Take 1 capsule (100 mg total) by mouth daily. 90 capsule 1  . hydrochlorothiazide (HYDRODIURIL) 25 MG tablet Take 1 tablet (25 mg total) by mouth daily. 90 tablet 3  . iron polysaccharides (NIFEREX) 150 MG capsule Take 1 capsule (150 mg total) by mouth daily. 30 capsule 3  . mupirocin ointment (BACTROBAN) 2 % Apply 1 application topically 3 (three) times daily. 30 g 2   No current facility-administered medications for this visit.     REVIEW OF SYSTEMS:    A 10+ POINT REVIEW OF SYSTEMS WAS OBTAINED including neurology, dermatology, psychiatry, cardiac, respiratory, lymph, extremities, GI, GU, Musculoskeletal, constitutional, breasts, reproductive, HEENT.  All pertinent positives are noted in the HPI.  All others are negative.   PHYSICAL EXAMINATION:  Vitals:   08/21/18 0925  BP: (!) 127/91  Pulse: 89  Resp: 18  Temp: 98.6 F (37 C)  SpO2: 100%   Filed Weights   08/21/18 0925  Weight: 241 lb 3.2 oz (109.4 kg)   .Body  mass index is 37.78 kg/m.  GENERAL:alert, in no acute distress and comfortable SKIN: no acute rashes, no significant lesions EYES: conjunctiva are pink and non-injected, sclera anicteric OROPHARYNX: MMM, no exudates, no oropharyngeal erythema or ulceration NECK: supple, no JVD LYMPH:  no palpable lymphadenopathy in the cervical, axillary or inguinal regions LUNGS: clear to auscultation b/l with normal respiratory effort HEART: regular rate & rhythm ABDOMEN:  normoactive bowel sounds , non tender, not distended. No palpable hepatosplenomegaly.  Extremity: 1+ pedal edema PSYCH: alert & oriented x 3 with fluent speech NEURO: no focal motor/sensory deficits   LABORATORY DATA:  I have reviewed the data as listed     11/15/17      . CBC Latest Ref Rng & Units 08/21/2018 06/26/2018 06/17/2018  WBC 4.0 - 10.5 K/uL 8.3 16.2(H) 22.4(H)  Hemoglobin 12.0 - 15.0 g/dL 10.7(L) 11.7(L) 11.3(L)  Hematocrit 36.0 - 46.0 % 33.4(L) 35.3(L) 35.2(L)  Platelets 150 - 400 K/uL 329 345 333    . CMP Latest Ref Rng & Units 08/21/2018 06/26/2018 06/17/2018  Glucose 70 - 99 mg/dL 87 89 90  BUN 6 - 20 mg/dL 12 14 11   Creatinine 0.44 - 1.00 mg/dL 0.84 0.90 0.77  Sodium 135 - 145 mmol/L 137 139 130(L)  Potassium 3.5 - 5.1 mmol/L 3.6 3.7 3.4(L)  Chloride 98 - 111 mmol/L 105 103 99  CO2 22 - 32 mmol/L 23 25 22   Calcium 8.9 - 10.3 mg/dL 8.4(L) 9.6 8.8(L)  Total Protein 6.5 - 8.1 g/dL 7.9 8.5(H) -  Total Bilirubin 0.3 - 1.2 mg/dL 0.3 0.3 -  Alkaline Phos 38 - 126 U/L 57 61 -  AST 15 - 41 U/L 13(L) 13(L) -  ALT 0 - 44 U/L 16 14 -   . Lab Results  Component Value Date   LDH 194 (H) 08/21/2018   Component     Latest Ref Rng & Units 11/26/2017  Adamts 13 Activity     >66.8 % 69.0  ADAMTS13 Activity comment      Comment    RADIOGRAPHIC STUDIES: I have personally reviewed the radiological images as listed and  agreed with the findings in the report. No results found.  ASSESSMENT & PLAN:   33 y.o.  female with hydradenitis suppurativa and   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  Severely low ADAMTS 13 levels of 6 on diagnosis. Now normalized to 69 ( ADAMTS13 from 11/26/17 ) S/p Plasmapheresis x 5. Completed Rituxan weekly x 4 doses.  PLAN:  -Discussed pt labwork today, 08/21/18; WBC normalized to 8.3k. HGB slightly lower at 10.7. PLT stable at 329k. LDH at 194. -08/21/18 Ferritin is . Lab Results  Component Value Date   IRON 33 (L) 06/26/2018   TIBC 346 06/26/2018   IRONPCTSAT 9 (L) 06/26/2018   (Iron and TIBC)  Lab Results  Component Value Date   FERRITIN 16 08/21/2018    -No indication of TTP relapse at this time -Mild anemia related to menorrhagia, advise PCP refer pt to OBGYN -Cut down on salt intake and speak with PCP about weight gain -Recommend using graded sports compression socks and feet elevation -Recommend eating 2 hours before going to bed. Wedge pillow or raise head of bed. Tums as needed. -Continue PO 150mg  Iron Polysaccharidepo BID with orange juice -Continue Vitamin B complex and folic acid. -Will see the pt back in 3 months  2. RUE swelling -Right Upper Extremity Venous US from 12/10/17 showed no evidence of DVT Final Interpretation: Right: No evidence of deep vein thrombosis in the upper extremity. No evidence of superficial vein thrombosis in the upper extremity. Left: No evidence of thrombosis in the subclavian.   RTC with Dr Irene Limbo with labs in 3 months   All of the patients questions were answered with apparent satisfaction. The patient knows to call the clinic with any problems, questions or concerns.  The total time spent in the appt was 25 minutes and more than 50% was on counseling and direct patient cares.   Sullivan Lone MD MS AAHIVMS [facility] Hematology/Oncology Physician [facility]  (Office):       [phone] (Work cell):  [phone] (Fax):           [phone]  08/21/2018 10:07 AM  I,  Baldwin Jamaica, am acting as a scribe for Dr. Sullivan Lone.   .I have reviewed the above documentation for accuracy and completeness, and I agree with the above. Brunetta Genera MD

## 2018-08-21 ENCOUNTER — Inpatient Hospital Stay: Payer: Medicaid Other | Attending: Hematology | Admitting: Hematology

## 2018-08-21 ENCOUNTER — Inpatient Hospital Stay: Payer: Medicaid Other

## 2018-08-21 ENCOUNTER — Telehealth: Payer: Self-pay | Admitting: Hematology

## 2018-08-21 ENCOUNTER — Other Ambulatory Visit: Payer: Self-pay

## 2018-08-21 VITALS — BP 127/91 | HR 89 | Temp 98.6°F | Resp 18 | Ht 67.0 in | Wt 241.2 lb

## 2018-08-21 DIAGNOSIS — D509 Iron deficiency anemia, unspecified: Secondary | ICD-10-CM

## 2018-08-21 DIAGNOSIS — D649 Anemia, unspecified: Secondary | ICD-10-CM

## 2018-08-21 DIAGNOSIS — N920 Excessive and frequent menstruation with regular cycle: Secondary | ICD-10-CM

## 2018-08-21 DIAGNOSIS — Z87891 Personal history of nicotine dependence: Secondary | ICD-10-CM

## 2018-08-21 DIAGNOSIS — M3119 Other thrombotic microangiopathy: Secondary | ICD-10-CM

## 2018-08-21 DIAGNOSIS — R635 Abnormal weight gain: Secondary | ICD-10-CM

## 2018-08-21 DIAGNOSIS — M311 Thrombotic microangiopathy: Secondary | ICD-10-CM | POA: Diagnosis not present

## 2018-08-21 DIAGNOSIS — D500 Iron deficiency anemia secondary to blood loss (chronic): Secondary | ICD-10-CM

## 2018-08-21 LAB — CBC WITH DIFFERENTIAL/PLATELET
Abs Immature Granulocytes: 0.02 10*3/uL (ref 0.00–0.07)
BASOS ABS: 0 10*3/uL (ref 0.0–0.1)
Basophils Relative: 1 %
Eosinophils Absolute: 0.2 10*3/uL (ref 0.0–0.5)
Eosinophils Relative: 2 %
HCT: 33.4 % — ABNORMAL LOW (ref 36.0–46.0)
Hemoglobin: 10.7 g/dL — ABNORMAL LOW (ref 12.0–15.0)
Immature Granulocytes: 0 %
Lymphocytes Relative: 39 %
Lymphs Abs: 3.3 10*3/uL (ref 0.7–4.0)
MCH: 27.5 pg (ref 26.0–34.0)
MCHC: 32 g/dL (ref 30.0–36.0)
MCV: 85.9 fL (ref 80.0–100.0)
MONO ABS: 0.5 10*3/uL (ref 0.1–1.0)
Monocytes Relative: 5 %
Neutro Abs: 4.4 10*3/uL (ref 1.7–7.7)
Neutrophils Relative %: 53 %
Platelets: 329 10*3/uL (ref 150–400)
RBC: 3.89 MIL/uL (ref 3.87–5.11)
RDW: 14.7 % (ref 11.5–15.5)
WBC: 8.3 10*3/uL (ref 4.0–10.5)
nRBC: 0 % (ref 0.0–0.2)

## 2018-08-21 LAB — RETICULOCYTES
Immature Retic Fract: 17.8 % — ABNORMAL HIGH (ref 2.3–15.9)
RBC.: 3.89 MIL/uL (ref 3.87–5.11)
Retic Count, Absolute: 73.9 10*3/uL (ref 19.0–186.0)
Retic Ct Pct: 1.9 % (ref 0.4–3.1)

## 2018-08-21 LAB — CMP (CANCER CENTER ONLY)
ALT: 16 U/L (ref 0–44)
AST: 13 U/L — ABNORMAL LOW (ref 15–41)
Albumin: 3.3 g/dL — ABNORMAL LOW (ref 3.5–5.0)
Alkaline Phosphatase: 57 U/L (ref 38–126)
Anion gap: 9 (ref 5–15)
BUN: 12 mg/dL (ref 6–20)
CHLORIDE: 105 mmol/L (ref 98–111)
CO2: 23 mmol/L (ref 22–32)
Calcium: 8.4 mg/dL — ABNORMAL LOW (ref 8.9–10.3)
Creatinine: 0.84 mg/dL (ref 0.44–1.00)
GFR, Est AFR Am: >60 mL/min (ref >60)
GFR, Estimated: >60 mL/min (ref >60)
Glucose, Bld: 87 mg/dL (ref 70–99)
Potassium: 3.6 mmol/L (ref 3.5–5.1)
Sodium: 137 mmol/L (ref 135–145)
Total Bilirubin: 0.3 mg/dL (ref 0.3–1.2)
Total Protein: 7.9 g/dL (ref 6.5–8.1)

## 2018-08-21 LAB — LACTATE DEHYDROGENASE: LDH: 194 U/L — AB (ref 98–192)

## 2018-08-21 LAB — FERRITIN: FERRITIN: 16 ng/mL (ref 11–307)

## 2018-08-21 NOTE — Telephone Encounter (Signed)
Gave avs and calendar ° °

## 2018-08-30 ENCOUNTER — Telehealth: Payer: Self-pay | Admitting: Family Medicine

## 2018-08-30 ENCOUNTER — Other Ambulatory Visit: Payer: Self-pay | Admitting: Family Medicine

## 2018-08-30 DIAGNOSIS — N920 Excessive and frequent menstruation with regular cycle: Secondary | ICD-10-CM

## 2018-08-30 NOTE — Telephone Encounter (Signed)
Contact patient to advise, I will place a referral for her to gynecology. New problems routinely require an office visit, however, given recent COVID crisis and restrictions on visits, I will place the referral. Advise patient, there may be some delay in having her appointment scheduled as most practices are scheduling early May for appointments.   Please cancer 09/05/18 appointment. Patient will need a routine hypertension follow-up in August.

## 2018-08-30 NOTE — Telephone Encounter (Signed)
Patient notified of the following information. Expressed understanding. States that she would like to keep her BP appointment that scheduled in July.

## 2018-09-05 ENCOUNTER — Ambulatory Visit: Payer: Medicaid Other | Admitting: Family Medicine

## 2018-11-13 ENCOUNTER — Telehealth: Payer: Self-pay | Admitting: *Deleted

## 2018-11-13 NOTE — Telephone Encounter (Signed)
MR faxed to [facility] Records - Release: [id number]

## 2018-11-20 NOTE — Progress Notes (Signed)
HEMATOLOGY/ONCOLOGY CONSULTATION NOTE  Date of Service: 11/21/2018  Patient Care Team: Scot Jun, FNP as PCP - General (Family Medicine)  CHIEF COMPLAINTS/PURPOSE OF CONSULTATION:  Thrombotic microangiopathy  HISTORY OF PRESENTING ILLNESS:   Sarah Washington is a wonderful 33 y.o. female who has been referred to [place] by Dr Mabeline Caras for evaluation and management of recently diagnosed acquired TTP/Thrombotic microangiopathy. She is accompanied today by her three children. The pt reports that she is doing well overall.   The pt reports that in January 2019 she began feeling weak, tired, and developed a newly heavy menstrual cycle. She also sites bruising easily that began occurring in January 2019 but denies nose bleeds, gum bleeds, and blood in the stools at the time. She notes that she first had labs to evaluate these symptoms She noted finger tip neuropathy, severe headache, abdominal pains, and tingling in her feet. She denies any infections at the time, PO contraceptives, hormone therapy, and new medications at the time. She also denies recent blood transfusions as well. She was discharged with Atovaquone (for PCP prophylaxis). 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  The pt presented to [facility] on 11/02/17 for TTP.  Her last plasma exchange was on 11/07/17. She received 5 total plasma exchanges. She received her third Rituxan infusion on 11/15/17, and only notes some mild itching but denies any rashes with her infusion. She has not yet received her fourth dose or Rituxan as she has moved from [facility] to [place]. She has been taking 30mg  Prednisone since 11/15/17.  She adds that she has gained 20 pounds of weight, noting that she has a very strong appetite while taking steroids.    The pt has previously taken BP medication for  her HTN.  She received 5 units of blood, and no plasma exchange after delivery of her third child was premature at 5 months due to HELLP syndrome.   Most recent lab results (11/15/17) of CBC  is as follows: all values are WNL except for WBC at 12.4k, RBC at 3.69, HGB at 11.8, RDW at 22.3, MPV at 6.9, PLT at 137k.  On review of systems, pt reports increased appetite, weight gain, normalized menstrual bleeding, and denies headaches, peripheral neuropathy, fevers, chills, night sweats, back pains, abdominal pains, changes in bowel habits, and any other symptoms.   On PMHx the pt reports HTN, tubal ligation in 2010, HELLP syndrome with third pregnancy requiring delivery after 5 months. Angioedema reaction to sulfa drugs.  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  On Family Hx the pt denies any blood, cancer, or autoimmune disorders.  Interval History:    Vila Dory returns today regarding management and evaluation of her TTP. The patient's last visit with [place] was on 08/21/18. The pt reports that she is doing well overall.  The pt reports that she has been following up with her PCP and has been recommended to begin Humira for her Hydradenitis. Per pt, PCP would like to rule out this having a bearing upon the pt's past medical history of TTP.   The pt denies any new concerns since our last visit. She notes that she has continued taking her PO Iron polysaccharide every day and denies problems tolerating this. She denies any concerns for abnormal bleeding at this time.  Lab results today (11/21/18) of CBC w/diff,  Reticulocytes is as follows: all values are WNL except for HGB at 11.7. 11/21/18 CMP is stable. 11/21/18 Ferritin is 19  On review of systems, pt reports stable energy levels, and denies concerns for infections, fevers, chills, abdominal pains, leg swelling, abnormal bleeding, and any other symptoms.   MEDICAL HISTORY:   HTN, tubal ligation in 2010, HELLP syndrome with third  pregnancy requiring delivery after 5 months. Angioedema reaction to sulfa drugs.   SURGICAL HISTORY: tubal ligation in 2010  SOCIAL HISTORY: Social History   Socioeconomic History  . Marital status: Single    Spouse name: Not on file  . Number of children: 3  . Years of education: Not on file  . Highest education level: Not on file  Occupational History  . Not on file  Social Needs  . Financial resource strain: Not on file  . Food insecurity    Worry: Not on file    Inability: Not on file  . Transportation needs    Medical: Not on file    Non-medical: Not on file  Tobacco Use  . Smoking status: Former Smoker    Types: Cigarettes    Quit date: 10/2017    Years since quitting: 1.1  . Smokeless tobacco: Never Used  Substance and Sexual Activity  . Alcohol use: Yes  . Drug use: Never  . Sexual activity: Yes  Lifestyle  . Physical activity    Days per week: Not on file    Minutes per session: Not on file  . Stress: Not on file  Relationships  . Social Herbalist on phone: Not on file    Gets together: Not on file    Attends religious service: Not on file    Active member of club or organization: Not on file    Attends meetings of clubs or organizations: Not on file    Relationship status: Not on file  . Intimate partner violence    Fear of current or ex partner: Not on file    Emotionally abused: Not on file    Physically abused: Not on file    Forced sexual activity: Not on file  Other Topics Concern  . Not on file  Social History Narrative  . Not on file    FAMILY HISTORY: Family History  Problem Relation Age of Onset  . Hypertension Mother   . Diabetes Mother   . Diabetes Father   . Hypertension Father   . Mental illness Father   . Arthritis Maternal Grandmother   . Cancer Maternal Grandmother   . Diabetes Maternal Grandmother   . Hypertension Maternal Grandmother   . Stroke Maternal Grandmother   . Stroke Maternal Grandfather   .  Arthritis Paternal Grandmother   . Cancer Paternal Grandmother   . Diabetes Paternal Grandmother   . Hypertension Paternal Grandmother     ALLERGIES:  is allergic to sulfa antibiotics.  MEDICATIONS:  Current Outpatient Medications  Medication Sig Dispense Refill  . doxycycline (VIBRAMYCIN) 100 MG capsule Take 1 capsule (100 mg total) by mouth daily. 90 capsule 1  . hydrochlorothiazide (HYDRODIURIL) 25 MG tablet Take 1 tablet (25 mg total) by mouth daily. 90 tablet 3  . iron polysaccharides (NIFEREX) 150 MG capsule Take 1 capsule (150 mg total) by mouth daily. 30 capsule 3  . mupirocin ointment (BACTROBAN) 2 % Apply 1 application topically 3 (three) times daily. 30 g 2   No current facility-administered medications for this visit.     REVIEW  OF SYSTEMS:    A 10+ POINT REVIEW OF SYSTEMS WAS OBTAINED including neurology, dermatology, psychiatry, cardiac, respiratory, lymph, extremities, GI, GU, Musculoskeletal, constitutional, breasts, reproductive, HEENT.  All pertinent positives are noted in the HPI.  All others are negative.   PHYSICAL EXAMINATION:  Vitals:   11/21/18 0945  BP: (!) 125/92  Pulse: 81  Resp: 18  Temp: 98.7 F (37.1 C)  SpO2: 100%   Filed Weights   11/21/18 0945  Weight: 237 lb 6.4 oz (107.7 kg)   Body mass index is 37.18 kg/m.  GENERAL:alert, in no acute distress and comfortable SKIN: no acute rashes, no significant lesions EYES: conjunctiva are pink and non-injected, sclera anicteric OROPHARYNX: MMM, no exudates, no oropharyngeal erythema or ulceration NECK: supple, no JVD LYMPH:  no palpable lymphadenopathy in the cervical, axillary or inguinal regions LUNGS: clear to auscultation b/l with normal respiratory effort HEART: regular rate & rhythm ABDOMEN:  normoactive bowel sounds , non tender, not distended. No palpable hepatosplenomegaly.  Extremity: no pedal edema PSYCH: alert & oriented x 3 with fluent speech NEURO: no focal motor/sensory deficits    LABORATORY DATA:  I have reviewed the data as listed     11/15/17      . CBC Latest Ref Rng & Units 11/21/2018 08/21/2018 06/26/2018  WBC 4.0 - 10.5 K/uL 8.1 8.3 16.2(H)  Hemoglobin 12.0 - 15.0 g/dL 11.7(L) 10.7(L) 11.7(L)  Hematocrit 36.0 - 46.0 % 36.5 33.4(L) 35.3(L)  Platelets 150 - 400 K/uL 337 329 345    . CMP Latest Ref Rng & Units 08/21/2018 06/26/2018 06/17/2018  Glucose 70 - 99 mg/dL 87 89 90  BUN 6 - 20 mg/dL 12 14 11   Creatinine 0.44 - 1.00 mg/dL 0.84 0.90 0.77  Sodium 135 - 145 mmol/L 137 139 130(L)  Potassium 3.5 - 5.1 mmol/L 3.6 3.7 3.4(L)  Chloride 98 - 111 mmol/L 105 103 99  CO2 22 - 32 mmol/L 23 25 22   Calcium 8.9 - 10.3 mg/dL 8.4(L) 9.6 8.8(L)  Total Protein 6.5 - 8.1 g/dL 7.9 8.5(H) -  Total Bilirubin 0.3 - 1.2 mg/dL 0.3 0.3 -  Alkaline Phos 38 - 126 U/L 57 61 -  AST 15 - 41 U/L 13(L) 13(L) -  ALT 0 - 44 U/L 16 14 -   . Lab Results  Component Value Date   LDH 194 (H) 08/21/2018   Component     Latest Ref Rng & Units 11/26/2017  Adamts 13 Activity     >66.8 % 69.0  ADAMTS13 Activity comment      Comment    RADIOGRAPHIC STUDIES: I have personally reviewed the radiological images as listed and agreed with the findings in the report. No results found.  ASSESSMENT & PLAN:   33 y.o. female with hydradenitis suppurativa and   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  Severely low ADAMTS 13 levels of 6 on diagnosis. Now normalized to 69 ( ADAMTS13 from 11/26/17 ) S/p Plasmapheresis x 5. Completed Rituxan weekly x 4 doses.  PLAN:  -Discussed pt labwork today, 11/21/18; PLT normal, HGB improved to 11.7 -No indication of TTP relapse at this time -Discussed that I would not recommend beginning Humira at this time, with concern for immune suppression, her TTP. Would recommend exploring other options for treating her hydradenitis suppurativa. -Mild anemia has been improving, likely related to menorrhagia, advise PCP refer pt to  OBGYN -Cut down on salt intake and speak with PCP about weight gain -Recommend using graded sports compression  socks and feet elevation -Recommend eating 2 hours before going to bed. Wedge pillow or raise head of bed. Tums as needed. -Continue PO 150mg  Iron Polysaccharidepo BID with orange juice -Continue Vitamin B complex and folic acid. -Will see the pt back in 3 months  2. RUE swelling -Right Upper Extremity Venous US from 12/10/17 showed no evidence of DVT Final Interpretation: Right: No evidence of deep vein thrombosis in the upper extremity. No evidence of superficial vein thrombosis in the upper extremity. Left: No evidence of thrombosis in the subclavian.   RTC with Dr Irene Limbo with labs in 3 months   All of the patients questions were answered with apparent satisfaction. The patient knows to call the clinic with any problems, questions or concerns.  The total time spent in the appt was 15 minutes and more than 50% was on counseling and direct patient cares.   Sullivan Lone MD MS AAHIVMS [facility] Hematology/Oncology Physician [facility]  (Office):       [phone] (Work cell):  [phone] (Fax):           [phone]  11/21/2018 10:24 AM  I, Baldwin Jamaica, am acting as a scribe for Dr. Sullivan Lone.   .I have reviewed the above documentation for accuracy and completeness, and I agree with the above. Brunetta Genera MD

## 2018-11-21 ENCOUNTER — Inpatient Hospital Stay: Payer: Medicaid Other

## 2018-11-21 ENCOUNTER — Inpatient Hospital Stay: Payer: Medicaid Other | Attending: Hematology | Admitting: Hematology

## 2018-11-21 ENCOUNTER — Other Ambulatory Visit: Payer: Self-pay

## 2018-11-21 ENCOUNTER — Telehealth: Payer: Self-pay | Admitting: Hematology

## 2018-11-21 VITALS — BP 125/92 | HR 81 | Temp 98.7°F | Resp 18 | Ht 67.0 in | Wt 237.4 lb

## 2018-11-21 DIAGNOSIS — M3119 Other thrombotic microangiopathy: Secondary | ICD-10-CM

## 2018-11-21 DIAGNOSIS — L732 Hidradenitis suppurativa: Secondary | ICD-10-CM | POA: Diagnosis not present

## 2018-11-21 DIAGNOSIS — M311 Thrombotic microangiopathy: Secondary | ICD-10-CM

## 2018-11-21 DIAGNOSIS — D649 Anemia, unspecified: Secondary | ICD-10-CM | POA: Insufficient documentation

## 2018-11-21 DIAGNOSIS — D500 Iron deficiency anemia secondary to blood loss (chronic): Secondary | ICD-10-CM

## 2018-11-21 DIAGNOSIS — Z87891 Personal history of nicotine dependence: Secondary | ICD-10-CM

## 2018-11-21 LAB — CMP (CANCER CENTER ONLY)
ALT: 16 U/L (ref 0–44)
AST: 16 U/L (ref 15–41)
Albumin: 3.4 g/dL — ABNORMAL LOW (ref 3.5–5.0)
Alkaline Phosphatase: 61 U/L (ref 38–126)
Anion gap: 7 (ref 5–15)
BUN: 15 mg/dL (ref 6–20)
CO2: 24 mmol/L (ref 22–32)
Calcium: 8.6 mg/dL — ABNORMAL LOW (ref 8.9–10.3)
Chloride: 109 mmol/L (ref 98–111)
Creatinine: 1.07 mg/dL — ABNORMAL HIGH (ref 0.44–1.00)
GFR, Est AFR Am: >60 mL/min (ref >60)
GFR, Estimated: >60 mL/min (ref >60)
Glucose, Bld: 102 mg/dL — ABNORMAL HIGH (ref 70–99)
Potassium: 4.2 mmol/L (ref 3.5–5.1)
Sodium: 140 mmol/L (ref 135–145)
Total Bilirubin: <0.2 mg/dL — ABNORMAL LOW (ref 0.3–1.2)
Total Protein: 8 g/dL (ref 6.5–8.1)

## 2018-11-21 LAB — CBC WITH DIFFERENTIAL/PLATELET
Abs Immature Granulocytes: 0.05 10*3/uL (ref 0.00–0.07)
Basophils Absolute: 0 10*3/uL (ref 0.0–0.1)
Basophils Relative: 1 %
Eosinophils Absolute: 0.2 10*3/uL (ref 0.0–0.5)
Eosinophils Relative: 2 %
HCT: 36.5 % (ref 36.0–46.0)
Hemoglobin: 11.7 g/dL — ABNORMAL LOW (ref 12.0–15.0)
Immature Granulocytes: 1 %
Lymphocytes Relative: 40 %
Lymphs Abs: 3.3 10*3/uL (ref 0.7–4.0)
MCH: 27.3 pg (ref 26.0–34.0)
MCHC: 32.1 g/dL (ref 30.0–36.0)
MCV: 85.1 fL (ref 80.0–100.0)
Monocytes Absolute: 0.5 10*3/uL (ref 0.1–1.0)
Monocytes Relative: 6 %
Neutro Abs: 4.1 10*3/uL (ref 1.7–7.7)
Neutrophils Relative %: 50 %
Platelets: 337 10*3/uL (ref 150–400)
RBC: 4.29 MIL/uL (ref 3.87–5.11)
RDW: 13.9 % (ref 11.5–15.5)
WBC: 8.1 10*3/uL (ref 4.0–10.5)
nRBC: 0 % (ref 0.0–0.2)

## 2018-11-21 LAB — RETICULOCYTES
Immature Retic Fract: 14.7 % (ref 2.3–15.9)
RBC.: 4.25 MIL/uL (ref 3.87–5.11)
Retic Count, Absolute: 76.1 10*3/uL (ref 19.0–186.0)
Retic Ct Pct: 1.8 % (ref 0.4–3.1)

## 2018-11-21 LAB — FERRITIN: Ferritin: 19 ng/mL (ref 11–307)

## 2018-11-21 NOTE — Telephone Encounter (Signed)
Scheduled per los. Gave avs and calendar  

## 2018-12-05 ENCOUNTER — Telehealth: Payer: Self-pay

## 2018-12-05 NOTE — Telephone Encounter (Signed)
Called patient to do their pre-visit COVID screening.  Call went to voicemail. Unable to do prescreening.  

## 2018-12-09 ENCOUNTER — Ambulatory Visit: Payer: Medicaid Other | Admitting: Family Medicine

## 2019-01-09 ENCOUNTER — Encounter: Payer: Self-pay | Admitting: Family Medicine

## 2019-01-30 ENCOUNTER — Telehealth: Payer: Self-pay | Admitting: Family Medicine

## 2019-01-30 NOTE — Telephone Encounter (Signed)
Appointment had to be rescheduled due to changes in the provider schedule

## 2019-02-17 ENCOUNTER — Encounter: Payer: Medicaid Other | Admitting: Obstetrics & Gynecology

## 2019-02-20 NOTE — Progress Notes (Signed)
HEMATOLOGY/ONCOLOGY CONSULTATION NOTE  Date of Service: 02/20/2019  Patient Care Team: Scot Jun, FNP as PCP - General (Family Medicine) Delfino Lovett, PA-C as Consulting Physician (Dermatology)  CHIEF COMPLAINTS/PURPOSE OF CONSULTATION:  Thrombotic microangiopathy  HISTORY OF PRESENTING ILLNESS:   Sarah Washington is a wonderful 33 y.o. female who has been referred to [place] by Dr Mabeline Caras for evaluation and management of recently diagnosed acquired TTP/Thrombotic microangiopathy. She is accompanied today by her three children. The pt reports that she is doing well overall.   The pt reports that in January 2019 she began feeling weak, tired, and developed a newly heavy menstrual cycle. She also sites bruising easily that began occurring in January 2019 but denies nose bleeds, gum bleeds, and blood in the stools at the time. She notes that she first had labs to evaluate these symptoms She noted finger tip neuropathy, severe headache, abdominal pains, and tingling in her feet. She denies any infections at the time, PO contraceptives, hormone therapy, and new medications at the time. She also denies recent blood transfusions as well. She was discharged with Atovaquone (for PCP prophylaxis). 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  The pt presented to [facility] on 11/02/17 for TTP.  Her last plasma exchange was on 11/07/17. She received 5 total plasma exchanges. She received her third Rituxan infusion on 11/15/17, and only notes some mild itching but denies any rashes with her infusion. She has not yet received her fourth dose or Rituxan as she has moved from [facility] to [place]. She has been taking 30mg  Prednisone since 11/15/17.  She adds that she has gained 20 pounds of weight, noting that she has a very strong appetite while  taking steroids.    The pt has previously taken BP medication for her HTN.  She received 5 units of blood, and no plasma exchange after delivery of her third child was premature at 5 months due to HELLP syndrome.   Most recent lab results (11/15/17) of CBC  is as follows: all values are WNL except for WBC at 12.4k, RBC at 3.69, HGB at 11.8, RDW at 22.3, MPV at 6.9, PLT at 137k.  On review of systems, pt reports increased appetite, weight gain, normalized menstrual bleeding, and denies headaches, peripheral neuropathy, fevers, chills, night sweats, back pains, abdominal pains, changes in bowel habits, and any other symptoms.   On PMHx the pt reports HTN, tubal ligation in 2010, HELLP syndrome with third pregnancy requiring delivery after 5 months. Angioedema reaction to sulfa drugs.  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  On Family Hx the pt denies any blood, cancer, or autoimmune disorders.   Interval History:    Sarah Washington returns today regarding management and evaluation of her TTP. The patient's last visit with [place] was on 11/21/2018. The pt reports that she is doing well overall.  The pt reports no new health concerns and that she has an appt with [facility] w/ Dr. Rosana Hoes.  Lab results today (02/20/19) of CBC w/diff and CMP is as follows: all values are WNL except for Hemoglobin at 11.4, HCT at 35.9, Lymphs Abs at 4.2, AST at 14. -Pending Ferritin results  On review of systems, pt reports heavy periods and denies swelling in hands and any other symptoms.     MEDICAL HISTORY:  HTN, tubal  ligation in 2010, HELLP syndrome with third pregnancy requiring delivery after 5 months. Angioedema reaction to sulfa drugs.    SURGICAL HISTORY: Past Surgical History:  Procedure Laterality Date  . TUBAL LIGATION    tubal ligation in 2010  SOCIAL HISTORY: Social History   Socioeconomic History  . Marital status: Single    Spouse name: Not on file  . Number of  children: 3  . Years of education: Not on file  . Highest education level: Not on file  Occupational History  . Not on file  Social Needs  . Financial resource strain: Not on file  . Food insecurity    Worry: Not on file    Inability: Not on file  . Transportation needs    Medical: Not on file    Non-medical: Not on file  Tobacco Use  . Smoking status: Former Smoker    Types: Cigarettes    Quit date: 10/2017    Years since quitting: 1.3  . Smokeless tobacco: Never Used  Substance and Sexual Activity  . Alcohol use: Yes  . Drug use: Never  . Sexual activity: Yes  Lifestyle  . Physical activity    Days per week: Not on file    Minutes per session: Not on file  . Stress: Not on file  Relationships  . Social Herbalist on phone: Not on file    Gets together: Not on file    Attends religious service: Not on file    Active member of club or organization: Not on file    Attends meetings of clubs or organizations: Not on file    Relationship status: Not on file  . Intimate partner violence    Fear of current or ex partner: Not on file    Emotionally abused: Not on file    Physically abused: Not on file    Forced sexual activity: Not on file  Other Topics Concern  . Not on file  Social History Narrative  . Not on file    FAMILY HISTORY: Family History  Problem Relation Age of Onset  . Hypertension Mother   . Diabetes Mother   . Diabetes Father   . Hypertension Father   . Mental illness Father   . Arthritis Maternal Grandmother   . Cancer Maternal Grandmother   . Diabetes Maternal Grandmother   . Hypertension Maternal Grandmother   . Stroke Maternal Grandmother   . Stroke Maternal Grandfather   . Arthritis Paternal Grandmother   . Cancer Paternal Grandmother   . Diabetes Paternal Grandmother   . Hypertension Paternal Grandmother     ALLERGIES:  is allergic to sulfa antibiotics.  MEDICATIONS:  Current Outpatient Medications  Medication Sig  Dispense Refill  . clindamycin (CLEOCIN T) 1 % external solution APPLY 1 APPLICATION 2 TIMES A DAY TO SKIN    . doxycycline (VIBRAMYCIN) 100 MG capsule Take 1 capsule (100 mg total) by mouth daily. 90 capsule 1  . hydrochlorothiazide (HYDRODIURIL) 25 MG tablet Take 1 tablet (25 mg total) by mouth daily. 90 tablet 3  . iron polysaccharides (NIFEREX) 150 MG capsule Take 1 capsule (150 mg total) by mouth daily. 30 capsule 3  . mupirocin ointment (BACTROBAN) 2 % Apply 1 application topically 3 (three) times daily. 30 g 2   No current facility-administered medications for this visit.     REVIEW OF SYSTEMS:   A 10+ POINT REVIEW OF SYSTEMS WAS OBTAINED including neurology, dermatology, psychiatry, cardiac, respiratory, lymph, extremities, GI,  GU, Musculoskeletal, constitutional, breasts, reproductive, HEENT.  All pertinent positives are noted in the HPI.  All others are negative.    PHYSICAL EXAMINATION: ECOG FS:1 - Symptomatic but completely ambulatory  There were no vitals filed for this visit. Wt Readings from Last 3 Encounters:  11/21/18 237 lb 6.4 oz (107.7 kg)  08/21/18 241 lb 3.2 oz (109.4 kg)  06/26/18 224 lb 11.2 oz (101.9 kg)   There is no height or weight on file to calculate BMI.    GENERAL:alert, in no acute distress and comfortable SKIN: no acute rashes, no significant lesions EYES: conjunctiva are pink and non-injected, sclera anicteric OROPHARYNX: MMM, no exudates, no oropharyngeal erythema or ulceration NECK: supple, no JVD LYMPH:  no palpable lymphadenopathy in the cervical, axillary or inguinal regions LUNGS: clear to auscultation b/l with normal respiratory effort HEART: regular rate & rhythm ABDOMEN:  normoactive bowel sounds , non tender, not distended. Extremity: no pedal edema PSYCH: alert & oriented x 3 with fluent speech NEURO: no focal motor/sensory deficits   LABORATORY DATA:  I have reviewed the data as listed     11/15/17      . CBC Latest Ref  Rng & Units 02/21/2019 11/21/2018 08/21/2018  WBC 4.0 - 10.5 K/uL 10.1 8.1 8.3  Hemoglobin 12.0 - 15.0 g/dL 11.4(L) 11.7(L) 10.7(L)  Hematocrit 36.0 - 46.0 % 35.9(L) 36.5 33.4(L)  Platelets 150 - 400 K/uL 325 337 329    . CMP Latest Ref Rng & Units 02/21/2019 11/21/2018 08/21/2018  Glucose 70 - 99 mg/dL 93 102(H) 87  BUN 6 - 20 mg/dL 12 15 12   Creatinine 0.44 - 1.00 mg/dL 0.89 1.07(H) 0.84  Sodium 135 - 145 mmol/L 139 140 137  Potassium 3.5 - 5.1 mmol/L 3.9 4.2 3.6  Chloride 98 - 111 mmol/L 103 109 105  CO2 22 - 32 mmol/L 27 24 23   Calcium 8.9 - 10.3 mg/dL 9.1 8.6(L) 8.4(L)  Total Protein 6.5 - 8.1 g/dL 8.0 8.0 7.9  Total Bilirubin 0.3 - 1.2 mg/dL 0.3 <0.2(L) 0.3  Alkaline Phos 38 - 126 U/L 56 61 57  AST 15 - 41 U/L 14(L) 16 13(L)  ALT 0 - 44 U/L 12 16 16    . Lab Results  Component Value Date   IRON 33 (L) 06/26/2018   TIBC 346 06/26/2018   IRONPCTSAT 9 (L) 06/26/2018   (Iron and TIBC)  Lab Results  Component Value Date   FERRITIN 14 02/21/2019    Lab Results  Component Value Date   LDH 139 02/21/2019   Component     Latest Ref Rng & Units 11/26/2017  Adamts 13 Activity     >66.8 % 69.0  ADAMTS13 Activity comment      Comment    RADIOGRAPHIC STUDIES: I have personally reviewed the radiological images as listed and agreed with the findings in the report. No results found.  ASSESSMENT & PLAN:   33 y.o. female with hydradenitis suppurativa and   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  Severely low ADAMTS 13 levels of 6 on diagnosis. Now normalized to 69 ( ADAMTS13 from 11/26/17 ) S/p Plasmapheresis x 5. Completed Rituxan weekly x 4 doses.  2. RUE swelling -Right Upper Extremity Venous US from 12/10/17 showed no evidence of DVT Final Interpretation: Right: No evidence of deep vein thrombosis in the upper extremity. No evidence of superficial vein thrombosis in the upper extremity. Left: No evidence of thrombosis in the subclavian.  PLAN:  -Discussed pt labwork today,  02/20/19; -normal PLT and no evidence of hemolysis. No evidence of TTP relapse at this time. -Follow up in 3 months  FOLLOW UP: RTC with Dr Irene Limbo with labs in 3 months   The total time spent in the appt was 15 minutes and more than 50% was on counseling and direct patient cares.  All of the patient's questions were answered with apparent satisfaction. The patient knows to call the clinic with any problems, questions or concerns.   Sullivan Lone MD MS AAHIVMS [facility] Hematology/Oncology Physician [facility]  (Office):       [phone] (Work cell):  [phone] (Fax):           [phone]  02/20/2019 11:15 PM  I, Jacqualyn Posey, am acting as a Education administrator for Dr. Sullivan Lone.   .I have reviewed the above documentation for accuracy and completeness, and I agree with the above. Brunetta Genera MD

## 2019-02-21 ENCOUNTER — Inpatient Hospital Stay: Payer: Medicaid Other

## 2019-02-21 ENCOUNTER — Other Ambulatory Visit: Payer: Self-pay

## 2019-02-21 ENCOUNTER — Inpatient Hospital Stay: Payer: Medicaid Other | Attending: Hematology | Admitting: Hematology

## 2019-02-21 VITALS — BP 120/87 | HR 98 | Temp 98.3°F | Resp 18 | Ht 67.0 in | Wt 238.9 lb

## 2019-02-21 DIAGNOSIS — M311 Thrombotic microangiopathy: Secondary | ICD-10-CM

## 2019-02-21 DIAGNOSIS — D509 Iron deficiency anemia, unspecified: Secondary | ICD-10-CM | POA: Diagnosis not present

## 2019-02-21 DIAGNOSIS — Z87891 Personal history of nicotine dependence: Secondary | ICD-10-CM | POA: Insufficient documentation

## 2019-02-21 DIAGNOSIS — M3119 Other thrombotic microangiopathy: Secondary | ICD-10-CM

## 2019-02-21 DIAGNOSIS — D500 Iron deficiency anemia secondary to blood loss (chronic): Secondary | ICD-10-CM

## 2019-02-21 DIAGNOSIS — I10 Essential (primary) hypertension: Secondary | ICD-10-CM | POA: Diagnosis not present

## 2019-02-21 LAB — CMP (CANCER CENTER ONLY)
ALT: 12 U/L (ref 0–44)
AST: 14 U/L — ABNORMAL LOW (ref 15–41)
Albumin: 3.7 g/dL (ref 3.5–5.0)
Alkaline Phosphatase: 56 U/L (ref 38–126)
Anion gap: 9 (ref 5–15)
BUN: 12 mg/dL (ref 6–20)
CO2: 27 mmol/L (ref 22–32)
Calcium: 9.1 mg/dL (ref 8.9–10.3)
Chloride: 103 mmol/L (ref 98–111)
Creatinine: 0.89 mg/dL (ref 0.44–1.00)
GFR, Est AFR Am: >60 mL/min (ref >60)
GFR, Estimated: >60 mL/min (ref >60)
Glucose, Bld: 93 mg/dL (ref 70–99)
Potassium: 3.9 mmol/L (ref 3.5–5.1)
Sodium: 139 mmol/L (ref 135–145)
Total Bilirubin: 0.3 mg/dL (ref 0.3–1.2)
Total Protein: 8 g/dL (ref 6.5–8.1)

## 2019-02-21 LAB — CBC WITH DIFFERENTIAL/PLATELET
Abs Immature Granulocytes: 0.03 10*3/uL (ref 0.00–0.07)
Basophils Absolute: 0 10*3/uL (ref 0.0–0.1)
Basophils Relative: 0 %
Eosinophils Absolute: 0.5 10*3/uL (ref 0.0–0.5)
Eosinophils Relative: 5 %
HCT: 35.9 % — ABNORMAL LOW (ref 36.0–46.0)
Hemoglobin: 11.4 g/dL — ABNORMAL LOW (ref 12.0–15.0)
Immature Granulocytes: 0 %
Lymphocytes Relative: 42 %
Lymphs Abs: 4.2 10*3/uL — ABNORMAL HIGH (ref 0.7–4.0)
MCH: 26.9 pg (ref 26.0–34.0)
MCHC: 31.8 g/dL (ref 30.0–36.0)
MCV: 84.7 fL (ref 80.0–100.0)
Monocytes Absolute: 0.5 10*3/uL (ref 0.1–1.0)
Monocytes Relative: 5 %
Neutro Abs: 4.8 10*3/uL (ref 1.7–7.7)
Neutrophils Relative %: 48 %
Platelets: 325 10*3/uL (ref 150–400)
RBC: 4.24 MIL/uL (ref 3.87–5.11)
RDW: 14.1 % (ref 11.5–15.5)
WBC: 10.1 10*3/uL (ref 4.0–10.5)
nRBC: 0 % (ref 0.0–0.2)

## 2019-02-21 LAB — LACTATE DEHYDROGENASE: LDH: 139 U/L (ref 98–192)

## 2019-02-21 LAB — FERRITIN: Ferritin: 14 ng/mL (ref 11–307)

## 2019-02-24 ENCOUNTER — Telehealth: Payer: Self-pay | Admitting: Hematology

## 2019-02-24 NOTE — Telephone Encounter (Signed)
Scheduled appt per 9/18 los.  Left a voice message of appt date and time.

## 2019-03-06 ENCOUNTER — Encounter: Payer: Medicaid Other | Admitting: Obstetrics and Gynecology

## 2019-03-07 ENCOUNTER — Encounter: Payer: Medicaid Other | Admitting: Obstetrics and Gynecology

## 2019-05-26 ENCOUNTER — Other Ambulatory Visit: Payer: Self-pay | Admitting: Hematology

## 2019-05-26 ENCOUNTER — Inpatient Hospital Stay (HOSPITAL_BASED_OUTPATIENT_CLINIC_OR_DEPARTMENT_OTHER): Payer: Medicaid Other | Admitting: Hematology

## 2019-05-26 ENCOUNTER — Inpatient Hospital Stay: Payer: Medicaid Other | Attending: Hematology

## 2019-05-26 ENCOUNTER — Telehealth: Payer: Self-pay | Admitting: Hematology

## 2019-05-26 ENCOUNTER — Other Ambulatory Visit: Payer: Self-pay

## 2019-05-26 VITALS — BP 130/94 | HR 84 | Temp 98.4°F | Resp 17 | Ht 67.0 in | Wt 237.9 lb

## 2019-05-26 DIAGNOSIS — E611 Iron deficiency: Secondary | ICD-10-CM | POA: Insufficient documentation

## 2019-05-26 DIAGNOSIS — M311 Thrombotic microangiopathy: Secondary | ICD-10-CM

## 2019-05-26 DIAGNOSIS — M7989 Other specified soft tissue disorders: Secondary | ICD-10-CM | POA: Insufficient documentation

## 2019-05-26 DIAGNOSIS — D509 Iron deficiency anemia, unspecified: Secondary | ICD-10-CM

## 2019-05-26 DIAGNOSIS — M3119 Other thrombotic microangiopathy: Secondary | ICD-10-CM

## 2019-05-26 LAB — CBC WITH DIFFERENTIAL/PLATELET
Abs Immature Granulocytes: 0.03 10*3/uL (ref 0.00–0.07)
Basophils Absolute: 0 10*3/uL (ref 0.0–0.1)
Basophils Relative: 0 %
Eosinophils Absolute: 0.1 10*3/uL (ref 0.0–0.5)
Eosinophils Relative: 2 %
HCT: 35.9 % — ABNORMAL LOW (ref 36.0–46.0)
Hemoglobin: 11.7 g/dL — ABNORMAL LOW (ref 12.0–15.0)
Immature Granulocytes: 0 %
Lymphocytes Relative: 51 %
Lymphs Abs: 4.9 10*3/uL — ABNORMAL HIGH (ref 0.7–4.0)
MCH: 27.3 pg (ref 26.0–34.0)
MCHC: 32.6 g/dL (ref 30.0–36.0)
MCV: 83.9 fL (ref 80.0–100.0)
Monocytes Absolute: 0.5 10*3/uL (ref 0.1–1.0)
Monocytes Relative: 5 %
Neutro Abs: 4.1 10*3/uL (ref 1.7–7.7)
Neutrophils Relative %: 42 %
Platelets: 313 10*3/uL (ref 150–400)
RBC: 4.28 MIL/uL (ref 3.87–5.11)
RDW: 14.7 % (ref 11.5–15.5)
WBC: 9.6 10*3/uL (ref 4.0–10.5)
nRBC: 0 % (ref 0.0–0.2)

## 2019-05-26 LAB — CMP (CANCER CENTER ONLY)
ALT: 13 U/L (ref 0–44)
AST: 14 U/L — ABNORMAL LOW (ref 15–41)
Albumin: 3.4 g/dL — ABNORMAL LOW (ref 3.5–5.0)
Alkaline Phosphatase: 54 U/L (ref 38–126)
Anion gap: 10 (ref 5–15)
BUN: 15 mg/dL (ref 6–20)
CO2: 21 mmol/L — ABNORMAL LOW (ref 22–32)
Calcium: 8.7 mg/dL — ABNORMAL LOW (ref 8.9–10.3)
Chloride: 108 mmol/L (ref 98–111)
Creatinine: 0.88 mg/dL (ref 0.44–1.00)
GFR, Est AFR Am: >60 mL/min (ref >60)
GFR, Estimated: >60 mL/min (ref >60)
Glucose, Bld: 98 mg/dL (ref 70–99)
Potassium: 4.1 mmol/L (ref 3.5–5.1)
Sodium: 139 mmol/L (ref 135–145)
Total Bilirubin: <0.2 mg/dL — ABNORMAL LOW (ref 0.3–1.2)
Total Protein: 7.9 g/dL (ref 6.5–8.1)

## 2019-05-26 LAB — FERRITIN: Ferritin: 25 ng/mL (ref 11–307)

## 2019-05-26 MED ORDER — PANTOPRAZOLE SODIUM 40 MG PO TBEC: 40.0000 mg | DELAYED_RELEASE_TABLET | Freq: Every day | ORAL | 1 refills | Status: DC | PRN

## 2019-05-26 NOTE — Telephone Encounter (Signed)
Dr. Irene Limbo refilled medication

## 2019-05-26 NOTE — Telephone Encounter (Signed)
Scheduled appt per 12/21 los.  Sent a staff message to HIM to get a calendar mailed out.

## 2019-05-26 NOTE — Progress Notes (Signed)
HEMATOLOGY/ONCOLOGY CLINIC NOTE  Date of Service: 05/26/2019  Patient Care Team: Scot Jun, FNP as PCP - General (Family Medicine) Delfino Lovett, PA-C as Consulting Physician (Dermatology)  CHIEF COMPLAINTS/PURPOSE OF CONSULTATION:  F/u for Thrombotic microangiopathy  HISTORY OF PRESENTING ILLNESS:   Sarah Washington is a wonderful 33 y.o. female who has been referred to [place] by Dr Mabeline Caras for evaluation and management of recently diagnosed acquired TTP/Thrombotic microangiopathy. She is accompanied today by her three children. The pt reports that she is doing well overall.   The pt reports that in January 2019 she began feeling weak, tired, and developed a newly heavy menstrual cycle. She also sites bruising easily that began occurring in January 2019 but denies nose bleeds, gum bleeds, and blood in the stools at the time. She notes that she first had labs to evaluate these symptoms She noted finger tip neuropathy, severe headache, abdominal pains, and tingling in her feet. She denies any infections at the time, PO contraceptives, hormone therapy, and new medications at the time. She also denies recent blood transfusions as well. She was discharged with Atovaquone (for PCP prophylaxis). 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  The pt presented to [facility] on 11/02/17 for TTP.  Her last plasma exchange was on 11/07/17. She received 5 total plasma exchanges. She received her third Rituxan infusion on 11/15/17, and only notes some mild itching but denies any rashes with her infusion. She has not yet received her fourth dose or Rituxan as she has moved from [facility] to [place]. She has been taking 30mg  Prednisone since 11/15/17.  She adds that she has gained 20 pounds of weight, noting that she has a very strong appetite while  taking steroids.    The pt has previously taken BP medication for her HTN.  She received 5 units of blood, and no plasma exchange after delivery of her third child was premature at 5 months due to HELLP syndrome.   Most recent lab results (11/15/17) of CBC  is as follows: all values are WNL except for WBC at 12.4k, RBC at 3.69, HGB at 11.8, RDW at 22.3, MPV at 6.9, PLT at 137k.  On review of systems, pt reports increased appetite, weight gain, normalized menstrual bleeding, and denies headaches, peripheral neuropathy, fevers, chills, night sweats, back pains, abdominal pains, changes in bowel habits, and any other symptoms.   On PMHx the pt reports HTN, tubal ligation in 2010, HELLP syndrome with third pregnancy requiring delivery after 5 months. Angioedema reaction to sulfa drugs.  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  On Family Hx the pt denies any blood, cancer, or autoimmune disorders.   Interval History:    Sarah Washington returns today regarding management and evaluation of her TTP. The patient's last visit with [place] was on 02/21/2019. The pt reports that she is doing well overall.  The pt reports that she has been staying safe and doing well in the interim. She is having some issues with acid reflux and notes that the OTC acid suppressant that she takes makes her nauseous and want to vomit. Pt denies any bleeding concerns and notes that her periods have been regular. Pt is currently taking PO iron. She is interested in getting the Covid-19 vaccine due to pressure from her employer. She has continued to test negative  for the virus but she is currently working in a nursing home.   Lab results today (05/26/19) of CBC w/diff and CMP is as follows: all values are WNL except for Hgb at 11.7, HCT at 35.9, Lymphs Abs at 4.9K, CO2 at 21, Calcium at 8.7, Albumin at 3.4, AST at 14, Total Bilirubin at <0.2. 05/26/2019 Ferritin at 25  On review of systems, pt reports acid reflux and  denies bleeding concerns and any other symptoms.    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. Angioedema reaction to sulfa drugs.    SURGICAL HISTORY: Past Surgical History:  Procedure Laterality Date  . TUBAL LIGATION    tubal ligation in 2010  SOCIAL HISTORY: Social History   Socioeconomic History  . Marital status: Single    Spouse name: Not on file  . Number of children: 3  . Years of education: Not on file  . Highest education level: Not on file  Occupational History  . Not on file  Tobacco Use  . Smoking status: Former Smoker    Types: Cigarettes    Quit date: 10/2017    Years since quitting: 1.6  . Smokeless tobacco: Never Used  Substance and Sexual Activity  . Alcohol use: Yes  . Drug use: Never  . Sexual activity: Yes  Other Topics Concern  . Not on file  Social History Narrative  . Not on file   Social Determinants of Health   Financial Resource Strain:   . Difficulty of Paying Living Expenses: Not on file  Food Insecurity:   . Worried About Charity fundraiser in the Last Year: Not on file  . Ran Out of Food in the Last Year: Not on file  Transportation Needs:   . Lack of Transportation (Medical): Not on file  . Lack of Transportation (Non-Medical): Not on file  Physical Activity:   . Days of Exercise per Week: Not on file  . Minutes of Exercise per Session: Not on file  Stress:   . Feeling of Stress : Not on file  Social Connections:   . Frequency of Communication with Friends and Family: Not on file  . Frequency of Social Gatherings with Friends and Family: Not on file  . Attends Religious Services: Not on file  . Active Member of Clubs or Organizations: Not on file  . Attends Archivist Meetings: Not on file  . Marital Status: Not on file  Intimate Partner Violence:   . Fear of Current or Ex-Partner: Not on file  . Emotionally Abused: Not on file  .  Physically Abused: Not on file  . Sexually Abused: Not on file    FAMILY HISTORY: Family History  Problem Relation Age of Onset  . Hypertension Mother   . Diabetes Mother   . Diabetes Father   . Hypertension Father   . Mental illness Father   . Arthritis Maternal Grandmother   . Cancer Maternal Grandmother   . Diabetes Maternal Grandmother   . Hypertension Maternal Grandmother   . Stroke Maternal Grandmother   . Stroke Maternal Grandfather   . Arthritis Paternal Grandmother   . Cancer Paternal Grandmother   . Diabetes Paternal Grandmother   . Hypertension Paternal Grandmother     ALLERGIES:  is allergic to sulfa antibiotics.  MEDICATIONS:  Current Outpatient Medications  Medication Sig Dispense Refill  . clindamycin (CLEOCIN T) 1 % external solution APPLY 1 APPLICATION 2 TIMES  A DAY TO SKIN    . doxycycline (VIBRAMYCIN) 100 MG capsule Take 1 capsule (100 mg total) by mouth daily. 90 capsule 1  . hydrochlorothiazide (HYDRODIURIL) 25 MG tablet Take 1 tablet (25 mg total) by mouth daily. 90 tablet 3  . mupirocin ointment (BACTROBAN) 2 % Apply 1 application topically 3 (three) times daily. 30 g 2  . iron polysaccharides (NIFEREX) 150 MG capsule Take 1 capsule (150 mg total) by mouth daily. (Patient not taking: Reported on 05/26/2019) 30 capsule 3  . pantoprazole (PROTONIX) 40 MG tablet Take 1 tablet (40 mg total) by mouth daily as needed (GERD). 30 tablet 1   No current facility-administered medications for this visit.    REVIEW OF SYSTEMS:   A 10+ POINT REVIEW OF SYSTEMS WAS OBTAINED including neurology, dermatology, psychiatry, cardiac, respiratory, lymph, extremities, GI, GU, Musculoskeletal, constitutional, breasts, reproductive, HEENT.  All pertinent positives are noted in the HPI.  All others are negative.   PHYSICAL EXAMINATION: ECOG FS:1 - Symptomatic but completely ambulatory  Vitals:   05/26/19 1228  BP: (!) 130/94  Pulse: 84  Resp: 17  Temp: 98.4 F (36.9 C)    SpO2: 100%   Wt Readings from Last 3 Encounters:  05/26/19 237 lb 14.4 oz (107.9 kg)  02/21/19 238 lb 14.4 oz (108.4 kg)  11/21/18 237 lb 6.4 oz (107.7 kg)   Body mass index is 37.26 kg/m.    GENERAL:alert, in no acute distress and comfortable SKIN: no acute rashes, no significant lesions EYES: conjunctiva are pink and non-injected, sclera anicteric OROPHARYNX: MMM, no exudates, no oropharyngeal erythema or ulceration NECK: supple, no JVD LYMPH:  no palpable lymphadenopathy in the cervical, axillary or inguinal regions LUNGS: clear to auscultation b/l with normal respiratory effort HEART: regular rate & rhythm ABDOMEN:  normoactive bowel sounds , non tender, not distended. No palpable hepatosplenomegaly.  Extremity: no pedal edema PSYCH: alert & oriented x 3 with fluent speech NEURO: no focal motor/sensory deficits  LABORATORY DATA:  I have reviewed the data as listed     11/15/17      . CBC Latest Ref Rng & Units 05/26/2019 02/21/2019 11/21/2018  WBC 4.0 - 10.5 K/uL 9.6 10.1 8.1  Hemoglobin 12.0 - 15.0 g/dL 11.7(L) 11.4(L) 11.7(L)  Hematocrit 36.0 - 46.0 % 35.9(L) 35.9(L) 36.5  Platelets 150 - 400 K/uL 313 325 337    . CMP Latest Ref Rng & Units 05/26/2019 02/21/2019 11/21/2018  Glucose 70 - 99 mg/dL 98 93 102(H)  BUN 6 - 20 mg/dL 15 12 15   Creatinine 0.44 - 1.00 mg/dL 0.88 0.89 1.07(H)  Sodium 135 - 145 mmol/L 139 139 140  Potassium 3.5 - 5.1 mmol/L 4.1 3.9 4.2  Chloride 98 - 111 mmol/L 108 103 109  CO2 22 - 32 mmol/L 21(L) 27 24  Calcium 8.9 - 10.3 mg/dL 8.7(L) 9.1 8.6(L)  Total Protein 6.5 - 8.1 g/dL 7.9 8.0 8.0  Total Bilirubin 0.3 - 1.2 mg/dL <0.2(L) 0.3 <0.2(L)  Alkaline Phos 38 - 126 U/L 54 56 61  AST 15 - 41 U/L 14(L) 14(L) 16  ALT 0 - 44 U/L 13 12 16    . Lab Results  Component Value Date   IRON 33 (L) 06/26/2018   TIBC 346 06/26/2018   IRONPCTSAT 9 (L) 06/26/2018   (Iron and TIBC)  Lab Results  Component Value Date   FERRITIN 25 05/26/2019     Lab Results  Component Value Date   LDH 139 02/21/2019   Component  Latest Ref Rng & Units 11/26/2017  Adamts 13 Activity     >66.8 % 69.0  ADAMTS13 Activity comment      Comment    RADIOGRAPHIC STUDIES: I have personally reviewed the radiological images as listed and agreed with the findings in the report. No results found.  ASSESSMENT & PLAN:   33 y.o. female with hydradenitis suppurativa and   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  Severely low ADAMTS 13 levels of 6 on diagnosis. Now normalized to 69 ( ADAMTS13 from 11/26/17 ) S/p Plasmapheresis x 5. Completed Rituxan weekly x 4 doses.  2. RUE swelling -Right Upper Extremity Venous US from 12/10/17 showed no evidence of DVT Final Interpretation: Right: No evidence of deep vein thrombosis in the upper extremity. No evidence of superficial vein thrombosis in the upper extremity. Left: No evidence of thrombosis in the subclavian.  PLAN: -Discussed pt labwork today, 05/26/19; blood counts are stable, no thromobcytopenia, WBC are good, blood chemistries look good, mild iron deficiency -Continue PO Iron -No evidence of TTP relapse at this time.  -Due to a year of TTP stability will continue with f/u every 6 months -Rx Protonix -Will see back in 6 months with labs   FOLLOW UP: RTC with Dr Irene Limbo with labs in 6 months  The total time spent in the appt was 15  minutes and more than 50% was on counseling and direct patient cares.  All of the patient's questions were answered with apparent satisfaction. The patient knows to call the clinic with any problems, questions or concerns.  Sullivan Lone MD [place] AAHIVMS [facility] Hematology/Oncology Physician [facility]  (Office):       [phone] (Work cell):  [phone] (Fax):           [phone]  05/26/2019 12:56 PM  I, Yevette Edwards, am acting as a scribe for Dr. Sullivan Lone.   .I have reviewed the above documentation for  accuracy and completeness, and I agree with the above. Brunetta Genera MD

## 2019-05-27 ENCOUNTER — Ambulatory Visit (INDEPENDENT_AMBULATORY_CARE_PROVIDER_SITE_OTHER): Payer: Medicaid Other | Admitting: Physician Assistant

## 2019-05-27 DIAGNOSIS — L732 Hidradenitis suppurativa: Secondary | ICD-10-CM

## 2019-05-27 DIAGNOSIS — I10 Essential (primary) hypertension: Secondary | ICD-10-CM | POA: Diagnosis not present

## 2019-05-27 DIAGNOSIS — M311 Thrombotic microangiopathy: Secondary | ICD-10-CM | POA: Diagnosis not present

## 2019-05-27 DIAGNOSIS — M3119 Other thrombotic microangiopathy: Secondary | ICD-10-CM

## 2019-05-27 DIAGNOSIS — N920 Excessive and frequent menstruation with regular cycle: Secondary | ICD-10-CM | POA: Diagnosis not present

## 2019-05-27 MED ORDER — DOXYCYCLINE HYCLATE 100 MG PO CAPS: 100.0000 mg | ORAL_CAPSULE | Freq: Every day | ORAL | 1 refills | Status: DC

## 2019-05-27 MED ORDER — CLINDAMYCIN PHOSPHATE 1 % EX SOLN: CUTANEOUS | 1 refills | Status: DC

## 2019-05-27 MED ORDER — MUPIROCIN 2 % EX OINT: 1.0000 "application " | TOPICAL_OINTMENT | Freq: Three times a day (TID) | CUTANEOUS | 2 refills | Status: DC

## 2019-05-27 MED ORDER — HYDROCHLOROTHIAZIDE 25 MG PO TABS: 25.0000 mg | ORAL_TABLET | Freq: Every day | ORAL | 3 refills | Status: DC

## 2019-05-27 NOTE — Progress Notes (Signed)
Patient ID: Sarah Washington, female   DOB: 1985-07-29, 33 y.o.   MRN: [id number] Virtual Visit via Telephone Note  I connected with Sarah Washington on 05/27/19 at  8:30 AM EST by telephone and verified that I am speaking with the correct person using two identifiers.   I discussed the limitations, risks, security and privacy concerns of performing an evaluation and management service by telephone and the availability of in person appointments. I also discussed with the patient that there may be a patient responsible charge related to this service. The patient expressed understanding and agreed to proceed.  Patient location:  home My Location:  [place] office Persons on the call:  Me and the patient  History of Present Illness:  Patient needing RF on meds for boils and referral to gyn for heavy but regular periods.  Hematologist ordered labs yesterday but results not in at today's visit.  Known uterine fibroids and TTP.  Doing well otherwise.    Observations/Objective:  NAD.  A&Ox3   Assessment and Plan: 1. Menorrhagia with regular cycle - Ambulatory referral to Gynecology  2. TTP (thrombotic thrombocytopenic purpura) (HCC) No aspirin/NSAIDS  3. Hidradenitis suppurativa - clindamycin (CLEOCIN T) 1 % external solution; APPLY 1 APPLICATION 2 TIMES A DAY TO SKIN  Dispense: 60 mL; Refill: 1 - doxycycline (VIBRAMYCIN) 100 MG capsule; Take 1 capsule (100 mg total) by mouth daily.  Dispense: 90 capsule; Refill: 1 - mupirocin ointment (BACTROBAN) 2 %; Apply 1 application topically 3 (three) times daily.  Dispense: 30 g; Refill: 2  4. Essential hypertension stable - hydrochlorothiazide (HYDRODIURIL) 25 MG tablet; Take 1 tablet (25 mg total) by mouth daily.  Dispense: 90 tablet; Refill: 3    Follow Up Instructions: See PCP in 3 months   I discussed the assessment and treatment plan with the patient. The patient was provided an opportunity to ask questions and all were  answered. The patient agreed with the plan and demonstrated an understanding of the instructions.   The patient was advised to call back or seek an in-person evaluation if the symptoms worsen or if the condition fails to improve as anticipated.  I provided 8 minutes of non-face-to-face time during this encounter.   Freeman Caldron, PA-C

## 2019-06-27 ENCOUNTER — Encounter: Payer: Medicaid Other | Admitting: Obstetrics & Gynecology

## 2019-08-11 ENCOUNTER — Encounter: Payer: Self-pay | Admitting: Obstetrics and Gynecology

## 2019-08-11 ENCOUNTER — Other Ambulatory Visit (HOSPITAL_COMMUNITY)
Admission: RE | Admit: 2019-08-11 | Discharge: 2019-08-11 | Disposition: A | Discharge status: Home / Self Care | Payer: Medicaid Other | Source: Ambulatory Visit | Attending: Obstetrics and Gynecology | Admitting: Obstetrics and Gynecology

## 2019-08-11 ENCOUNTER — Ambulatory Visit (INDEPENDENT_AMBULATORY_CARE_PROVIDER_SITE_OTHER): Payer: Medicaid Other | Admitting: Obstetrics and Gynecology

## 2019-08-11 ENCOUNTER — Other Ambulatory Visit: Payer: Self-pay

## 2019-08-11 VITALS — BP 132/93 | HR 101 | Ht 67.0 in | Wt 236.3 lb

## 2019-08-11 DIAGNOSIS — Z01411 Encounter for gynecological examination (general) (routine) with abnormal findings: Secondary | ICD-10-CM | POA: Diagnosis present

## 2019-08-11 DIAGNOSIS — L732 Hidradenitis suppurativa: Secondary | ICD-10-CM | POA: Insufficient documentation

## 2019-08-11 DIAGNOSIS — Z0000 Encounter for general adult medical examination without abnormal findings: Secondary | ICD-10-CM

## 2019-08-11 DIAGNOSIS — N920 Excessive and frequent menstruation with regular cycle: Secondary | ICD-10-CM | POA: Insufficient documentation

## 2019-08-11 DIAGNOSIS — Z23 Encounter for immunization: Secondary | ICD-10-CM

## 2019-08-11 NOTE — Patient Instructions (Signed)
Menorrhagia Menorrhagia is when your menstrual periods are heavy or last longer than normal. Follow these instructions at home: Medicines   Take over-the-counter and prescription medicines exactly as told by your doctor. This includes iron pills.  Do not change or switch medicines without asking your doctor.  Do not take aspirin or medicines that contain aspirin 1 week before or during your period. Aspirin may make bleeding worse. General instructions  If you need to change your pad or tampon more than once every 2 hours, limit your activity until the bleeding stops.  Iron pills can cause problems when pooping (constipation). To prevent or treat pooping problems while taking prescription iron pills, your doctor may suggest that you: ? Drink enough fluid to keep your pee (urine) clear or pale yellow. ? Take over-the-counter or prescription medicines. ? Eat foods that are high in fiber. These foods include:  Fresh fruits and vegetables.  Whole grains.  Beans. ? Limit foods that are high in fat and processed sugars. This includes fried and sweet foods.  Eat healthy meals and foods that are high in iron. Foods that have a lot of iron include: ? Leafy green vegetables. ? Meat. ? Liver. ? Eggs. ? Whole grain breads and cereals.  Do not try to lose weight until your heavy bleeding has stopped and you have normal amounts of iron in your blood. If you need to lose weight, work with your doctor.  Keep all follow-up visits as told by your doctor. This is important. Contact a doctor if:  You soak through a pad or tampon every 1 or 2 hours, and this happens every time you have a period.  You need to use pads and tampons at the same time because you are bleeding so much.  You are taking medicine and you: ? Feel sick to your stomach (nauseous). ? Throw up (vomit). ? Have watery poop (diarrhea).  You have other problems that may be related to the medicine you are taking. Get help  right away if:  You soak through more than a pad or tampon in 1 hour.  You pass clots bigger than 1 inch (2.5 cm) wide.  You feel short of breath.  You feel like your heart is beating too fast.  You feel dizzy or you pass out (faint).  You feel very weak or tired. Summary  Menorrhagia is when your menstrual periods are heavy or last longer than normal.  Take over-the-counter and prescription medicines exactly as told by your doctor. This includes iron pills.  Contact a doctor if you soak through more than a pad or tampon in 1 hour or are passing large clots. This information is not intended to replace advice given to you by your health care provider. Make sure you discuss any questions you have with your health care provider. Document Revised: 08/29/2017 Document Reviewed: 06/12/2016 Elsevier Patient Education  2020 [place] Maintenance, Female Adopting a healthy lifestyle and getting preventive care are important in promoting health and wellness. Ask your health care provider about:  The right schedule for you to have regular tests and exams.  Things you can do on your own to prevent diseases and keep yourself healthy. What should I know about diet, weight, and exercise? Eat a healthy diet   Eat a diet that includes plenty of vegetables, fruits, low-fat dairy products, and lean protein.  Do not eat a lot of foods that are high in solid fats, added sugars, or sodium. Maintain a healthy  weight Body mass index (BMI) is used to identify weight problems. It estimates body fat based on height and weight. Your health care provider can help determine your BMI and help you achieve or maintain a healthy weight. Get regular exercise Get regular exercise. This is one of the most important things you can do for your health. Most adults should:  Exercise for at least 150 minutes each week. The exercise should increase your heart rate and make you sweat (moderate-intensity  exercise).  Do strengthening exercises at least twice a week. This is in addition to the moderate-intensity exercise.  Spend less time sitting. Even light physical activity can be beneficial. Watch cholesterol and blood lipids Have your blood tested for lipids and cholesterol at 36 years of age, then have this test every 5 years. Have your cholesterol levels checked more often if:  Your lipid or cholesterol levels are high.  You are older than 36 years of age.  You are at high risk for heart disease. What should I know about cancer screening? Depending on your health history and family history, you may need to have cancer screening at various ages. This may include screening for:  Breast cancer.  Cervical cancer.  Colorectal cancer.  Skin cancer.  Lung cancer. What should I know about heart disease, diabetes, and high blood pressure? Blood pressure and heart disease  High blood pressure causes heart disease and increases the risk of stroke. This is more likely to develop in people who have high blood pressure readings, are of African descent, or are overweight.  Have your blood pressure checked: ? Every 3-5 years if you are 22-68 years of age. ? Every year if you are 78 years old or older. Diabetes Have regular diabetes screenings. This checks your fasting blood sugar level. Have the screening done:  Once every three years after age 31 if you are at a normal weight and have a low risk for diabetes.  More often and at a younger age if you are overweight or have a high risk for diabetes. What should I know about preventing infection? Hepatitis B If you have a higher risk for hepatitis B, you should be screened for this virus. Talk with your health care provider to find out if you are at risk for hepatitis B infection. Hepatitis C Testing is recommended for:  Everyone born from [birth year] through 1967.  Anyone with known risk factors for hepatitis C. Sexually transmitted  infections (STIs)  Get screened for STIs, including gonorrhea and chlamydia, if: ? You are sexually active and are younger than 36 years of age. ? You are older than 36 years of age and your health care provider tells you that you are at risk for this type of infection. ? Your sexual activity has changed since you were last screened, and you are at increased risk for chlamydia or gonorrhea. Ask your health care provider if you are at risk.  Ask your health care provider about whether you are at high risk for HIV. Your health care provider may recommend a prescription medicine to help prevent HIV infection. If you choose to take medicine to prevent HIV, you should first get tested for HIV. You should then be tested every 3 months for as long as you are taking the medicine. Pregnancy  If you are about to stop having your period (premenopausal) and you may become pregnant, seek counseling before you get pregnant.  Take 400 to 800 micrograms (mcg) of folic acid every day  if you become pregnant.  Ask for birth control (contraception) if you want to prevent pregnancy. Osteoporosis and menopause Osteoporosis is a disease in which the bones lose minerals and strength with aging. This can result in bone fractures. If you are 26 years old or older, or if you are at risk for osteoporosis and fractures, ask your health care provider if you should:  Be screened for bone loss.  Take a calcium or vitamin D supplement to lower your risk of fractures.  Be given hormone replacement therapy (HRT) to treat symptoms of menopause. Follow these instructions at home: Lifestyle  Do not use any products that contain nicotine or tobacco, such as cigarettes, e-cigarettes, and chewing tobacco. If you need help quitting, ask your health care provider.  Do not use street drugs.  Do not share needles.  Ask your health care provider for help if you need support or information about quitting drugs. Alcohol use  Do  not drink alcohol if: ? Your health care provider tells you not to drink. ? You are pregnant, may be pregnant, or are planning to become pregnant.  If you drink alcohol: ? Limit how much you use to 0-1 drink a day. ? Limit intake if you are breastfeeding.  Be aware of how much alcohol is in your drink. In the [place] one drink equals one 12 oz bottle of beer (355 mL), one 5 oz glass of wine (148 mL), or one 1 oz glass of hard liquor (44 mL). General instructions  Schedule regular health, dental, and eye exams.  Stay current with your vaccines.  Tell your health care provider if: ? You often feel depressed. ? You have ever been abused or do not feel safe at home. Summary  Adopting a healthy lifestyle and getting preventive care are important in promoting health and wellness.  Follow your health care provider's instructions about healthy diet, exercising, and getting tested or screened for diseases.  Follow your health care provider's instructions on monitoring your cholesterol and blood pressure. This information is not intended to replace advice given to you by your health care provider. Make sure you discuss any questions you have with your health care provider. Document Revised: 05/15/2018 Document Reviewed: 05/15/2018 Elsevier Patient Education  2020 [employer].

## 2019-08-11 NOTE — Progress Notes (Signed)
Patient ID: Sarah Washington, female   DOB: 1986/02/19, 34 y.o.   MRN: [id number]  Sarah Washington is a 34 y.o. [phone] female here for a routine annual gynecologic exam.  Current complaints: heavy monthly cycles, for last 3-5 yrs. Cycles last 8-10 days, very heavy first 5 days, has to wear adult dipaers changes q 2hrs, able to switch to pads after .  She also has TTP which is stable and followed by Heme/Onc. H/O HTN on meds control has been fair. 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 She is due a pap smear and desires Tdap vccine   Gynecologic History Patient's last menstrual period was 08/04/2019 (approximate). Contraception: tubal ligation Last Pap: 2017. Results were: normal Last mammogram: NA.   Obstetric History OB History  Gravida Para Term Preterm AB Living  3 1   1   3   SAB TAB Ectopic Multiple Live Births          3    # Outcome Date GA Lbr Len/2nd Weight Sex Delivery Anes PTL Lv  3 Gravida           2 Gravida           1 Preterm            Largest 6 # 9 oz  Past Medical History:  Diagnosis Date  . Anxiety and depression   . Normocytic anemia   . TTP (thrombotic thrombocytopenic purpura) (HCC)     Past Surgical History:  Procedure Laterality Date  . TUBAL LIGATION      Current Outpatient Medications on File Prior to Visit  Medication Sig Dispense Refill  . cholecalciferol (VITAMIN D3) 25 MCG (1000 UNIT) tablet Take 1,000 Units by mouth daily.    . clindamycin (CLEOCIN T) 1 % external solution APPLY 1 APPLICATION 2 TIMES A DAY TO SKIN 60 mL 1  . doxycycline (VIBRAMYCIN) 100 MG capsule Take 1 capsule (100 mg total) by mouth daily. 90 capsule 1  . ferrous sulfate 325 (65 FE) MG tablet Take 325 mg by mouth daily with breakfast.    . hydrochlorothiazide (HYDRODIURIL) 25 MG tablet Take 1 tablet (25 mg total) by mouth daily. 90 tablet 3  . Multiple Vitamin (MULTIVITAMIN WITH MINERALS) TABS tablet Take 1 tablet by mouth daily.     . mupirocin ointment (BACTROBAN) 2 % Apply 1 application topically 3 (three) times daily. 30 g 2  . pantoprazole (PROTONIX) 40 MG tablet Take 1 tablet (40 mg total) by mouth daily as needed (GERD). 30 tablet 1  . vitamin B-12 (CYANOCOBALAMIN) 50 MCG tablet Take 50 mcg by mouth daily.    Marland Kitchen zinc gluconate 50 MG tablet Take 50 mg by mouth daily.     No current facility-administered medications on file prior to visit.    Allergies  Allergen Reactions  . Sulfa Antibiotics     Bactrim    Social History   Socioeconomic History  . Marital status: Single    Spouse name: Not on file  . Number of children: 3  . Years of education: Not on file  . Highest education level: Not on file  Occupational History  . Not on file  Tobacco Use  . Smoking status: Former Smoker    Types: Cigarettes    Quit date: 10/2017    Years since quitting: 1.8  . Smokeless tobacco: Never Used  Substance and Sexual Activity  . Alcohol use: Yes  . Drug use: Never  .  Sexual activity: Yes    Birth control/protection: Surgical  Other Topics Concern  . Not on file  Social History Narrative  . Not on file   Social Determinants of Health   Financial Resource Strain:   . Difficulty of Paying Living Expenses: Not on file  Food Insecurity:   . Worried About Charity fundraiser in the Last Year: Not on file  . Ran Out of Food in the Last Year: Not on file  Transportation Needs:   . Lack of Transportation (Medical): Not on file  . Lack of Transportation (Non-Medical): Not on file  Physical Activity:   . Days of Exercise per Week: Not on file  . Minutes of Exercise per Session: Not on file  Stress:   . Feeling of Stress : Not on file  Social Connections:   . Frequency of Communication with Friends and Family: Not on file  . Frequency of Social Gatherings with Friends and Family: Not on file  . Attends Religious Services: Not on file  . Active Member of Clubs or Organizations: Not on file  . Attends Theatre manager Meetings: Not on file  . Marital Status: Not on file  Intimate Partner Violence:   . Fear of Current or Ex-Partner: Not on file  . Emotionally Abused: Not on file  . Physically Abused: Not on file  . Sexually Abused: Not on file    Family History  Problem Relation Age of Onset  . Hypertension Mother   . Diabetes Mother   . Diabetes Father   . Hypertension Father   . Mental illness Father   . Arthritis Maternal Grandmother   . Cancer Maternal Grandmother   . Diabetes Maternal Grandmother   . Hypertension Maternal Grandmother   . Stroke Maternal Grandmother   . Stroke Maternal Grandfather   . Arthritis Paternal Grandmother   . Cancer Paternal Grandmother   . Diabetes Paternal Grandmother   . Hypertension Paternal Grandmother     The following portions of the patient's history were reviewed and updated as appropriate: allergies, current medications, past family history, past medical history, past social history, past surgical history and problem list.  Review of Systems Pertinent items noted in HPI and remainder of comprehensive ROS otherwise negative.   Objective:  BP (!) 132/93   Pulse (!) 101   Ht 5\' 7"  (1.702 m)   Wt 236 lb 4.8 oz (107.2 kg)   LMP 08/04/2019 (Approximate)   BMI 37.01 kg/m  CONSTITUTIONAL: Well-developed, well-nourished female in no acute distress.  HENT:  Normocephalic, atraumatic, External right and left ear normal. Oropharynx is clear and moist EYES: Conjunctivae and EOM are normal. Pupils are equal, round, and reactive to light. No scleral icterus.  NECK: Normal range of motion, supple, no masses.  Normal thyroid.  SKIN: Skin is warm and dry. No rash noted. Not diaphoretic. No erythema. No pallor. [place]: Alert and oriented to person, place, and time. Normal reflexes, muscle tone coordination. No cranial nerve deficit noted. PSYCHIATRIC: Normal mood and affect. Normal behavior. Normal judgment and thought content. CARDIOVASCULAR:  Normal heart rate noted, regular rhythm RESPIRATORY: Clear to auscultation bilaterally. Effort and breath sounds normal, no problems with respiration noted. BREASTS: Symmetric in size. No masses, skin changes, nipple drainage, or lymphadenopathy. Axillary, old HS scars and tracts noted ABDOMEN: Soft, normal bowel sounds, no distention noted.  No tenderness, rebound or guarding.  PELVIC: Normal appearing external genitalia except for old HS tracts and scars noted, purulent drainage noted  from one tract; normal appearing vaginal mucosa and cervix.  No abnormal discharge noted.  Pap smear obtained.  Normal uterine size, no other palpable masses, no uterine or adnexal tenderness. MUSCULOSKELETAL: Normal range of motion. No tenderness.  No cyanosis, clubbing, or edema.  2+ distal pulses.   Assessment:  Annual gynecologic examination with pap smear Menorrhagia with regular cycles HTN TTP HS  Plan:  Will follow up results of pap smear and manage accordingly. Will check labs and U/S today. Continue with antibiotic suppression and management as per PCP for HS. PCP for HTN  Routine preventative health maintenance measures emphasized. Please refer to After Visit Summary for other counseling recommendations.  F/U in 4 weeks to discuss U/S results and treatment options for menorrhagia  Chancy Milroy, MD, FACOG Attending [place] for [facility], [place]

## 2019-08-11 NOTE — Progress Notes (Signed)
Pt states with cycle has to wear pullups,pt states uses 7 to 8 a day during period.

## 2019-08-12 LAB — CBC
Hematocrit: 37.7 % (ref 34.0–46.6)
Hemoglobin: 11.7 g/dL (ref 11.1–15.9)
MCH: 26.1 pg — ABNORMAL LOW (ref 26.6–33.0)
MCHC: 31 g/dL — ABNORMAL LOW (ref 31.5–35.7)
MCV: 84 fL (ref 79–97)
Platelets: 403 10*3/uL (ref 150–450)
RBC: 4.49 x10E6/uL (ref 3.77–5.28)
RDW: 14.6 % (ref 11.7–15.4)
WBC: 9.4 10*3/uL (ref 3.4–10.8)

## 2019-08-12 LAB — TSH: TSH: 0.645 u[IU]/mL (ref 0.450–4.500)

## 2019-08-13 LAB — CYTOLOGY - PAP
Comment: NEGATIVE
High risk HPV: POSITIVE — AB

## 2019-08-14 ENCOUNTER — Encounter: Payer: Self-pay | Admitting: Obstetrics and Gynecology

## 2019-08-14 DIAGNOSIS — R87612 Low grade squamous intraepithelial lesion on cytologic smear of cervix (LGSIL): Secondary | ICD-10-CM | POA: Insufficient documentation

## 2019-08-15 ENCOUNTER — Ambulatory Visit (HOSPITAL_COMMUNITY)
Admission: RE | Admit: 2019-08-15 | Discharge: 2019-08-15 | Disposition: A | Discharge status: Home / Self Care | Payer: Medicaid Other | Source: Ambulatory Visit | Attending: Obstetrics and Gynecology | Admitting: Obstetrics and Gynecology

## 2019-08-15 ENCOUNTER — Other Ambulatory Visit: Payer: Self-pay

## 2019-08-15 DIAGNOSIS — N920 Excessive and frequent menstruation with regular cycle: Secondary | ICD-10-CM | POA: Diagnosis not present

## 2019-08-26 ENCOUNTER — Telehealth: Payer: Self-pay | Admitting: Hematology

## 2019-08-26 NOTE — Telephone Encounter (Signed)
R/s per MD sch change. Called and left a msg. Mailing printout

## 2019-08-27 ENCOUNTER — Telehealth: Payer: Self-pay

## 2019-08-27 NOTE — Patient Instructions (Addendum)
   Please take your blood pressure at home in the morning prior to drinking any caffeine. Sit with both feet flat on the floor. Rest for at least 2 minutes prior to checking your blood pressure to allow it to normalize after any movement. Write this number down on your log and please bring your log to every office visit. Call the clinic if you have numbers consistently greater than 150 on the top or 100 on the bottom.    Thank you for choosing Primary Care at [facility] to be your medical home!    Ree Shay was seen by Melina Schools, DO today.   Midge Minium [place]'s primary care provider is Phill Myron, DO.   For the best care possible, you should try to see Phill Myron, DO whenever you come to the clinic.   We look forward to seeing you again soon!  If you have any questions about your visit today, please call us at [phone] or feel free to reach your primary care provider via [place].

## 2019-08-27 NOTE — Telephone Encounter (Signed)
Called patient to do their pre-visit COVID screening.  Call went to voicemail. Unable to do prescreening.  

## 2019-08-28 ENCOUNTER — Ambulatory Visit (INDEPENDENT_AMBULATORY_CARE_PROVIDER_SITE_OTHER): Payer: Medicaid Other | Admitting: Internal Medicine

## 2019-08-28 ENCOUNTER — Other Ambulatory Visit: Payer: Self-pay

## 2019-08-28 ENCOUNTER — Encounter: Payer: Self-pay | Admitting: Internal Medicine

## 2019-08-28 VITALS — BP 120/82 | HR 60 | Temp 97.2°F | Resp 17 | Wt 235.0 lb

## 2019-08-28 DIAGNOSIS — I10 Essential (primary) hypertension: Secondary | ICD-10-CM | POA: Diagnosis not present

## 2019-08-28 DIAGNOSIS — R600 Localized edema: Secondary | ICD-10-CM

## 2019-08-28 NOTE — Progress Notes (Signed)
  Subjective:    Sarah Washington - 34 y.o. female MRN [id number]  Date of birth: 1986/03/17  HPI  Sarah Washington is here for HTN f/u.  Chronic HTN Disease Monitoring:  Home BP Monitoring - Does not monitor at home. Concerns about elevated diastolic at other office visit.  Chest pain- no  Dyspnea- no Headache - yes, chronic   Medications: HCTZ 25 mg daily  Compliance- yes Lightheadedness- no  Edema- no    Lower Extremity Edema: Reports feet and ankle edema, chronically. Bilaterally. Tries to wear compression stockings but finds them difficult to get on. Worsens with prolonged periods of standings. 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 Doesn't always wear supportive shoes especially when at hair salon. Attempts to restrict Na intake.     Health Maintenance:  There are no preventive care reminders to display for this patient.  -  reports that she quit smoking about 22 months ago. Her smoking use included cigarettes. She has never used smokeless tobacco. - Review of Systems: Per HPI. - Past Medical History: Patient Active Problem List   Diagnosis Date Noted  . LGSIL on Pap smear of cervix 08/14/2019  . Menorrhagia with regular cycle 08/11/2019  . Encntr for gyn exam (general) (routine) w abnormal findings 08/11/2019  . Hidradenitis suppurativa 08/11/2019  . TTP (thrombotic thrombocytopenic purpura) ([place]) 11/26/2017  . Counseling regarding advance care planning and goals of care 11/26/2017  . Anxiety and depression 11/01/2017   - Medications: reviewed and updated   Objective:   Physical Exam BP 120/82   Pulse 60   Temp (!) 97.2 F (36.2 C) (Temporal)   Resp 17   Wt 235 lb (106.6 kg)   LMP 08/04/2019 (Approximate)   SpO2 95%   BMI 36.81 kg/m  Physical Exam  Constitutional: She is oriented to person, place, and time and well-developed, well-nourished, and in no distress. No distress.  HENT:   Head: Normocephalic and atraumatic.  Eyes: Conjunctivae and EOM are normal.  Cardiovascular: Normal rate, regular rhythm and normal heart sounds.  No murmur heard. Trace non-pitting LE edema to the ankles. Negative Homan's sign.   Pulmonary/Chest: Effort normal and breath sounds normal. No respiratory distress.  Musculoskeletal:        General: Normal range of motion.  Neurological: She is alert and oriented to person, place, and time.  Skin: Skin is warm and dry. She is not diaphoretic.  Psychiatric: Affect and judgment normal.       Assessment & Plan:   1. Essential hypertension BP well within goal today. Did review that patient has had elevated diastolic pressures in the recent past. Encouraged to purchase cuff for at home monitoring. Given today's well controlled BP will not change regimen.   2. Lower extremity edema Suspect related to dependent edema given occurs with prolonged periods of standing and resolves with elevation. Edema is bilateral, chronic and patient has no signs of DVT. Lungs clear and does not have symptoms that would be concerning for CHF. Will check some basic labs to ensure no other etiologies of edema. Encouraged use of more supportive shoes. Could try larger compression stocking size as has difficulty getting her current socks on.  - CBC - Comprehensive metabolic panel - TSH    Phill Myron, D.O. 08/28/2019, 11:06 AM Primary Care at [facility]

## 2019-08-29 ENCOUNTER — Other Ambulatory Visit: Payer: Self-pay | Admitting: Internal Medicine

## 2019-08-29 DIAGNOSIS — R7989 Other specified abnormal findings of blood chemistry: Secondary | ICD-10-CM

## 2019-08-29 LAB — COMPREHENSIVE METABOLIC PANEL
ALT: 13 IU/L (ref 0–32)
AST: 12 IU/L (ref 0–40)
Albumin/Globulin Ratio: 1 — ABNORMAL LOW (ref 1.2–2.2)
Albumin: 4.1 g/dL (ref 3.8–4.8)
Alkaline Phosphatase: 69 IU/L (ref 39–117)
BUN/Creatinine Ratio: 11 (ref 9–23)
BUN: 11 mg/dL (ref 6–20)
Bilirubin Total: 0.2 mg/dL (ref 0.0–1.2)
CO2: 21 mmol/L (ref 20–29)
Calcium: 9.1 mg/dL (ref 8.7–10.2)
Chloride: 100 mmol/L (ref 96–106)
Creatinine, Ser: 0.98 mg/dL (ref 0.57–1.00)
GFR calc Af Amer: 86 mL/min/{1.73_m2} (ref >59)
GFR calc non Af Amer: 74 mL/min/{1.73_m2} (ref >59)
Globulin, Total: 4.3 g/dL (ref 1.5–4.5)
Glucose: 90 mg/dL (ref 65–99)
Potassium: 3.8 mmol/L (ref 3.5–5.2)
Sodium: 133 mmol/L — ABNORMAL LOW (ref 134–144)
Total Protein: 8.4 g/dL (ref 6.0–8.5)

## 2019-08-29 LAB — CBC
Hematocrit: 37.4 % (ref 34.0–46.6)
Hemoglobin: 12.2 g/dL (ref 11.1–15.9)
MCH: 27.7 pg (ref 26.6–33.0)
MCHC: 32.6 g/dL (ref 31.5–35.7)
MCV: 85 fL (ref 79–97)
Platelets: 367 10*3/uL (ref 150–450)
RBC: 4.41 x10E6/uL (ref 3.77–5.28)
RDW: 14.5 % (ref 11.7–15.4)
WBC: 10.3 10*3/uL (ref 3.4–10.8)

## 2019-08-29 LAB — TSH: TSH: 0.549 u[IU]/mL (ref 0.450–4.500)

## 2019-09-10 ENCOUNTER — Ambulatory Visit: Payer: Medicaid Other | Admitting: Obstetrics and Gynecology

## 2019-09-23 ENCOUNTER — Ambulatory Visit: Payer: Medicaid Other

## 2019-09-24 ENCOUNTER — Ambulatory Visit: Payer: Medicaid Other | Admitting: Internal Medicine

## 2019-10-13 ENCOUNTER — Other Ambulatory Visit: Payer: Self-pay

## 2019-10-13 ENCOUNTER — Other Ambulatory Visit (HOSPITAL_COMMUNITY)
Admission: RE | Admit: 2019-10-13 | Discharge: 2019-10-13 | Disposition: A | Discharge status: Home / Self Care | Payer: Medicaid Other | Source: Ambulatory Visit | Attending: Obstetrics and Gynecology | Admitting: Obstetrics and Gynecology

## 2019-10-13 ENCOUNTER — Encounter: Payer: Self-pay | Admitting: Obstetrics and Gynecology

## 2019-10-13 ENCOUNTER — Ambulatory Visit (INDEPENDENT_AMBULATORY_CARE_PROVIDER_SITE_OTHER): Payer: Medicaid Other | Admitting: Obstetrics and Gynecology

## 2019-10-13 VITALS — BP 138/93 | HR 113 | Ht 67.0 in | Wt 238.3 lb

## 2019-10-13 DIAGNOSIS — R87612 Low grade squamous intraepithelial lesion on cytologic smear of cervix (LGSIL): Secondary | ICD-10-CM | POA: Diagnosis not present

## 2019-10-13 DIAGNOSIS — N920 Excessive and frequent menstruation with regular cycle: Secondary | ICD-10-CM | POA: Diagnosis not present

## 2019-10-13 DIAGNOSIS — Z3202 Encounter for pregnancy test, result negative: Secondary | ICD-10-CM

## 2019-10-13 LAB — POCT PREGNANCY, URINE: Preg Test, Ur: NEGATIVE

## 2019-10-13 MED ORDER — TRANEXAMIC ACID 650 MG PO TABS: 1300.0000 mg | ORAL_TABLET | Freq: Three times a day (TID) | ORAL | 2 refills | Status: DC

## 2019-10-13 NOTE — Patient Instructions (Signed)
Colposcopy, Care After This sheet gives you information about how to care for yourself after your procedure. Your doctor may also give you more specific instructions. If you have problems or questions, contact your doctor. What can I expect after the procedure? If you did not have a tissue sample removed (did not have a biopsy), you may only have some spotting for a few days. You can go back to your normal activities. If you had a tissue sample removed, it is common to have:  Soreness and pain. This may last for a few days.  Light-headedness.  Mild bleeding from your vagina or dark-colored, grainy discharge from your vagina. This may last for a few days. You may need to wear a sanitary pad.  Spotting for at least 48 hours after the procedure. Follow these instructions at home:   Take over-the-counter and prescription medicines only as told by your doctor. Ask your doctor what medicines you can start taking again. This is very important if you take blood-thinning medicine.  Do not drive or use heavy machinery while taking prescription pain medicine.  For 3 days, or as long as your doctor tells you, avoid: ? Douching. ? Using tampons. ? Having sex.  If you use birth control (contraception), keep using it.  Limit activity for the first day after the procedure. Ask your doctor what activities are safe for you.  It is up to you to get the results of your procedure. Ask your doctor when your results will be ready.  Keep all follow-up visits as told by your doctor. This is important. Contact a doctor if:  You get a skin rash. Get help right away if:  You are bleeding a lot from your vagina. It is a lot of bleeding if you are using more than one pad an hour for 2 hours in a row.  You have clumps of blood (blood clots) coming from your vagina.  You have a fever.  You have chills  You have pain in your lower belly (pelvic area).  You have signs of infection, such as vaginal  discharge that is: ? Different than usual. ? Yellow. ? Bad-smelling.  You have very pain or cramps in your lower belly that do not get better with medicine.  You feel light-headed.  You feel dizzy.  You pass out (faint). Summary  If you did not have a tissue sample removed (did not have a biopsy), you may only have some spotting for a few days. You can go back to your normal activities.  If you had a tissue sample removed, it is common to have mild pain and spotting for 48 hours.  For 3 days, or as long as your doctor tells you, avoid douching, using tampons and having sex.  Get help right away if you have bleeding, very bad pain, or signs of infection. This information is not intended to replace advice given to you by your health care provider. Make sure you discuss any questions you have with your health care provider. Document Revised: 05/04/2017 Document Reviewed: 02/09/2016 Elsevier Patient Education  2020 Elsevier Inc.  

## 2019-10-13 NOTE — Progress Notes (Signed)
    GYNECOLOGY CLINIC COLPOSCOPY PROCEDURE NOTE  34 y.o. G3P0103 here for colposcopy for low-grade squamous intraepithelial neoplasia (LGSIL - encompassing HPV,mild dysplasia,CIN I) pap smear on 3/21. Discussed role for HPV in cervical dysplasia, need for surveillance.  Plus to discuss U/S results. U/S reviewed with pt.  Patient given informed consent, signed copy in the chart, time out was performed.  Placed in lithotomy position. Cervix viewed with speculum and colposcope after application of acetic acid.   Colposcopy adequate? Yes  acetowhite lesion(s) noted at 12 & 6 o'clock; corresponding biopsies obtained.  ECC specimen obtained. All specimens were labelled and sent to pathology.   Patient was given post procedure instructions.  Will follow up pathology and manage accordingly.  Routine preventative health maintenance measures emphasized.  Discussed Tx options for menorrhagia.  Will start Lystedia with next cycle. U/R/B reviewed  Arlina Robes, MD, [place] Attending [place] for [place]

## 2019-10-15 ENCOUNTER — Telehealth: Payer: Self-pay | Admitting: *Deleted

## 2019-10-15 LAB — SURGICAL PATHOLOGY

## 2019-10-15 NOTE — Telephone Encounter (Signed)
I called [facility] and left a message I am calling with some non-urgent information and about an appointment- since I did not reach you I will send you a detailed MyChart message. If you can not access your message or have questions- please call our office.   Will send message to registrar to schedule LEEP and contact patient. Tiwana Chavis,RN

## 2019-10-15 NOTE — Telephone Encounter (Signed)
-----   Message from Chancy Milroy, MD sent at 10/15/2019 10:02 AM EDT ----- Please schedule pt for LEEP with me. Thanks Legrand Como

## 2019-10-22 ENCOUNTER — Telehealth: Payer: Self-pay

## 2019-10-22 ENCOUNTER — Telehealth: Payer: Self-pay | Admitting: Internal Medicine

## 2019-10-22 NOTE — Telephone Encounter (Signed)
Returned patient call. Call was forwarded to voicemail. LM for patient to call the office at her earliest convenience.

## 2019-10-22 NOTE — Telephone Encounter (Signed)
Pt called for labs

## 2019-10-22 NOTE — Telephone Encounter (Signed)
Pt left VM on nurse line requesting a call back to discuss recent results and procedure.

## 2019-10-31 ENCOUNTER — Telehealth: Payer: Self-pay | Admitting: *Deleted

## 2019-10-31 NOTE — Telephone Encounter (Signed)
Sarah Washington called 10/30/19 afternoon and left a message she is supposed to have a procedure and she has questions. She states she keeps missing the call back from the nurse because she works 3rd shift and is sleeping on in school 9-2.  States can talk right now.  I called Sarah Washington back and she states with the previous procedure she was told she would be fine and was down for 2 days with cramping. I explained her provider recommended she have a LEEP procedure  because her coloposcopy showed moderate dysplacia and the provider will remove the area of dysplacia to prevent it form possibly advancing to cervial cancer. I explained she can take ibuprofen 600 mg at home before she comes in for the procedure and I recommended she ask the provider for a small prescription of ibuprofen 600 mg for after the procedure since she has issues after the colposcopy. I eplained the procedure. She voices understanding and thanked me for the call. Linda,RN

## 2019-11-05 ENCOUNTER — Telehealth: Payer: Self-pay | Admitting: Obstetrics and Gynecology

## 2019-11-05 NOTE — Telephone Encounter (Signed)
Attempted to contact patient to get her scheduled for a colpo w/ Dr.Ervin. No answer, left voicemail for patient to give the office a call back to be scheduled.

## 2019-11-24 ENCOUNTER — Inpatient Hospital Stay (HOSPITAL_BASED_OUTPATIENT_CLINIC_OR_DEPARTMENT_OTHER): Payer: Medicaid Other | Admitting: Hematology

## 2019-11-24 ENCOUNTER — Ambulatory Visit: Payer: Medicaid Other | Admitting: Hematology

## 2019-11-24 ENCOUNTER — Other Ambulatory Visit: Payer: Medicaid Other

## 2019-11-24 ENCOUNTER — Inpatient Hospital Stay: Payer: Medicaid Other | Attending: Hematology

## 2019-11-24 ENCOUNTER — Other Ambulatory Visit: Payer: Self-pay

## 2019-11-24 VITALS — BP 123/93 | HR 93 | Temp 97.9°F | Resp 18 | Ht 67.0 in | Wt 238.0 lb

## 2019-11-24 DIAGNOSIS — Z87891 Personal history of nicotine dependence: Secondary | ICD-10-CM | POA: Diagnosis not present

## 2019-11-24 DIAGNOSIS — M3119 Other thrombotic microangiopathy: Secondary | ICD-10-CM

## 2019-11-24 DIAGNOSIS — M311 Thrombotic microangiopathy: Secondary | ICD-10-CM | POA: Insufficient documentation

## 2019-11-24 DIAGNOSIS — D509 Iron deficiency anemia, unspecified: Secondary | ICD-10-CM | POA: Diagnosis not present

## 2019-11-24 LAB — CMP (CANCER CENTER ONLY)
ALT: 14 U/L (ref 0–44)
AST: 12 U/L — ABNORMAL LOW (ref 15–41)
Albumin: 3.1 g/dL — ABNORMAL LOW (ref 3.5–5.0)
Alkaline Phosphatase: 62 U/L (ref 38–126)
Anion gap: 7 (ref 5–15)
BUN: 13 mg/dL (ref 6–20)
CO2: 26 mmol/L (ref 22–32)
Calcium: 8.8 mg/dL — ABNORMAL LOW (ref 8.9–10.3)
Chloride: 107 mmol/L (ref 98–111)
Creatinine: 1 mg/dL (ref 0.44–1.00)
GFR, Est AFR Am: >60 mL/min (ref >60)
GFR, Estimated: >60 mL/min (ref >60)
Glucose, Bld: 91 mg/dL (ref 70–99)
Potassium: 4.1 mmol/L (ref 3.5–5.1)
Sodium: 140 mmol/L (ref 135–145)
Total Bilirubin: <0.2 mg/dL — ABNORMAL LOW (ref 0.3–1.2)
Total Protein: 7.5 g/dL (ref 6.5–8.1)

## 2019-11-24 LAB — CBC WITH DIFFERENTIAL/PLATELET
Abs Immature Granulocytes: 0.02 10*3/uL (ref 0.00–0.07)
Basophils Absolute: 0 10*3/uL (ref 0.0–0.1)
Basophils Relative: 0 %
Eosinophils Absolute: 0.2 10*3/uL (ref 0.0–0.5)
Eosinophils Relative: 2 %
HCT: 32.7 % — ABNORMAL LOW (ref 36.0–46.0)
Hemoglobin: 10.4 g/dL — ABNORMAL LOW (ref 12.0–15.0)
Immature Granulocytes: 0 %
Lymphocytes Relative: 49 %
Lymphs Abs: 4.6 10*3/uL — ABNORMAL HIGH (ref 0.7–4.0)
MCH: 25.6 pg — ABNORMAL LOW (ref 26.0–34.0)
MCHC: 31.8 g/dL (ref 30.0–36.0)
MCV: 80.5 fL (ref 80.0–100.0)
Monocytes Absolute: 0.6 10*3/uL (ref 0.1–1.0)
Monocytes Relative: 6 %
Neutro Abs: 4.1 10*3/uL (ref 1.7–7.7)
Neutrophils Relative %: 43 %
Platelets: 358 10*3/uL (ref 150–400)
RBC: 4.06 MIL/uL (ref 3.87–5.11)
RDW: 15.1 % (ref 11.5–15.5)
WBC: 9.5 10*3/uL (ref 4.0–10.5)
nRBC: 0 % (ref 0.0–0.2)

## 2019-11-24 LAB — FERRITIN: Ferritin: 11 ng/mL (ref 11–307)

## 2019-11-24 MED ORDER — POLYSACCHARIDE IRON COMPLEX 150 MG PO CAPS: 150.0000 mg | ORAL_CAPSULE | Freq: Two times a day (BID) | ORAL | 3 refills | Status: DC

## 2019-11-24 NOTE — Progress Notes (Signed)
HEMATOLOGY/ONCOLOGY CLINIC NOTE  Date of Service: 11/24/2019  Patient Care Team: Nicolette Bang, DO as PCP - General (Family Medicine) Delfino Lovett, PA-C as Consulting Physician (Dermatology)  CHIEF COMPLAINTS/PURPOSE OF CONSULTATION:  F/u for Thrombotic microangiopathy  HISTORY OF PRESENTING ILLNESS:   Sarah Washington is a wonderful 34 y.o. female who has been referred to [place] by Dr Mabeline Caras for evaluation and management of recently diagnosed acquired TTP/Thrombotic microangiopathy. She is accompanied today by her three children. The pt reports that she is doing well overall.   The pt reports that in January 2019 she began feeling weak, tired, and developed a newly heavy menstrual cycle. She also sites bruising easily that began occurring in January 2019 but denies nose bleeds, gum bleeds, and blood in the stools at the time. She notes that she first had labs to evaluate these symptoms She noted finger tip neuropathy, severe headache, abdominal pains, and tingling in her feet. She denies any infections at the time, PO contraceptives, hormone therapy, and new medications at the time. She also denies recent blood transfusions as well. She was discharged with Atovaquone (for PCP prophylaxis). 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  The pt presented to [facility] on 11/02/17 for TTP.  Her last plasma exchange was on 11/07/17. She received 5 total plasma exchanges. She received her third Rituxan infusion on 11/15/17, and only notes some mild itching but denies any rashes with her infusion. She has not yet received her fourth dose or Rituxan as she has moved from [facility] to [place]. She has been taking 30mg  Prednisone since 11/15/17.  She adds that she has gained 20 pounds of weight, noting that she has a very strong appetite  while taking steroids.    The pt has previously taken BP medication for her HTN.  She received 5 units of blood, and no plasma exchange after delivery of her third child was premature at 5 months due to HELLP syndrome.   Most recent lab results (11/15/17) of CBC  is as follows: all values are WNL except for WBC at 12.4k, RBC at 3.69, HGB at 11.8, RDW at 22.3, MPV at 6.9, PLT at 137k.  On review of systems, pt reports increased appetite, weight gain, normalized menstrual bleeding, and denies headaches, peripheral neuropathy, fevers, chills, night sweats, back pains, abdominal pains, changes in bowel habits, and any other symptoms.   On PMHx the pt reports HTN, tubal ligation in 2010, HELLP syndrome with third pregnancy requiring delivery after 5 months. Angioedema reaction to sulfa drugs.  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  On Family Hx the pt denies any blood, cancer, or autoimmune disorders.   Interval History:   Sarah Washington returns today regarding management and evaluation of her TTP. The patient's last visit with [place] was on 05/26/2019. The pt reports that she is doing well overall.  The pt reports that she has a LEEP procedure scheduled for 07/21 with Dr. Rip Harbour. Pt has been having 8-10 day menstrual cycles that are very heavy. She was given Lysteda to take three times a day to help reduce her menstrual flow. Pt did not notice significant improvement in the heaviness in of her menstrual cycle after starting Lysteda and has since discontinued the medication. She has been taking one Ferrous Sulfate per day.   Lab results today (  11/24/19) of CBC w/diff and CMP is as follows: all values are WNL except for Hgb at 10.4, HCT at 32.7, MCH at 25.6, Lymphs Abs at 4.6K, Calcium at 8.8, Albumin at 3.1, AST at 12, Total Bilirubin at <0.2. 11/24/2019 Ferritin at 11  On review of systems, pt reports heavy menstrual bleeding and denies gum bleeds, nose bleeds, bloody/black stools and  any other symptoms.    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. Angioedema reaction to sulfa drugs.    SURGICAL HISTORY: Past Surgical History:  Procedure Laterality Date  . TUBAL LIGATION    tubal ligation in 2010  SOCIAL HISTORY: Social History   Socioeconomic History  . Marital status: Single    Spouse name: Not on file  . Number of children: 3  . Years of education: Not on file  . Highest education level: Not on file  Occupational History  . Not on file  Tobacco Use  . Smoking status: Former Smoker    Types: Cigarettes    Quit date: 10/2017    Years since quitting: 2.1  . Smokeless tobacco: Never Used  Vaping Use  . Vaping Use: Never used  Substance and Sexual Activity  . Alcohol use: Yes  . Drug use: Never  . Sexual activity: Yes    Birth control/protection: Surgical  Other Topics Concern  . Not on file  Social History Narrative  . Not on file   Social Determinants of Health   Financial Resource Strain:   . Difficulty of Paying Living Expenses:   Food Insecurity:   . Worried About Charity fundraiser in the Last Year:   . Arboriculturist in the Last Year:   Transportation Needs:   . Film/video editor (Medical):   Marland Kitchen Lack of Transportation (Non-Medical):   Physical Activity:   . Days of Exercise per Week:   . Minutes of Exercise per Session:   Stress:   . Feeling of Stress :   Social Connections:   . Frequency of Communication with Friends and Family:   . Frequency of Social Gatherings with Friends and Family:   . Attends Religious Services:   . Active Member of Clubs or Organizations:   . Attends Archivist Meetings:   Marland Kitchen Marital Status:   Intimate Partner Violence:   . Fear of Current or Ex-Partner:   . Emotionally Abused:   Marland Kitchen Physically Abused:   . Sexually Abused:     FAMILY HISTORY: Family History  Problem Relation Age of Onset  . Hypertension  Mother   . Diabetes Mother   . Diabetes Father   . Hypertension Father   . Mental illness Father   . Arthritis Maternal Grandmother   . Cancer Maternal Grandmother   . Diabetes Maternal Grandmother   . Hypertension Maternal Grandmother   . Stroke Maternal Grandmother   . Stroke Maternal Grandfather   . Arthritis Paternal Grandmother   . Cancer Paternal Grandmother   . Diabetes Paternal Grandmother   . Hypertension Paternal Grandmother     ALLERGIES:  is allergic to sulfa antibiotics.  MEDICATIONS:  Current Outpatient Medications  Medication Sig Dispense Refill  . cholecalciferol (VITAMIN D3) 25 MCG (1000 UNIT) tablet Take 1,000 Units by mouth daily.    . clindamycin (CLEOCIN T) 1 % external solution APPLY 1 APPLICATION 2 TIMES A DAY TO SKIN 60 mL 1  . doxycycline (VIBRAMYCIN) 100 MG capsule Take  1 capsule (100 mg total) by mouth daily. 90 capsule 1  . hydrochlorothiazide (HYDRODIURIL) 25 MG tablet Take 1 tablet (25 mg total) by mouth daily. 90 tablet 3  . iron polysaccharides (NIFEREX) 150 MG capsule Take 1 capsule (150 mg total) by mouth 2 (two) times daily. 60 capsule 3  . Multiple Vitamin (MULTIVITAMIN WITH MINERALS) TABS tablet Take 1 tablet by mouth daily.    . mupirocin ointment (BACTROBAN) 2 % Apply 1 application topically 3 (three) times daily. 30 g 2  . pantoprazole (PROTONIX) 40 MG tablet Take 1 tablet (40 mg total) by mouth daily as needed (GERD). 30 tablet 1  . tranexamic acid (LYSTEDA) 650 MG TABS tablet Take 2 tablets (1,300 mg total) by mouth 3 (three) times daily. Take during menses for a maximum of five days 30 tablet 2  . vitamin B-12 (CYANOCOBALAMIN) 50 MCG tablet Take 50 mcg by mouth daily.    Marland Kitchen zinc gluconate 50 MG tablet Take 50 mg by mouth daily.     No current facility-administered medications for this visit.    REVIEW OF SYSTEMS:   A 10+ POINT REVIEW OF SYSTEMS WAS OBTAINED including neurology, dermatology, psychiatry, cardiac, respiratory, lymph,  extremities, GI, GU, Musculoskeletal, constitutional, breasts, reproductive, HEENT.  All pertinent positives are noted in the HPI.  All others are negative.   PHYSICAL EXAMINATION: ECOG FS:1 - Symptomatic but completely ambulatory  Vitals:   11/24/19 1419  BP: (!) 123/93  Pulse: 93  Resp: 18  Temp: 97.9 F (36.6 C)  SpO2: 100%   Wt Readings from Last 3 Encounters:  11/24/19 238 lb (108 kg)  10/13/19 238 lb 4.8 oz (108.1 kg)  08/28/19 235 lb (106.6 kg)   Body mass index is 37.28 kg/m.    GENERAL:alert, in no acute distress and comfortable SKIN: no acute rashes, no significant lesions EYES: conjunctiva are pink and non-injected, sclera anicteric OROPHARYNX: MMM, no exudates, no oropharyngeal erythema or ulceration NECK: supple, no JVD LYMPH:  no palpable lymphadenopathy in the cervical, axillary or inguinal regions LUNGS: clear to auscultation b/l with normal respiratory effort HEART: regular rate & rhythm ABDOMEN:  normoactive bowel sounds , non tender, not distended. No palpable hepatosplenomegaly.  Extremity: no pedal edema PSYCH: alert & oriented x 3 with fluent speech NEURO: no focal motor/sensory deficits  LABORATORY DATA:  I have reviewed the data as listed     11/15/17      . CBC Latest Ref Rng & Units 11/24/2019 08/28/2019 08/11/2019  WBC 4.0 - 10.5 K/uL 9.5 10.3 9.4  Hemoglobin 12.0 - 15.0 g/dL 10.4(L) 12.2 11.7  Hematocrit 36 - 46 % 32.7(L) 37.4 37.7  Platelets 150 - 400 K/uL 358 367 403    . CMP Latest Ref Rng & Units 11/24/2019 08/28/2019 05/26/2019  Glucose 70 - 99 mg/dL 91 90 98  BUN 6 - 20 mg/dL 13 11 15   Creatinine 0.44 - 1.00 mg/dL 1.00 0.98 0.88  Sodium 135 - 145 mmol/L 140 133(L) 139  Potassium 3.5 - 5.1 mmol/L 4.1 3.8 4.1  Chloride 98 - 111 mmol/L 107 100 108  CO2 22 - 32 mmol/L 26 21 21(L)  Calcium 8.9 - 10.3 mg/dL 8.8(L) 9.1 8.7(L)  Total Protein 6.5 - 8.1 g/dL 7.5 8.4 7.9  Total Bilirubin 0.3 - 1.2 mg/dL <0.2(L) 0.2 <0.2(L)  Alkaline  Phos 38 - 126 U/L 62 69 54  AST 15 - 41 U/L 12(L) 12 14(L)  ALT 0 - 44 U/L 14 13 13    .  Lab Results  Component Value Date   IRON 33 (L) 06/26/2018   TIBC 346 06/26/2018   IRONPCTSAT 9 (L) 06/26/2018   (Iron and TIBC)  Lab Results  Component Value Date   FERRITIN 11 11/24/2019    Lab Results  Component Value Date   LDH 139 02/21/2019   Component     Latest Ref Rng & Units 11/26/2017  Adamts 13 Activity     >66.8 % 69.0  ADAMTS13 Activity comment      Comment    RADIOGRAPHIC STUDIES: I have personally reviewed the radiological images as listed and agreed with the findings in the report. No results found.  ASSESSMENT & PLAN:   34 y.o. female with hydradenitis suppurativa and   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  Severely low ADAMTS 13 levels of 6 on diagnosis. Now normalized to 69 ( ADAMTS13 from 11/26/17 ) S/p Plasmapheresis x 5. Completed Rituxan weekly x 4 doses.  2. RUE swelling -Right Upper Extremity Venous US from 12/10/17 showed no evidence of DVT Final Interpretation: Right: No evidence of deep vein thrombosis in the upper extremity. No evidence of superficial vein thrombosis in the upper extremity. Left: No evidence of thrombosis in the subclavian.  PLAN: -Discussed pt labwork today, 11/24/19; mild anemia, WBC & PLT are nml, Calcium & Albumin are slightly low, other blood chemistries are steady Ferritin is low at 11  -Pt is still in remission from TTP. Will continue f/u every 6 months.  -Based on low MCH pt has IDA - PO Iron replacement is not holding -Recommend pt f/u with Dr. Rip Harbour for LEEP procedure and menorrhagia management  -Will give IV Injectafer weekly x2 beginning in 1 week -Rx 150 mg Iron Polysaccharide  -Will see back in 4 months with labs    FOLLOW UP: IV Injectafer weekly x 2 doses starting in 1 week RTC with Dr Irene Limbo with labs in 4 months   The total time spent in the appt was 20 minutes and more than 50% was  on counseling and direct patient cares.  All of the patient's questions were answered with apparent satisfaction. The patient knows to call the clinic with any problems, questions or concerns.   Sullivan Lone MD [place] AAHIVMS [facility] Hematology/Oncology Physician [facility]  (Office):       [phone] (Work cell):  [phone] (Fax):           [phone]  11/24/2019 4:56 PM  I, Yevette Edwards, am acting as a scribe for Dr. Sullivan Lone.   .I have reviewed the above documentation for accuracy and completeness, and I agree with the above. Brunetta Genera MD

## 2019-11-25 ENCOUNTER — Telehealth: Payer: Self-pay | Admitting: *Deleted

## 2019-11-25 NOTE — Telephone Encounter (Signed)
Contacted patient regarding test results per Dr. Grier Mitts directions in message attached. Patient is in agreement to receive IV iron. Message sent to scheduling to contact patient to arrange infusion appts.

## 2019-11-25 NOTE — Telephone Encounter (Signed)
-----   Message from Brunetta Genera, MD sent at 11/24/2019  4:43 PM EDT ----- Candida Peeling could you let Ms Whidden know her iron levels are quite low with her menorrhagia and worsening anemia. WOuld recommend IV Iron as discussed. IV Injectafer weekly x 2 doses. Orders are in will need scheduling msg. thx

## 2019-11-28 ENCOUNTER — Telehealth (INDEPENDENT_AMBULATORY_CARE_PROVIDER_SITE_OTHER): Payer: Medicaid Other | Admitting: Internal Medicine

## 2019-11-28 DIAGNOSIS — F17200 Nicotine dependence, unspecified, uncomplicated: Secondary | ICD-10-CM

## 2019-11-28 DIAGNOSIS — I10 Essential (primary) hypertension: Secondary | ICD-10-CM

## 2019-11-28 DIAGNOSIS — L732 Hidradenitis suppurativa: Secondary | ICD-10-CM | POA: Diagnosis not present

## 2019-11-28 MED ORDER — DOXYCYCLINE HYCLATE 100 MG PO CAPS: 100.0000 mg | ORAL_CAPSULE | Freq: Every day | ORAL | 1 refills | Status: DC

## 2019-11-28 MED ORDER — AMLODIPINE BESYLATE 2.5 MG PO TABS: 2.5000 mg | ORAL_TABLET | Freq: Every day | ORAL | 3 refills | Status: DC

## 2019-11-28 MED ORDER — CLINDAMYCIN PHOSPHATE 1 % EX SOLN: CUTANEOUS | 1 refills | Status: DC

## 2019-11-28 MED ORDER — HYDROCHLOROTHIAZIDE 25 MG PO TABS: 25.0000 mg | ORAL_TABLET | Freq: Every day | ORAL | 3 refills | Status: DC

## 2019-11-28 MED ORDER — MUPIROCIN 2 % EX OINT: 1.0000 "application " | TOPICAL_OINTMENT | Freq: Three times a day (TID) | CUTANEOUS | 2 refills | Status: DC

## 2019-11-28 NOTE — Progress Notes (Signed)
Virtual Visit via Telephone Note  I connected with Sarah Washington, on 11/28/2019 at 9:24 AM by telephone due to the COVID-19 pandemic and verified that I am speaking with the correct person using two identifiers.   Consent: I discussed the limitations, risks, security and privacy concerns of performing an evaluation and management service by telephone and the availability of in person appointments. I also discussed with the patient that there may be a patient responsible charge related to this service. The patient expressed understanding and agreed to proceed.   Location of Patient: Home   Location of Provider: Clinic    Persons participating in Telemedicine visit: Lorrine Killilea [facility] Dr. Juleen China      History of Present Illness: Patient has a visit to follow up on HTN.   Chronic HTN Disease Monitoring:  Home BP Monitoring - Does not have a cuff.  Chest pain- no  Dyspnea- no Headache - no  Medications: HCTZ 25 mg  Compliance- yes Lightheadedness- no  Edema- no    Past Medical History:  Diagnosis Date  . Anxiety and depression   . Normocytic anemia   . TTP (thrombotic thrombocytopenic purpura) (HCC)    Allergies  Allergen Reactions  . Sulfa Antibiotics     Bactrim    Current Outpatient Medications on File Prior to Visit  Medication Sig Dispense Refill  . cholecalciferol (VITAMIN D3) 25 MCG (1000 UNIT) tablet Take 1,000 Units by mouth daily.    . clindamycin (CLEOCIN T) 1 % external solution APPLY 1 APPLICATION 2 TIMES A DAY TO SKIN 60 mL 1  . doxycycline (VIBRAMYCIN) 100 MG capsule Take 1 capsule (100 mg total) by mouth daily. 90 capsule 1  . hydrochlorothiazide (HYDRODIURIL) 25 MG tablet Take 1 tablet (25 mg total) by mouth daily. 90 tablet 3  . iron polysaccharides (NIFEREX) 150 MG capsule Take 1 capsule (150 mg total) by mouth 2 (two) times daily. 60 capsule 3  . Multiple Vitamin (MULTIVITAMIN WITH MINERALS)  TABS tablet Take 1 tablet by mouth daily.    . mupirocin ointment (BACTROBAN) 2 % Apply 1 application topically 3 (three) times daily. 30 g 2  . pantoprazole (PROTONIX) 40 MG tablet Take 1 tablet (40 mg total) by mouth daily as needed (GERD). 30 tablet 1  . tranexamic acid (LYSTEDA) 650 MG TABS tablet Take 2 tablets (1,300 mg total) by mouth 3 (three) times daily. Take during menses for a maximum of five days 30 tablet 2  . vitamin B-12 (CYANOCOBALAMIN) 50 MCG tablet Take 50 mcg by mouth daily.    Marland Kitchen zinc gluconate 50 MG tablet Take 50 mg by mouth daily.     No current facility-administered medications on file prior to visit.    Observations/Objective: NAD. Speaking clearly.  Work of breathing normal.  Alert and oriented. Mood appropriate.   Assessment and Plan: 1. Essential hypertension Patient has concerns about diastolic pressures being elevated at outside offices. Reviewed that she is typically above 90. Will start Amlodipine 2.5 mg in attempt to optimize BP. Low dose due to systolic numbers historically being well controlled. Counseled on weight loss, tobacco cessation, low salt diet, exercise, etc to improve blood pressure control.  - amLODipine (NORVASC) 2.5 MG tablet; Take 1 tablet (2.5 mg total) by mouth daily.  Dispense: 90 tablet; Refill: 3 - hydrochlorothiazide (HYDRODIURIL) 25 MG tablet; Take 1 tablet (25 mg total) by mouth daily.  Dispense: 90 tablet; Refill: 3  2. Tobacco use disorder Counseled on potential benefits of  quitting smoking and risks associated with tobacco use.   3. Hidradenitis suppurativa - clindamycin (CLEOCIN T) 1 % external solution; APPLY 1 APPLICATION 2 TIMES A DAY TO SKIN  Dispense: 60 mL; Refill: 1 - doxycycline (VIBRAMYCIN) 100 MG capsule; Take 1 capsule (100 mg total) by mouth daily.  Dispense: 90 capsule; Refill: 1 - mupirocin ointment (BACTROBAN) 2 %; Apply 1 application topically 3 (three) times daily.  Dispense: 30 g; Refill: 2   Follow Up  Instructions: 3 month f/u for HTN or sooner if concerns    I discussed the assessment and treatment plan with the patient. The patient was provided an opportunity to ask questions and all were answered. The patient agreed with the plan and demonstrated an understanding of the instructions.   The patient was advised to call back or seek an in-person evaluation if the symptoms worsen or if the condition fails to improve as anticipated.     I provided 14 minutes total of non-face-to-face time during this encounter including median intraservice time, reviewing previous notes, investigations, ordering medications, medical decision making, coordinating care and patient verbalized understanding at the end of the visit.    Phill Myron, D.O. Primary Care at [facility]  11/28/2019, 9:24 AM

## 2019-12-01 ENCOUNTER — Telehealth: Payer: Self-pay | Admitting: Hematology

## 2019-12-01 ENCOUNTER — Inpatient Hospital Stay: Payer: Medicaid Other

## 2019-12-01 ENCOUNTER — Other Ambulatory Visit: Payer: Self-pay

## 2019-12-01 VITALS — BP 128/70 | HR 95 | Temp 98.4°F | Resp 18

## 2019-12-01 DIAGNOSIS — M311 Thrombotic microangiopathy: Secondary | ICD-10-CM | POA: Diagnosis not present

## 2019-12-01 DIAGNOSIS — D509 Iron deficiency anemia, unspecified: Secondary | ICD-10-CM

## 2019-12-01 MED ORDER — LORATADINE 10 MG PO TABS
10.0000 mg | ORAL_TABLET | Freq: Once | ORAL | Status: AC
  Administered 2019-12-01: 10 mg via ORAL

## 2019-12-01 MED ORDER — LORATADINE 10 MG PO TABS
ORAL_TABLET | ORAL | Status: AC
  Filled 2019-12-01: qty 1

## 2019-12-01 MED ORDER — ACETAMINOPHEN 325 MG PO TABS
650.0000 mg | ORAL_TABLET | Freq: Once | ORAL | Status: AC
  Administered 2019-12-01: 650 mg via ORAL

## 2019-12-01 MED ORDER — ACETAMINOPHEN 325 MG PO TABS
ORAL_TABLET | ORAL | Status: AC
  Filled 2019-12-01: qty 2

## 2019-12-01 MED ORDER — SODIUM CHLORIDE 0.9 % IV SOLN
Freq: Once | INTRAVENOUS | Status: AC
  Filled 2019-12-01: qty 250

## 2019-12-01 MED ORDER — SODIUM CHLORIDE 0.9 % IV SOLN
750.0000 mg | Freq: Once | INTRAVENOUS | Status: AC
  Administered 2019-12-01: 750 mg via INTRAVENOUS
  Filled 2019-12-01: qty 15

## 2019-12-01 NOTE — Telephone Encounter (Signed)
Scheduled per los, patient has been called and voicemail was left. 

## 2019-12-01 NOTE — Patient Instructions (Signed)

## 2019-12-05 ENCOUNTER — Other Ambulatory Visit: Payer: Self-pay | Admitting: Pharmacist

## 2019-12-09 ENCOUNTER — Inpatient Hospital Stay: Payer: Medicaid Other | Attending: Hematology

## 2019-12-09 ENCOUNTER — Other Ambulatory Visit: Payer: Self-pay

## 2019-12-09 VITALS — BP 125/83 | HR 101 | Temp 98.7°F | Resp 18

## 2019-12-09 DIAGNOSIS — D509 Iron deficiency anemia, unspecified: Secondary | ICD-10-CM | POA: Insufficient documentation

## 2019-12-09 MED ORDER — LORATADINE 10 MG PO TABS
ORAL_TABLET | ORAL | Status: AC
  Filled 2019-12-09: qty 1

## 2019-12-09 MED ORDER — ACETAMINOPHEN 325 MG PO TABS
650.0000 mg | ORAL_TABLET | Freq: Once | ORAL | Status: AC
  Administered 2019-12-09: 650 mg via ORAL

## 2019-12-09 MED ORDER — SODIUM CHLORIDE 0.9 % IV SOLN
750.0000 mg | Freq: Once | INTRAVENOUS | Status: AC
  Administered 2019-12-09: 750 mg via INTRAVENOUS
  Filled 2019-12-09: qty 15

## 2019-12-09 MED ORDER — SODIUM CHLORIDE 0.9 % IV SOLN
Freq: Once | INTRAVENOUS | Status: AC
  Filled 2019-12-09: qty 250

## 2019-12-09 MED ORDER — LORATADINE 10 MG PO TABS
10.0000 mg | ORAL_TABLET | Freq: Once | ORAL | Status: AC
  Administered 2019-12-09: 10 mg via ORAL

## 2019-12-09 MED ORDER — ACETAMINOPHEN 325 MG PO TABS
ORAL_TABLET | ORAL | Status: AC
  Filled 2019-12-09: qty 2

## 2019-12-09 NOTE — Patient Instructions (Signed)

## 2019-12-24 ENCOUNTER — Ambulatory Visit: Payer: Medicaid Other | Admitting: Obstetrics and Gynecology

## 2020-02-04 ENCOUNTER — Encounter: Payer: Self-pay | Admitting: Obstetrics and Gynecology

## 2020-02-04 ENCOUNTER — Ambulatory Visit (INDEPENDENT_AMBULATORY_CARE_PROVIDER_SITE_OTHER): Payer: Medicaid Other | Admitting: Obstetrics and Gynecology

## 2020-02-04 ENCOUNTER — Encounter: Payer: Self-pay | Admitting: Family Medicine

## 2020-02-04 ENCOUNTER — Other Ambulatory Visit: Payer: Self-pay

## 2020-02-04 ENCOUNTER — Other Ambulatory Visit (HOSPITAL_COMMUNITY)
Admission: RE | Admit: 2020-02-04 | Discharge: 2020-02-04 | Disposition: A | Discharge status: Home / Self Care | Payer: Medicaid Other | Source: Ambulatory Visit | Attending: Obstetrics and Gynecology | Admitting: Obstetrics and Gynecology

## 2020-02-04 VITALS — BP 129/98 | HR 104 | Ht 67.0 in | Wt 241.0 lb

## 2020-02-04 DIAGNOSIS — R87612 Low grade squamous intraepithelial lesion on cytologic smear of cervix (LGSIL): Secondary | ICD-10-CM | POA: Insufficient documentation

## 2020-02-04 DIAGNOSIS — Z3202 Encounter for pregnancy test, result negative: Secondary | ICD-10-CM | POA: Diagnosis not present

## 2020-02-04 DIAGNOSIS — N871 Moderate cervical dysplasia: Secondary | ICD-10-CM | POA: Diagnosis not present

## 2020-02-04 LAB — POCT PREGNANCY, URINE: Preg Test, Ur: NEGATIVE

## 2020-02-04 NOTE — Progress Notes (Signed)
° °  GYNECOLOGY CLINIC PROCEDURE NOTE  Sarah Washington is a 34 y.o. [phone] here for LEEP. No GYN concerns. Pap smear and colposcopy reviewed.    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  Role of HPV,cervical dysplasia and need for close followup was empasized. Informed written consent was obtained. All questions were answered. Time out performed.  Procedure: The patient was placed in lithotomy position and the bivalved coated speculum was placed in the patient's vagina. A grounding pad placed on the patient. Lugol's solution was applied to the cervix and areas of decreased uptake were noted around the transformation zone.   Local anesthesia was administered via an intracervical block using 10cc of 2% Lidocaine with epinephrine. The suction was turned on and the Large 1X Fisher Cone Biopsy Excisor on 70 Watts of cutting current was used to excise the area of decreased uptake and excise the entire transformation zone. Excellent hemostasis was achieved using roller ball coagulation set at 50 Watts coagulation current. Monsel's solution was then applied and the speculum was removed from the vagina. Specimens were sent to pathology.  The patient tolerated the procedure well. Post-operative instructions given to patient, including instruction to seek medical attention for persistent bright red bleeding, fever, abdominal/pelvic pain, dysuria, nausea or vomiting. She was also told about the possibility of having copious yellow to black tinged discharge for weeks. She was counseled to avoid anything in the vagina (sex/douching/tampons) for 3 weeks. She has a 4 week post-operative check to assess wound healing, review results and discuss further  management.   Arlina Robes, MD, [place] Attending [place] for [place]

## 2020-02-04 NOTE — Addendum Note (Signed)
Addended by: Chancy Milroy on: 02/04/2020 03:31 PM   Modules accepted: Level of Service

## 2020-02-04 NOTE — Patient Instructions (Signed)

## 2020-02-06 LAB — SURGICAL PATHOLOGY

## 2020-02-12 ENCOUNTER — Encounter: Payer: Self-pay | Admitting: General Practice

## 2020-02-20 ENCOUNTER — Telehealth: Payer: Self-pay | Admitting: Obstetrics and Gynecology

## 2020-02-20 NOTE — Telephone Encounter (Signed)
Returned patients call. She reports she has had some drainage from her vagina  She took Penicillin for a boil. She reports she has a white thin creamy liquid that is coming out. When she gets up in the morning it drips down her leg. No odor. No itching or burning. She has not had sex.   Reviewed with Dr. Rosana Hoes who reports most likely healing. Reviewed with patient that if she experiences pain, itching, burning, or odor to let the office know.   Patient voiced understanding.

## 2020-02-20 NOTE — Telephone Encounter (Signed)
Received a call from the patient requesting a call back form the nurse. She stated she has something running out of her ever since the Leep procedure.

## 2020-03-10 ENCOUNTER — Other Ambulatory Visit: Payer: Self-pay

## 2020-03-10 ENCOUNTER — Encounter: Payer: Self-pay | Admitting: Obstetrics and Gynecology

## 2020-03-10 ENCOUNTER — Ambulatory Visit (INDEPENDENT_AMBULATORY_CARE_PROVIDER_SITE_OTHER): Payer: Medicaid Other | Admitting: Obstetrics and Gynecology

## 2020-03-10 VITALS — BP 123/99 | HR 90 | Ht 67.0 in | Wt 243.3 lb

## 2020-03-10 DIAGNOSIS — R87612 Low grade squamous intraepithelial lesion on cytologic smear of cervix (LGSIL): Secondary | ICD-10-CM

## 2020-03-10 NOTE — Patient Instructions (Signed)
Health Maintenance, Female Adopting a healthy lifestyle and getting preventive care are important in promoting health and wellness. Ask your health care provider about:  The right schedule for you to have regular tests and exams.  Things you can do on your own to prevent diseases and keep yourself healthy. What should I know about diet, weight, and exercise? Eat a healthy diet   Eat a diet that includes plenty of vegetables, fruits, low-fat dairy products, and lean protein.  Do not eat a lot of foods that are high in solid fats, added sugars, or sodium. Maintain a healthy weight Body mass index (BMI) is used to identify weight problems. It estimates body fat based on height and weight. Your health care provider can help determine your BMI and help you achieve or maintain a healthy weight. Get regular exercise Get regular exercise. This is one of the most important things you can do for your health. Most adults should:  Exercise for at least 150 minutes each week. The exercise should increase your heart rate and make you sweat (moderate-intensity exercise).  Do strengthening exercises at least twice a week. This is in addition to the moderate-intensity exercise.  Spend less time sitting. Even light physical activity can be beneficial. Watch cholesterol and blood lipids Have your blood tested for lipids and cholesterol at 36 years of age, then have this test every 5 years. Have your cholesterol levels checked more often if:  Your lipid or cholesterol levels are high.  You are older than 36 years of age.  You are at high risk for heart disease. What should I know about cancer screening? Depending on your health history and family history, you may need to have cancer screening at various ages. This may include screening for:  Breast cancer.  Cervical cancer.  Colorectal cancer.  Skin cancer.  Lung cancer. What should I know about heart disease, diabetes, and high blood  pressure? Blood pressure and heart disease  High blood pressure causes heart disease and increases the risk of stroke. This is more likely to develop in people who have high blood pressure readings, are of African descent, or are overweight.  Have your blood pressure checked: ? Every 3-5 years if you are 18-39 years of age. ? Every year if you are 40 years old or older. Diabetes Have regular diabetes screenings. This checks your fasting blood sugar level. Have the screening done:  Once every three years after age 40 if you are at a normal weight and have a low risk for diabetes.  More often and at a younger age if you are overweight or have a high risk for diabetes. What should I know about preventing infection? Hepatitis B If you have a higher risk for hepatitis B, you should be screened for this virus. Talk with your health care provider to find out if you are at risk for hepatitis B infection. Hepatitis C Testing is recommended for:  Everyone born from 1945 through 1965.  Anyone with known risk factors for hepatitis C. Sexually transmitted infections (STIs)  Get screened for STIs, including gonorrhea and chlamydia, if: ? You are sexually active and are younger than 36 years of age. ? You are older than 36 years of age and your health care provider tells you that you are at risk for this type of infection. ? Your sexual activity has changed since you were last screened, and you are at increased risk for chlamydia or gonorrhea. Ask your health care provider if   you are at risk.  Ask your health care provider about whether you are at high risk for HIV. Your health care provider may recommend a prescription medicine to help prevent HIV infection. If you choose to take medicine to prevent HIV, you should first get tested for HIV. You should then be tested every 3 months for as long as you are taking the medicine. Pregnancy  If you are about to stop having your period (premenopausal) and  you may become pregnant, seek counseling before you get pregnant.  Take 400 to 800 micrograms (mcg) of folic acid every day if you become pregnant.  Ask for birth control (contraception) if you want to prevent pregnancy. Osteoporosis and menopause Osteoporosis is a disease in which the bones lose minerals and strength with aging. This can result in bone fractures. If you are 65 years old or older, or if you are at risk for osteoporosis and fractures, ask your health care provider if you should:  Be screened for bone loss.  Take a calcium or vitamin D supplement to lower your risk of fractures.  Be given hormone replacement therapy (HRT) to treat symptoms of menopause. Follow these instructions at home: Lifestyle  Do not use any products that contain nicotine or tobacco, such as cigarettes, e-cigarettes, and chewing tobacco. If you need help quitting, ask your health care provider.  Do not use street drugs.  Do not share needles.  Ask your health care provider for help if you need support or information about quitting drugs. Alcohol use  Do not drink alcohol if: ? Your health care provider tells you not to drink. ? You are pregnant, may be pregnant, or are planning to become pregnant.  If you drink alcohol: ? Limit how much you use to 0-1 drink a day. ? Limit intake if you are breastfeeding.  Be aware of how much alcohol is in your drink. In the [place] one drink equals one 12 oz bottle of beer (355 mL), one 5 oz glass of wine (148 mL), or one 1 oz glass of hard liquor (44 mL). General instructions  Schedule regular health, dental, and eye exams.  Stay current with your vaccines.  Tell your health care provider if: ? You often feel depressed. ? You have ever been abused or do not feel safe at home. Summary  Adopting a healthy lifestyle and getting preventive care are important in promoting health and wellness.  Follow your health care provider's instructions about healthy  diet, exercising, and getting tested or screened for diseases.  Follow your health care provider's instructions on monitoring your cholesterol and blood pressure. This information is not intended to replace advice given to you by your health care provider. Make sure you discuss any questions you have with your health care provider. Document Revised: 05/15/2018 Document Reviewed: 05/15/2018 Elsevier Patient Education  2020 Elsevier Inc.  

## 2020-03-10 NOTE — Progress Notes (Signed)
Ms Brandle presents for LEEP follow up She has no complaints today. Denies any bowel or bladder dysfunction No cycle since procedure Pathology reviewed with pt.  PE AF VSS Lungs clear Heart RRR Abd soft + BS GU nl EGBUS, LEEP site well healed  A/P LGSIL, S/P LEEP Return to normal ADL's. Repeat pap in 1 yr

## 2020-03-23 ENCOUNTER — Other Ambulatory Visit: Payer: Self-pay

## 2020-03-23 ENCOUNTER — Inpatient Hospital Stay (HOSPITAL_BASED_OUTPATIENT_CLINIC_OR_DEPARTMENT_OTHER): Payer: Medicaid Other | Admitting: Hematology

## 2020-03-23 ENCOUNTER — Inpatient Hospital Stay: Payer: Medicaid Other | Attending: Hematology

## 2020-03-23 VITALS — BP 134/85 | HR 92 | Temp 97.8°F | Resp 18 | Ht 67.0 in | Wt 224.6 lb

## 2020-03-23 DIAGNOSIS — M3119 Other thrombotic microangiopathy: Secondary | ICD-10-CM | POA: Diagnosis not present

## 2020-03-23 DIAGNOSIS — F17210 Nicotine dependence, cigarettes, uncomplicated: Secondary | ICD-10-CM | POA: Insufficient documentation

## 2020-03-23 DIAGNOSIS — D509 Iron deficiency anemia, unspecified: Secondary | ICD-10-CM

## 2020-03-23 LAB — CBC WITH DIFFERENTIAL/PLATELET
Abs Immature Granulocytes: 0.05 10*3/uL (ref 0.00–0.07)
Basophils Absolute: 0 10*3/uL (ref 0.0–0.1)
Basophils Relative: 0 %
Eosinophils Absolute: 0.3 10*3/uL (ref 0.0–0.5)
Eosinophils Relative: 2 %
HCT: 35.7 % — ABNORMAL LOW (ref 36.0–46.0)
Hemoglobin: 11.6 g/dL — ABNORMAL LOW (ref 12.0–15.0)
Immature Granulocytes: 1 %
Lymphocytes Relative: 46 %
Lymphs Abs: 5 10*3/uL — ABNORMAL HIGH (ref 0.7–4.0)
MCH: 29.1 pg (ref 26.0–34.0)
MCHC: 32.5 g/dL (ref 30.0–36.0)
MCV: 89.7 fL (ref 80.0–100.0)
Monocytes Absolute: 0.5 10*3/uL (ref 0.1–1.0)
Monocytes Relative: 5 %
Neutro Abs: 5 10*3/uL (ref 1.7–7.7)
Neutrophils Relative %: 46 %
Platelets: 314 10*3/uL (ref 150–400)
RBC: 3.98 MIL/uL (ref 3.87–5.11)
RDW: 13.8 % (ref 11.5–15.5)
WBC: 10.8 10*3/uL — ABNORMAL HIGH (ref 4.0–10.5)
nRBC: 0 % (ref 0.0–0.2)

## 2020-03-23 LAB — CMP (CANCER CENTER ONLY)
ALT: 12 U/L (ref 0–44)
AST: 12 U/L — ABNORMAL LOW (ref 15–41)
Albumin: 3.2 g/dL — ABNORMAL LOW (ref 3.5–5.0)
Alkaline Phosphatase: 59 U/L (ref 38–126)
Anion gap: 5 (ref 5–15)
BUN: 14 mg/dL (ref 6–20)
CO2: 26 mmol/L (ref 22–32)
Calcium: 9 mg/dL (ref 8.9–10.3)
Chloride: 106 mmol/L (ref 98–111)
Creatinine: 0.84 mg/dL (ref 0.44–1.00)
GFR, Estimated: >60 mL/min (ref >60)
Glucose, Bld: 112 mg/dL — ABNORMAL HIGH (ref 70–99)
Potassium: 3.9 mmol/L (ref 3.5–5.1)
Sodium: 137 mmol/L (ref 135–145)
Total Bilirubin: <0.2 mg/dL — ABNORMAL LOW (ref 0.3–1.2)
Total Protein: 7.6 g/dL (ref 6.5–8.1)

## 2020-03-23 LAB — IRON AND TIBC
Iron: 65 ug/dL (ref 41–142)
Saturation Ratios: 25 % (ref 21–57)
TIBC: 259 ug/dL (ref 236–444)
UIBC: 194 ug/dL (ref 120–384)

## 2020-03-23 LAB — FERRITIN: Ferritin: 202 ng/mL (ref 11–307)

## 2020-03-23 NOTE — Progress Notes (Signed)
HEMATOLOGY/ONCOLOGY CLINIC NOTE  Date of Service: 03/23/2020  Patient Care Team: Nicolette Bang, DO as PCP - General (Family Medicine) Delfino Lovett, PA-C as Consulting Physician (Dermatology)  CHIEF COMPLAINTS/PURPOSE OF CONSULTATION:  F/u for Thrombotic microangiopathy  HISTORY OF PRESENTING ILLNESS:   Sarah Washington is a wonderful 34 y.o. female who has been referred to [place] by Dr Mabeline Caras for evaluation and management of recently diagnosed acquired TTP/Thrombotic microangiopathy. She is accompanied today by her three children. The pt reports that she is doing well overall.   The pt reports that in January 2019 she began feeling weak, tired, and developed a newly heavy menstrual cycle. She also sites bruising easily that began occurring in January 2019 but denies nose bleeds, gum bleeds, and blood in the stools at the time. She notes that she first had labs to evaluate these symptoms She noted finger tip neuropathy, severe headache, abdominal pains, and tingling in her feet. She denies any infections at the time, PO contraceptives, hormone therapy, and new medications at the time. She also denies recent blood transfusions as well. She was discharged with Atovaquone (for PCP prophylaxis). 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  The pt presented to [facility] on 11/02/17 for TTP.  Her last plasma exchange was on 11/07/17. She received 5 total plasma exchanges. She received her third Rituxan infusion on 11/15/17, and only notes some mild itching but denies any rashes with her infusion. She has not yet received her fourth dose or Rituxan as she has moved from [facility] to [place]. She has been taking 30mg  Prednisone since 11/15/17.  She adds that she has gained 20 pounds of weight, noting that she has a very strong appetite  while taking steroids.    The pt has previously taken BP medication for her HTN.  She received 5 units of blood, and no plasma exchange after delivery of her third child was premature at 5 months due to HELLP syndrome.   Most recent lab results (11/15/17) of CBC  is as follows: all values are WNL except for WBC at 12.4k, RBC at 3.69, HGB at 11.8, RDW at 22.3, MPV at 6.9, PLT at 137k.  On review of systems, pt reports increased appetite, weight gain, normalized menstrual bleeding, and denies headaches, peripheral neuropathy, fevers, chills, night sweats, back pains, abdominal pains, changes in bowel habits, and any other symptoms.   On PMHx the pt reports HTN, tubal ligation in 2010, HELLP syndrome with third pregnancy requiring delivery after 5 months. Angioedema reaction to sulfa drugs.  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  On Family Hx the pt denies any blood, cancer, or autoimmune disorders.   Interval History:   [name] returns today regarding management and evaluation of her TTP. The patient's last visit with [place] was on 11/24/2019. The pt reports that she is doing well overall.  The pt reports that she has felt well over the last 4 months. Pt had her LEEP procedure and has noticed a reduction in her menstrual losses. Her last menstrual cycle lasted 4 days and was not particularly heavy. Dr. Rip Harbour gave her Marshell Levan that she is not currently taking. She has continued taking PO Iron. She has cut down her work scheduled and has noticed an increase in energy.   Lab results today (03/23/20) of CBC w/diff and  CMP is as follows: all values are WNL except for WBC at 10.8K, Hgb at 11.6, HCT at 35.7, Lymphs Abs at 5.0K, Glucose at 112, Albumin at 3.2, AST at 12, Total Bilirubin at <0.2. 03/23/2020 Iron Panel is as follows: Iron at 65, TIBC at 259, Sat Ratios at 25, UIBC at 194. 03/23/2020 Ferritin at 202  On review of systems, pt denies new fatigue, heavy menstrual cycles,  abdominal pain, constipation and any other symptoms.   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. Angioedema reaction to sulfa drugs.    SURGICAL HISTORY: Past Surgical History:  Procedure Laterality Date  . TUBAL LIGATION    tubal ligation in 2010  SOCIAL HISTORY: Social History   Socioeconomic History  . Marital status: Single    Spouse name: Not on file  . Number of children: 3  . Years of education: Not on file  . Highest education level: Not on file  Occupational History  . Not on file  Tobacco Use  . Smoking status: Current Some Day Smoker    Types: Cigarettes    Last attempt to quit: 10/2017    Years since quitting: 2.4  . Smokeless tobacco: Never Used  Vaping Use  . Vaping Use: Never used  Substance and Sexual Activity  . Alcohol use: Yes  . Drug use: Never  . Sexual activity: Yes    Birth control/protection: Surgical  Other Topics Concern  . Not on file  Social History Narrative  . Not on file   Social Determinants of Health   Financial Resource Strain:   . Difficulty of Paying Living Expenses: Not on file  Food Insecurity:   . Worried About Charity fundraiser in the Last Year: Not on file  . Ran Out of Food in the Last Year: Not on file  Transportation Needs:   . Lack of Transportation (Medical): Not on file  . Lack of Transportation (Non-Medical): Not on file  Physical Activity:   . Days of Exercise per Week: Not on file  . Minutes of Exercise per Session: Not on file  Stress:   . Feeling of Stress : Not on file  Social Connections:   . Frequency of Communication with Friends and Family: Not on file  . Frequency of Social Gatherings with Friends and Family: Not on file  . Attends Religious Services: Not on file  . Active Member of Clubs or Organizations: Not on file  . Attends Archivist Meetings: Not on file  . Marital Status: Not on file  Intimate Partner  Violence:   . Fear of Current or Ex-Partner: Not on file  . Emotionally Abused: Not on file  . Physically Abused: Not on file  . Sexually Abused: Not on file    FAMILY HISTORY: Family History  Problem Relation Age of Onset  . Hypertension Mother   . Diabetes Mother   . Diabetes Father   . Hypertension Father   . Mental illness Father   . Arthritis Maternal Grandmother   . Cancer Maternal Grandmother   . Diabetes Maternal Grandmother   . Hypertension Maternal Grandmother   . Stroke Maternal Grandmother   . Stroke Maternal Grandfather   . Arthritis Paternal Grandmother   . Cancer Paternal Grandmother   . Diabetes Paternal Grandmother   . Hypertension Paternal Grandmother     ALLERGIES:  is allergic to sulfa antibiotics.  MEDICATIONS:  Current Outpatient Medications  Medication  Sig Dispense Refill  . amLODipine (NORVASC) 2.5 MG tablet Take 1 tablet (2.5 mg total) by mouth daily. 90 tablet 3  . cholecalciferol (VITAMIN D3) 25 MCG (1000 UNIT) tablet Take 1,000 Units by mouth daily.    . clindamycin (CLEOCIN T) 1 % external solution APPLY 1 APPLICATION 2 TIMES A DAY TO SKIN 60 mL 1  . doxycycline (VIBRAMYCIN) 100 MG capsule Take 1 capsule (100 mg total) by mouth daily. 90 capsule 1  . hydrochlorothiazide (HYDRODIURIL) 25 MG tablet Take 1 tablet (25 mg total) by mouth daily. 90 tablet 3  . iron polysaccharides (NIFEREX) 150 MG capsule Take 1 capsule (150 mg total) by mouth 2 (two) times daily. 60 capsule 3  . Multiple Vitamin (MULTIVITAMIN WITH MINERALS) TABS tablet Take 1 tablet by mouth daily.    . mupirocin ointment (BACTROBAN) 2 % Apply 1 application topically 3 (three) times daily. 30 g 2  . pantoprazole (PROTONIX) 40 MG tablet Take 1 tablet (40 mg total) by mouth daily as needed (GERD). 30 tablet 1  . tranexamic acid (LYSTEDA) 650 MG TABS tablet Take 2 tablets (1,300 mg total) by mouth 3 (three) times daily. Take during menses for a maximum of five days 30 tablet 2  . vitamin  B-12 (CYANOCOBALAMIN) 50 MCG tablet Take 50 mcg by mouth daily.    Marland Kitchen zinc gluconate 50 MG tablet Take 50 mg by mouth daily.     No current facility-administered medications for this visit.    REVIEW OF SYSTEMS:   A 10+ POINT REVIEW OF SYSTEMS WAS OBTAINED including neurology, dermatology, psychiatry, cardiac, respiratory, lymph, extremities, GI, GU, Musculoskeletal, constitutional, breasts, reproductive, HEENT.  All pertinent positives are noted in the HPI.  All others are negative.   PHYSICAL EXAMINATION: ECOG FS:1 - Symptomatic but completely ambulatory  Vitals:   03/23/20 1436  BP: 134/85  Pulse: 92  Resp: 18  Temp: 97.8 F (36.6 C)  SpO2: 100%   Wt Readings from Last 3 Encounters:  03/23/20 224 lb 9.6 oz (101.9 kg)  03/10/20 243 lb 4.8 oz (110.4 kg)  02/04/20 241 lb (109.3 kg)   Body mass index is 35.18 kg/m.    GENERAL:alert, in no acute distress and comfortable SKIN: no acute rashes, no significant lesions EYES: conjunctiva are pink and non-injected, sclera anicteric OROPHARYNX: MMM, no exudates, no oropharyngeal erythema or ulceration NECK: supple, no JVD LYMPH:  no palpable lymphadenopathy in the cervical, axillary or inguinal regions LUNGS: clear to auscultation b/l with normal respiratory effort HEART: regular rate & rhythm ABDOMEN:  normoactive bowel sounds , non tender, not distended. No palpable hepatosplenomegaly.  Extremity: no pedal edema PSYCH: alert & oriented x 3 with fluent speech NEURO: no focal motor/sensory deficits  LABORATORY DATA:  I have reviewed the data as listed     11/15/17      . CBC Latest Ref Rng & Units 03/23/2020 11/24/2019 08/28/2019  WBC 4.0 - 10.5 K/uL 10.8(H) 9.5 10.3  Hemoglobin 12.0 - 15.0 g/dL 11.6(L) 10.4(L) 12.2  Hematocrit 36 - 46 % 35.7(L) 32.7(L) 37.4  Platelets 150 - 400 K/uL 314 358 367    . CMP Latest Ref Rng & Units 03/23/2020 11/24/2019 08/28/2019  Glucose 70 - 99 mg/dL 112(H) 91 90  BUN 6 - 20 mg/dL 14  13 11   Creatinine 0.44 - 1.00 mg/dL 0.84 1.00 0.98  Sodium 135 - 145 mmol/L 137 140 133(L)  Potassium 3.5 - 5.1 mmol/L 3.9 4.1 3.8  Chloride 98 - 111 mmol/L 106  107 100  CO2 22 - 32 mmol/L 26 26 21   Calcium 8.9 - 10.3 mg/dL 9.0 8.8(L) 9.1  Total Protein 6.5 - 8.1 g/dL 7.6 7.5 8.4  Total Bilirubin 0.3 - 1.2 mg/dL <0.2(L) <0.2(L) 0.2  Alkaline Phos 38 - 126 U/L 59 62 69  AST 15 - 41 U/L 12(L) 12(L) 12  ALT 0 - 44 U/L 12 14 13    . Lab Results  Component Value Date   IRON 65 03/23/2020   TIBC 259 03/23/2020   IRONPCTSAT 25 03/23/2020   (Iron and TIBC)  Lab Results  Component Value Date   FERRITIN 202 03/23/2020    Lab Results  Component Value Date   LDH 139 02/21/2019   Component     Latest Ref Rng & Units 11/26/2017  Adamts 13 Activity     >66.8 % 69.0  ADAMTS13 Activity comment      Comment    RADIOGRAPHIC STUDIES: I have personally reviewed the radiological images as listed and agreed with the findings in the report. No results found.  ASSESSMENT & PLAN:   34 y.o. female with hydradenitis suppurativa and   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  Severely low ADAMTS 13 levels of 6 on diagnosis. Now normalized to 69 ( ADAMTS13 from 11/26/17 ) S/p Plasmapheresis x 5. Completed Rituxan weekly x 4 doses.  2. RUE swelling -Right Upper Extremity Venous US from 12/10/17 showed no evidence of DVT Final Interpretation: Right: No evidence of deep vein thrombosis in the upper extremity. No evidence of superficial vein thrombosis in the upper extremity. Left: No evidence of thrombosis in the subclavian.  PLAN: -Discussed pt labwork today, 03/23/20; WBC are borderline elevated, Hgb has improved, PLT are nml, blood chemistries are stable, Sat Ratios are WNL, Ferritin looks good. -Pt is still in remission from TTP. Will continue watchful observation. -Continue 150 mg Iron Polysaccharide daily  -Will see back in 6 months with labs   FOLLOW UP: RTC  with Dr Irene Limbo with labs in 6 months   The total time spent in the appt was 20 minutes and more than 50% was on counseling and direct patient cares.  All of the patient's questions were answered with apparent satisfaction. The patient knows to call the clinic with any problems, questions or concerns.   Sullivan Lone MD [place] AAHIVMS [facility] Hematology/Oncology Physician [facility]  (Office):       [phone] (Work cell):  [phone] (Fax):           [phone]  03/23/2020 4:20 PM  I, Yevette Edwards, am acting as a scribe for Dr. Sullivan Lone.   .I have reviewed the above documentation for accuracy and completeness, and I agree with the above. Brunetta Genera MD

## 2020-05-02 ENCOUNTER — Encounter: Payer: Self-pay | Admitting: Hematology

## 2020-05-04 ENCOUNTER — Other Ambulatory Visit: Payer: Self-pay | Admitting: *Deleted

## 2020-05-04 ENCOUNTER — Encounter: Payer: Self-pay | Admitting: *Deleted

## 2020-05-04 DIAGNOSIS — D509 Iron deficiency anemia, unspecified: Secondary | ICD-10-CM

## 2020-05-04 DIAGNOSIS — M3119 Other thrombotic microangiopathy: Secondary | ICD-10-CM

## 2020-07-20 ENCOUNTER — Other Ambulatory Visit: Payer: Self-pay

## 2020-07-20 ENCOUNTER — Ambulatory Visit: Payer: Medicaid Other | Admitting: Physician Assistant

## 2020-07-20 VITALS — BP 133/97 | HR 98 | Temp 98.7°F | Resp 18 | Ht 67.0 in | Wt 246.0 lb

## 2020-07-20 DIAGNOSIS — R519 Headache, unspecified: Secondary | ICD-10-CM

## 2020-07-20 DIAGNOSIS — D508 Other iron deficiency anemias: Secondary | ICD-10-CM | POA: Diagnosis not present

## 2020-07-20 DIAGNOSIS — M3119 Other thrombotic microangiopathy: Secondary | ICD-10-CM | POA: Diagnosis not present

## 2020-07-20 DIAGNOSIS — N644 Mastodynia: Secondary | ICD-10-CM

## 2020-07-20 NOTE — Progress Notes (Signed)
Patient has taken vitamins an BP medication today. Patient complains of L breast pain beginning this past Saturday. Patient denies any discharge or bleeding form the nipple and denies any lumps from completing a self breast check at home. Patient shares while sleeping on the left side the breast feels "different" also. Patient reports intermittent HA's being consistent for the past few weeks. Patient has been taking tylenol 1000 mg in the morning to address the HA.

## 2020-07-20 NOTE — Patient Instructions (Signed)
For your breast pain, we have ordered an ultrasound to be completed.  For your headaches, I encourage you to increase your water intake, take your blood pressure medication on a daily basis, get your vision checked, make sure that you are getting restful sleep, at least 7 hours a night.    We will call you with your test results.  Kennieth Rad, PA-C Physician Assistant [facility] [email or web address]   Breast Tenderness Breast tenderness is a common problem for women of all ages, but may also occur in men. Breast tenderness may range from mild discomfort to severe pain. In women, the pain usually comes and goes with the menstrual cycle, but it can also be constant. Breast tenderness has many possible causes, including hormone changes, infections, and taking certain medicines. You may have tests, such as a mammogram or an ultrasound, to check for any unusual findings. Having breast tenderness usually does not mean that you have breast cancer. Follow these instructions at home: Managing pain and discomfort  If directed, put ice to the painful area. To do this: ? Put ice in a plastic bag. ? Place a towel between your skin and the bag. ? Leave the ice on for 20 minutes, 2-3 times a day.  Wear a supportive bra, especially during exercise. You may also want to wear a supportive bra while sleeping if your breasts are very tender.   Medicines  Take over-the-counter and prescription medicines only as told by your health care provider. If the cause of your pain is infection, you may be prescribed an antibiotic medicine.  If you were prescribed an antibiotic, take it as told by your health care provider. Do not stop taking the antibiotic even if you start to feel better. Eating and drinking  Your health care provider may recommend that you lessen the amount of fat in your diet. You can do this by: ? Limiting fried foods. ? Cooking foods  using methods such as baking, boiling, grilling, and broiling.  Decrease the amount of caffeine in your diet. Instead, drink more water and choose caffeine-free drinks. General instructions  Keep a log of the days and times when your breasts are most tender.  Ask your health care provider how to do breast exams at home. This will help you notice if you have an unusual growth or lump.  Keep all follow-up visits as told by your health care provider. This is important.   Contact a health care provider if:  Any part of your breast is hard, red, and hot to the touch. This may be a sign of infection.  You are a woman and: ? Not breastfeeding and you have fluid, especially blood or pus, coming out of your nipples. ? Have a new or painful lump in your breast that remains after your menstrual period ends.  You have a fever.  Your pain does not improve or it gets worse.  Your pain is interfering with your daily activities. Summary  Breast tenderness may range from mild discomfort to severe pain.  Breast tenderness has many possible causes, including hormone changes, infections, and taking certain medicines.  It can be treated with ice, wearing a supportive bra, and medicines.  Make changes to your diet if told to by your health care provider. This information is not intended to replace advice given to you by your health care provider. Make sure you discuss any questions you have with your health care provider. Document Revised: 10/14/2018 Document Reviewed: 10/14/2018  Elsevier Patient Education  2021 [place] Headache Without Cause A headache is pain or discomfort felt around the head or neck area. The specific cause of a headache may not be found. There are many causes and types of headaches. A few common ones are:  Tension headaches.  Migraine headaches.  Cluster headaches.  Chronic daily headaches. Follow these instructions at home: Watch your condition for  any changes. Let your health care provider know about them. Take these steps to help with your condition: Managing pain  Take over-the-counter and prescription medicines only as told by your health care provider.  Lie down in a dark, quiet room when you have a headache.  If directed, put ice on your head and neck area: ? Put ice in a plastic bag. ? Place a towel between your skin and the bag. ? Leave the ice on for 20 minutes, 2-3 times per day.  If directed, apply heat to the affected area. Use the heat source that your health care provider recommends, such as a moist heat pack or a heating pad. ? Place a towel between your skin and the heat source. ? Leave the heat on for 20-30 minutes. ? Remove the heat if your skin turns bright red. This is especially important if you are unable to feel pain, heat, or cold. You may have a greater risk of getting burned.  Keep lights dim if bright lights bother you or make your headaches worse.      Eating and drinking  Eat meals on a regular schedule.  If you drink alcohol: ? Limit how much you use to:  0-1 drink a day for women.  0-2 drinks a day for men. ? Be aware of how much alcohol is in your drink. In the [place] one drink equals one 12 oz bottle of beer (355 mL), one 5 oz glass of wine (148 mL), or one 1 oz glass of hard liquor (44 mL).  Stop drinking caffeine, or decrease the amount of caffeine you drink. General instructions  Keep a headache journal to help find out what may trigger your headaches. For example, write down: ? What you eat and drink. ? How much sleep you get. ? Any change to your diet or medicines.  Try massage or other relaxation techniques.  Limit stress.  Sit up straight, and do not tense your muscles.  Do not use any products that contain nicotine or tobacco, such as cigarettes, e-cigarettes, and chewing tobacco. If you need help quitting, ask your health care provider.  Exercise regularly as told by your  health care provider.  Sleep on a regular schedule. Get 7-9 hours of sleep each night, or the amount recommended by your health care provider.  Keep all follow-up visits as told by your health care provider. This is important.   Contact a health care provider if:  Your symptoms are not helped by medicine.  You have a headache that is different from the usual headache.  You have nausea or you vomit.  You have a fever. Get help right away if:  Your headache becomes severe quickly.  Your headache gets worse after moderate to intense physical activity.  You have repeated vomiting.  You have a stiff neck.  You have a loss of vision.  You have problems with speech.  You have pain in the eye or ear.  You have muscular weakness or loss of muscle control.  You lose your balance or have trouble walking.  You feel faint or pass out.  You have confusion.  You have a seizure. Summary  A headache is pain or discomfort felt around the head or neck area.  There are many causes and types of headaches. In some cases, the cause may not be found.  Keep a headache journal to help find out what may trigger your headaches. Watch your condition for any changes. Let your health care provider know about them.  Contact a health care provider if you have a headache that is different from the usual headache, or if your symptoms are not helped by medicine.  Get help right away if your headache becomes severe, you vomit, you have a loss of vision, you lose your balance, or you have a seizure. This information is not intended to replace advice given to you by your health care provider. Make sure you discuss any questions you have with your health care provider. Document Revised: 12/10/2017 Document Reviewed: 12/10/2017 Elsevier Patient Education  [place].

## 2020-07-20 NOTE — Progress Notes (Unsigned)
Established Patient Office Visit  Subjective:  Patient ID: Sarah Washington, female    DOB: 1985/11/28  Age: 35 y.o. MRN: [id number]  CC: No chief complaint on file.   HPI Rona Tomson reports that she has been having pain in her left breast since Saturday July 17, 2020, describes it as pain near her nipple shooting upwards.  Reports that she just finished her menses, does not feel any lumps, is wearing a supportive bra, has not tried anything for relief.  Denies nipple discharge, injury,trauma.   Reports that she has been having headaches on a daily basis for the last few weeks, states that she noticed she was taking Tylenol every day.  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  States that she is being treated for TTP, has a follow-up in April with her specialist, states that she has previously had flareups that began with headaches.  Does endorse that she is taking her iron every day   Past Medical History:  Diagnosis Date  . Anxiety and depression   . Normocytic anemia   . TTP (thrombotic thrombocytopenic purpura)     Past Surgical History:  Procedure Laterality Date  . TUBAL LIGATION      Family History  Problem Relation Age of Onset  . Hypertension Mother   . Diabetes Mother   . Diabetes Father   . Hypertension Father   . Mental illness Father   . Arthritis Maternal Grandmother   . Cancer Maternal Grandmother   . Diabetes Maternal Grandmother   . Hypertension Maternal Grandmother   . Stroke Maternal Grandmother   . Stroke Maternal Grandfather   . Arthritis Paternal Grandmother   . Cancer Paternal Grandmother   . Diabetes Paternal Grandmother   . Hypertension Paternal Grandmother     Social History    Socioeconomic History  . Marital status: Single    Spouse name: Not on file  . Number of children: 3  . Years of education: Not on file  . Highest education level: Not on file  Occupational History  . Not on file  Tobacco Use  . Smoking status: Current Some Day Smoker    Types: Cigarettes    Last attempt to quit: 10/2017    Years since quitting: 2.7  . Smokeless tobacco: Never Used  Vaping Use  . Vaping Use: Never used  Substance and Sexual Activity  . Alcohol use: Yes  . Drug use: Never  . Sexual activity: Yes    Birth control/protection: Surgical  Other Topics Concern  . Not on file  Social History Narrative  . Not on file   Social Determinants of Health   Financial Resource Strain: Not on file  Food Insecurity: Not on file  Transportation Needs: Not on file  Physical Activity: Not on file  Stress: Not on file  Social Connections: Not on file  Intimate Partner Violence: Not on file    Outpatient Medications Prior to Visit  Medication Sig Dispense Refill  . amLODipine (NORVASC) 2.5 MG tablet Take 1 tablet (2.5 mg total) by mouth daily. 90 tablet 3  . cholecalciferol (VITAMIN D3) 25 MCG (1000 UNIT) tablet Take 1,000 Units by mouth daily.    . clindamycin (CLEOCIN T) 1 % external  solution APPLY 1 APPLICATION 2 TIMES A DAY TO SKIN 60 mL 1  . doxycycline (VIBRAMYCIN) 100 MG capsule Take 1 capsule (100 mg total) by mouth daily. 90 capsule 1  . hydrochlorothiazide (HYDRODIURIL) 25 MG tablet Take 1 tablet (25 mg total) by mouth daily. 90 tablet 3  . iron polysaccharides (NIFEREX) 150 MG capsule Take 1 capsule (150 mg total) by mouth 2 (two) times daily. 60 capsule 3  . Multiple Vitamin (MULTIVITAMIN WITH MINERALS) TABS tablet Take 1 tablet by mouth daily.    . mupirocin ointment (BACTROBAN) 2 % Apply 1 application topically 3 (three) times daily. 30 g 2  . pantoprazole (PROTONIX) 40 MG tablet Take 1 tablet (40 mg total) by mouth daily as needed (GERD). 30 tablet 1  .  tranexamic acid (LYSTEDA) 650 MG TABS tablet Take 2 tablets (1,300 mg total) by mouth 3 (three) times daily. Take during menses for a maximum of five days 30 tablet 2  . vitamin B-12 (CYANOCOBALAMIN) 50 MCG tablet Take 50 mcg by mouth daily.    Marland Kitchen zinc gluconate 50 MG tablet Take 50 mg by mouth daily.     No facility-administered medications prior to visit.    Allergies  Allergen Reactions  . Sulfa Antibiotics     Bactrim    ROS Review of Systems  Constitutional: Negative for chills and fever.  HENT: Negative for congestion.   Eyes: Negative for photophobia.  Respiratory: Negative for cough and shortness of breath.   Cardiovascular: Negative for chest pain.  Gastrointestinal: Negative for nausea and vomiting.  Endocrine: Negative.   Genitourinary: Negative.   Musculoskeletal: Negative.   Skin: Negative for rash and wound.  Allergic/Immunologic: Negative.   Neurological: Positive for headaches. Negative for dizziness, syncope and speech difficulty.  Hematological: Negative.   Psychiatric/Behavioral: Negative.       Objective:    Physical Exam Vitals and nursing note reviewed. Exam conducted with a chaperone present.  Constitutional:      General: She is not in acute distress.    Appearance: Normal appearance. She is not ill-appearing.  HENT:     Head: Normocephalic and atraumatic.     Right Ear: External ear normal.     Left Ear: External ear normal.     Nose: Nose normal.     Mouth/Throat:     Mouth: Mucous membranes are moist.     Pharynx: Oropharynx is clear.  Eyes:     Extraocular Movements: Extraocular movements intact.     Conjunctiva/sclera: Conjunctivae normal.     Pupils: Pupils are equal, round, and reactive to light.  Cardiovascular:     Rate and Rhythm: Normal rate and regular rhythm.     Pulses: Normal pulses.     Heart sounds: Normal heart sounds.  Pulmonary:     Effort: Pulmonary effort is normal.     Breath sounds: Normal breath sounds.  Chest:   Breasts:     Right: No swelling, mass, nipple discharge, tenderness or axillary adenopathy.     Left: Tenderness present. No swelling, mass, nipple discharge or axillary adenopathy.    Musculoskeletal:        General: Normal range of motion.     Cervical back: Normal range of motion and neck supple.  Lymphadenopathy:     Upper Body:     Right upper body: No axillary adenopathy.     Left upper body: No axillary adenopathy.  Skin:    General: Skin is warm and dry.  Neurological:  General: No focal deficit present.     Mental Status: She is alert and oriented to person, place, and time.  Psychiatric:        Mood and Affect: Mood normal.        Behavior: Behavior normal.        Thought Content: Thought content normal.     There were no vitals taken for this visit. Wt Readings from Last 3 Encounters:  03/23/20 224 lb 9.6 oz (101.9 kg)  03/10/20 243 lb 4.8 oz (110.4 kg)  02/04/20 241 lb (109.3 kg)     Health Maintenance Due  Topic Date Due  . COVID-19 Vaccine (1) Never done  . INFLUENZA VACCINE  Never done    There are no preventive care reminders to display for this patient.  Lab Results  Component Value Date   TSH 0.549 08/28/2019   Lab Results  Component Value Date   WBC 10.8 (H) 03/23/2020   HGB 11.6 (L) 03/23/2020   HCT 35.7 (L) 03/23/2020   MCV 89.7 03/23/2020   PLT 314 03/23/2020   Lab Results  Component Value Date   NA 137 03/23/2020   K 3.9 03/23/2020   CO2 26 03/23/2020   GLUCOSE 112 (H) 03/23/2020   BUN 14 03/23/2020   CREATININE 0.84 03/23/2020   BILITOT <0.2 (L) 03/23/2020   ALKPHOS 59 03/23/2020   AST 12 (L) 03/23/2020   ALT 12 03/23/2020   PROT 7.6 03/23/2020   ALBUMIN 3.2 (L) 03/23/2020   CALCIUM 9.0 03/23/2020   ANIONGAP 5 03/23/2020   No results found for: CHOL No results found for: HDL No results found for: LDLCALC No results found for: TRIG No results found for: CHOLHDL No results found for: HGBA1C    Assessment & Plan:    Problem List Items Addressed This Visit   None   1. Other iron deficiency anemia  - CBC with Differential/Platelet  2. TTP (thrombotic thrombocytopenic purpura) Encourage patient to keep follow-up with hematology  3. Breast pain, left No palpable mass noted, further review with ultrasound.  Patient education given on pain management - US BREAST LTD UNI LEFT INC AXILLA; Future  4. New onset of headaches Encouraged patient to take blood pressure medication on a daily basis, check blood pressure on a daily basis, keep a written log and have available for all office visits, schedule a vision check, increase hydration, work on establishing good sleep hygiene - CBC with Differential/Platelet   I have reviewed the patient's medical history (PMH, PSH, Social History, Family History, Medications, and allergies) , and have been updated if relevant. I spent 30 minutes reviewing chart and  face to face time with patient.     No orders of the defined types were placed in this encounter.   Follow-up: No follow-ups on file.    Loraine Grip Kijana Cromie, PA-C

## 2020-07-21 ENCOUNTER — Other Ambulatory Visit: Payer: Self-pay | Admitting: Physician Assistant

## 2020-07-21 ENCOUNTER — Telehealth: Payer: Self-pay | Admitting: *Deleted

## 2020-07-21 DIAGNOSIS — N644 Mastodynia: Secondary | ICD-10-CM | POA: Insufficient documentation

## 2020-07-21 DIAGNOSIS — R519 Headache, unspecified: Secondary | ICD-10-CM | POA: Insufficient documentation

## 2020-07-21 LAB — CBC WITH DIFFERENTIAL/PLATELET
Basophils Absolute: 0.1 10*3/uL (ref 0.0–0.2)
Basos: 1 %
EOS (ABSOLUTE): 0.2 10*3/uL (ref 0.0–0.4)
Eos: 1 %
Hematocrit: 37.4 % (ref 34.0–46.6)
Hemoglobin: 12.7 g/dL (ref 11.1–15.9)
Immature Grans (Abs): 0.1 10*3/uL (ref 0.0–0.1)
Immature Granulocytes: 1 %
Lymphocytes Absolute: 5.8 10*3/uL — ABNORMAL HIGH (ref 0.7–3.1)
Lymphs: 45 %
MCH: 29.4 pg (ref 26.6–33.0)
MCHC: 34 g/dL (ref 31.5–35.7)
MCV: 87 fL (ref 79–97)
Monocytes Absolute: 0.7 10*3/uL (ref 0.1–0.9)
Monocytes: 5 %
Neutrophils Absolute: 6.2 10*3/uL (ref 1.4–7.0)
Neutrophils: 47 %
Platelets: 389 10*3/uL (ref 150–450)
RBC: 4.32 x10E6/uL (ref 3.77–5.28)
RDW: 12.9 % (ref 11.7–15.4)
WBC: 12.9 10*3/uL — ABNORMAL HIGH (ref 3.4–10.8)

## 2020-07-21 NOTE — Telephone Encounter (Signed)
Patient verified DOB Patient is aware of WBC being slightly elevated and needing to be monitored. Patient is also aware of hemoglobin/iron level being normal. MA spoke with Breast center and patients appointment is the first available. Facility does not keep a waiting list so patient will have to call daily to ask about cancellations.

## 2020-07-21 NOTE — Telephone Encounter (Signed)
-----   Message from Kennieth Rad, [place] sent at 07/21/2020  2:32 PM EST ----- Please call patient and let her know that her white blood cells are slightly elevated, her hemoglobin is WNL.  We will continue to monitor her WBC count

## 2020-08-05 ENCOUNTER — Encounter: Payer: Medicaid Other | Admitting: Internal Medicine

## 2020-08-27 ENCOUNTER — Encounter: Payer: Medicaid Other | Admitting: Internal Medicine

## 2020-09-02 ENCOUNTER — Ambulatory Visit: Admission: RE | Admit: 2020-09-02 | Payer: Medicaid Other | Source: Ambulatory Visit

## 2020-09-02 ENCOUNTER — Ambulatory Visit
Admission: RE | Admit: 2020-09-02 | Discharge: 2020-09-02 | Disposition: A | Discharge status: Home / Self Care | Payer: Medicaid Other | Source: Ambulatory Visit | Attending: Physician Assistant | Admitting: Physician Assistant

## 2020-09-02 ENCOUNTER — Other Ambulatory Visit: Payer: Self-pay

## 2020-09-02 ENCOUNTER — Other Ambulatory Visit: Payer: Self-pay | Admitting: Physician Assistant

## 2020-09-02 DIAGNOSIS — N644 Mastodynia: Secondary | ICD-10-CM

## 2020-09-06 ENCOUNTER — Telehealth: Payer: Self-pay | Admitting: *Deleted

## 2020-09-06 NOTE — Telephone Encounter (Signed)
-----   Message from Kennieth Rad, [place] sent at 09/02/2020 12:57 PM EDT ----- Please call patient and let her know that her mammogram and ultrasound did not show any nodules.  They did show an asymmetry which is likely benign, however recommends a 66-month repeat follow-up diagnostic right breast mammogram.  They also recommend to consider genetics assessment to determine the patient's lifetime risk of breast cancer given her family history.  The results also shared some suggestions to help her with breast pain, very similar to the ones we shared during her office visit.

## 2020-09-06 NOTE — Telephone Encounter (Signed)
Patient verified DOB Patient is aware of no nodules being noted and only asymmetry. Patient advised to have a repeat diagnostic of the right breast in 6 months and to use the tips shared for the pain.

## 2020-09-10 ENCOUNTER — Telehealth: Payer: Self-pay | Admitting: Hematology

## 2020-09-10 NOTE — Telephone Encounter (Signed)
Rescheduled upcoming appointment due to provider's PAL. Patient is aware of changes. ?

## 2020-09-20 ENCOUNTER — Encounter: Payer: Self-pay | Admitting: Internal Medicine

## 2020-09-20 ENCOUNTER — Other Ambulatory Visit: Payer: Self-pay

## 2020-09-20 ENCOUNTER — Ambulatory Visit (INDEPENDENT_AMBULATORY_CARE_PROVIDER_SITE_OTHER): Payer: Medicaid Other | Admitting: Internal Medicine

## 2020-09-20 VITALS — BP 127/90 | HR 97 | Temp 98.6°F | Ht 67.0 in | Wt 234.8 lb

## 2020-09-20 DIAGNOSIS — G8929 Other chronic pain: Secondary | ICD-10-CM

## 2020-09-20 DIAGNOSIS — Z13228 Encounter for screening for other metabolic disorders: Secondary | ICD-10-CM | POA: Diagnosis not present

## 2020-09-20 DIAGNOSIS — R5383 Other fatigue: Secondary | ICD-10-CM | POA: Diagnosis not present

## 2020-09-20 DIAGNOSIS — Z0000 Encounter for general adult medical examination without abnormal findings: Secondary | ICD-10-CM | POA: Diagnosis not present

## 2020-09-20 DIAGNOSIS — R519 Headache, unspecified: Secondary | ICD-10-CM | POA: Diagnosis not present

## 2020-09-20 DIAGNOSIS — L732 Hidradenitis suppurativa: Secondary | ICD-10-CM

## 2020-09-20 NOTE — Progress Notes (Signed)
Subjective:    Sarah Washington - 35 y.o. female MRN [id number]  Date of birth: Oct 03, 1985  HPI  Sarah Washington is here for annual exam.  Reports fatigue and morning headaches. Reports she is going to bed at an early enough time and feels like she is getting plenty of sleep each night. Doesn't feel like she is under a particularly large amount. No headaches later in the day, seems to wake up with them. No snoring at night. No trouble breathing at night. She is compliant with her BP medications. Never had a sleep study.    Depression screen [facility] 2/9 09/20/2020 03/11/2020 02/04/2020  Decreased Interest 2 0 2  Down, Depressed, Hopeless 0 1 2  PHQ - 2 Score 2 1 4   Altered sleeping 2 1 1   Tired, decreased energy 2 3 3   Change in appetite 0 1 2  Feeling bad or failure about yourself  0 1 2  Trouble concentrating 2 0 2  Moving slowly or fidgety/restless 0 0 2  Suicidal thoughts 0 0 0  PHQ-9 Score 8 7 16     Health Maintenance:  Health Maintenance Due  Topic Date Due  . COVID-19 Vaccine (1) Never done    -  reports that she has been smoking cigarettes. She has never used smokeless tobacco. - Review of Systems: Per HPI. - Past Medical History: Patient Active Problem List   Diagnosis Date Noted  . Breast pain, left 07/21/2020  . New onset of headaches 07/21/2020  . Iron deficiency anemia 11/24/2019  . LGSIL on Pap smear of cervix 08/14/2019  . Menorrhagia with regular cycle 08/11/2019  . Encntr for gyn exam (general) (routine) w abnormal findings 08/11/2019  . Hidradenitis suppurativa 08/11/2019  . TTP (thrombotic thrombocytopenic purpura) 11/26/2017  . Counseling regarding advance care planning and goals of care 11/26/2017  . Anxiety and depression 11/01/2017   - Medications: reviewed and updated   Objective:   Physical Exam BP 127/90   Pulse 97   Temp 98.6 F (37 C) (Oral)   Ht 5\' 7"  (1.702 m)   Wt 234 lb 12.8 oz (106.5 kg)   SpO2 95%   BMI 36.77 kg/m   Physical Exam Constitutional:      Appearance: She is not diaphoretic.  HENT:     Head: Normocephalic and atraumatic.  Eyes:     Conjunctiva/sclera: Conjunctivae normal.     Pupils: Pupils are equal, round, and reactive to light.  Neck:     Thyroid: No thyromegaly.  Cardiovascular:     Rate and Rhythm: Normal rate and regular rhythm.     Heart sounds: Normal heart sounds. No murmur heard.   Pulmonary:     Effort: Pulmonary effort is normal. No respiratory distress.     Breath sounds: Normal breath sounds. No wheezing.  Abdominal:     General: Bowel sounds are normal. There is no distension.     Palpations: Abdomen is soft.     Tenderness: There is no abdominal tenderness. There is no guarding or rebound.  Musculoskeletal:        General: No deformity. Normal range of motion.     Cervical back: Normal range of motion and neck supple.  Lymphadenopathy:     Cervical: No cervical adenopathy.  Skin:    General: Skin is warm and dry.     Findings: No rash.  Neurological:     Mental Status: She is alert and oriented to person, place, and time.  Gait: Gait is intact.  Psychiatric:        Mood and Affect: Mood and affect normal.        Judgment: Judgment normal.            Assessment & Plan:    1. Encounter for annual physical exam Counseled on 150 minutes of exercise per week, healthy eating (including decreased daily intake of saturated fats, cholesterol, added sugars, sodium), STI prevention, routine healthcare maintenance.  2. Screening for metabolic disorder - Comprehensive metabolic panel - Lipid panel  3. Chronic nonintractable headache, unspecified headache type - Split night study; Future  4. Fatigue, unspecified type Patient with chronic iron deficiency anemia, followed by hematology. Last HgB stable and within range. PHQ-9 score concerning for mild depression; however, most of her score is related to fatigue and sleep disturbances. Will monitor other  labs for etiologies of fatigue. Given feeling poorly rested after adequate amount of sleep, BMI> 30, and morning headaches warrants work up for OSA. - TSH - VITAMIN D 25 Hydroxy (Vit-D Deficiency, Fractures) - Vitamin B12 - Split night study; Future  5. Hidradenitis suppurativa - Ambulatory referral to Dermatology   Phill Myron, D.O. 09/20/2020, 10:32 AM Primary Care at [facility]

## 2020-09-20 NOTE — Progress Notes (Signed)
Here for physical today. Having swelling and pain  in feet Very fatigue. Morning headaches. Re current boils.

## 2020-09-20 NOTE — Patient Instructions (Signed)
Preventive Care 21-37 Years Old, Female Preventive care refers to lifestyle choices and visits with your health care provider that can promote health and wellness. This includes:  A yearly physical exam. This is also called an annual wellness visit.  Regular dental and eye exams.  Immunizations.  Screening for certain conditions.  Healthy lifestyle choices, such as: ? Eating a healthy diet. ? Getting regular exercise. ? Not using drugs or products that contain nicotine and tobacco. ? Limiting alcohol use. What can I expect for my preventive care visit? Physical exam Your health care provider may check your:  Height and weight. These may be used to calculate your BMI (body mass index). BMI is a measurement that tells if you are at a healthy weight.  Heart rate and blood pressure.  Body temperature.  Skin for abnormal spots. Counseling Your health care provider may ask you questions about your:  Past medical problems.  Family's medical history.  Alcohol, tobacco, and drug use.  Emotional well-being.  Home life and relationship well-being.  Sexual activity.  Diet, exercise, and sleep habits.  Work and work environment.  Access to firearms.  Method of birth control.  Menstrual cycle.  Pregnancy history. What immunizations do I need? Vaccines are usually given at various ages, according to a schedule. Your health care provider will recommend vaccines for you based on your age, medical history, and lifestyle or other factors, such as travel or where you work.   What tests do I need? Blood tests  Lipid and cholesterol levels. These may be checked every 5 years starting at age 20.  Hepatitis C test.  Hepatitis B test. Screening  Diabetes screening. This is done by checking your blood sugar (glucose) after you have not eaten for a while (fasting).  STD (sexually transmitted disease) testing, if you are at risk.  BRCA-related cancer screening. This may be  done if you have a family history of breast, ovarian, tubal, or peritoneal cancers.  Pelvic exam and Pap test. This may be done every 3 years starting at age 21. Starting at age 30, this may be done every 5 years if you have a Pap test in combination with an HPV test. Talk with your health care provider about your test results, treatment options, and if necessary, the need for more tests.   Follow these instructions at home: Eating and drinking  Eat a healthy diet that includes fresh fruits and vegetables, whole grains, lean protein, and low-fat dairy products.  Take vitamin and mineral supplements as recommended by your health care provider.  Do not drink alcohol if: ? Your health care provider tells you not to drink. ? You are pregnant, may be pregnant, or are planning to become pregnant.  If you drink alcohol: ? Limit how much you have to 0-1 drink a day. ? Be aware of how much alcohol is in your drink. In the [place] one drink equals one 12 oz bottle of beer (355 mL), one 5 oz glass of wine (148 mL), or one 1 oz glass of hard liquor (44 mL).   Lifestyle  Take daily care of your teeth and gums. Brush your teeth every morning and night with fluoride toothpaste. Floss one time each day.  Stay active. Exercise for at least 30 minutes 5 or more days each week.  Do not use any products that contain nicotine or tobacco, such as cigarettes, e-cigarettes, and chewing tobacco. If you need help quitting, ask your health care provider.  Do not   use drugs.  If you are sexually active, practice safe sex. Use a condom or other form of protection to prevent STIs (sexually transmitted infections).  If you do not wish to become pregnant, use a form of birth control. If you plan to become pregnant, see your health care provider for a prepregnancy visit.  Find healthy ways to cope with stress, such as: ? Meditation, yoga, or listening to music. ? Journaling. ? Talking to a trusted  person. ? Spending time with friends and family. Safety  Always wear your seat belt while driving or riding in a vehicle.  Do not drive: ? If you have been drinking alcohol. Do not ride with someone who has been drinking. ? When you are tired or distracted. ? While texting.  Wear a helmet and other protective equipment during sports activities.  If you have firearms in your house, make sure you follow all gun safety procedures.  Seek help if you have been physically or sexually abused. What's next?  Go to your health care provider once a year for an annual wellness visit.  Ask your health care provider how often you should have your eyes and teeth checked.  Stay up to date on all vaccines. This information is not intended to replace advice given to you by your health care provider. Make sure you discuss any questions you have with your health care provider. Document Revised: 01/18/2020 Document Reviewed: 01/31/2018 Elsevier Patient Education  2021 Elsevier Inc.  

## 2020-09-21 ENCOUNTER — Other Ambulatory Visit: Payer: Medicaid Other

## 2020-09-21 ENCOUNTER — Ambulatory Visit: Payer: Medicaid Other | Admitting: Hematology

## 2020-09-21 LAB — COMPREHENSIVE METABOLIC PANEL
ALT: 14 IU/L (ref 0–32)
AST: 14 IU/L (ref 0–40)
Albumin/Globulin Ratio: 1.1 — ABNORMAL LOW (ref 1.2–2.2)
Albumin: 4.4 g/dL (ref 3.8–4.8)
Alkaline Phosphatase: 69 IU/L (ref 44–121)
BUN/Creatinine Ratio: 10 (ref 9–23)
BUN: 9 mg/dL (ref 6–20)
Bilirubin Total: 0.3 mg/dL (ref 0.0–1.2)
CO2: 23 mmol/L (ref 20–29)
Calcium: 9.4 mg/dL (ref 8.7–10.2)
Chloride: 99 mmol/L (ref 96–106)
Creatinine, Ser: 0.89 mg/dL (ref 0.57–1.00)
Globulin, Total: 4 g/dL (ref 1.5–4.5)
Glucose: 90 mg/dL (ref 65–99)
Potassium: 4.4 mmol/L (ref 3.5–5.2)
Sodium: 137 mmol/L (ref 134–144)
Total Protein: 8.4 g/dL (ref 6.0–8.5)
eGFR: 86 mL/min/{1.73_m2} (ref >59)

## 2020-09-21 LAB — LIPID PANEL
Chol/HDL Ratio: 5.1 ratio — ABNORMAL HIGH (ref 0.0–4.4)
Cholesterol, Total: 173 mg/dL (ref 100–199)
HDL: 34 mg/dL — ABNORMAL LOW (ref >39)
LDL Chol Calc (NIH): 113 mg/dL — ABNORMAL HIGH (ref 0–99)
Triglycerides: 146 mg/dL (ref 0–149)
VLDL Cholesterol Cal: 26 mg/dL (ref 5–40)

## 2020-09-24 ENCOUNTER — Encounter: Payer: Medicaid Other | Admitting: Internal Medicine

## 2020-09-27 ENCOUNTER — Telehealth: Payer: Self-pay | Admitting: *Deleted

## 2020-09-27 NOTE — Telephone Encounter (Signed)
Called [facility] to add on lab test: TSH, Vit B12, Vit D. Will fax over form  Should be able to add lab test on if there is enough specimen.

## 2020-10-04 NOTE — Progress Notes (Incomplete)
HEMATOLOGY/ONCOLOGY CLINIC NOTE  Date of Service: 10/04/2020  Patient Care Team: Nicolette Bang, DO as PCP - General (Family Medicine) Tobe Sos, PA-C as Consulting Physician (Dermatology)  CHIEF COMPLAINTS/PURPOSE OF CONSULTATION:  F/u for Thrombotic microangiopathy  HISTORY OF PRESENTING ILLNESS:   Sarah Washington is a wonderful 35 y.o. female who has been referred to [place] by Dr Mabeline Caras for evaluation and management of recently diagnosed acquired TTP/Thrombotic microangiopathy. She is accompanied today by her three children. The pt reports that she is doing well overall.   The pt reports that in January 2019 she began feeling weak, tired, and developed a newly heavy menstrual cycle. She also sites bruising easily that began occurring in January 2019 but denies nose bleeds, gum bleeds, and blood in the stools at the time. She notes that she first had labs to evaluate these symptoms She noted finger tip neuropathy, severe headache, abdominal pains, and tingling in her feet. She denies any infections at the time, PO contraceptives, hormone therapy, and new medications at the time. She also denies recent blood transfusions as well. She was discharged with Atovaquone (for PCP prophylaxis). 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  The pt presented to [facility] on 11/02/17 for TTP.  Her last plasma exchange was on 11/07/17. She received 5 total plasma exchanges. She received her third Rituxan infusion on 11/15/17, and only notes some mild itching but denies any rashes with her infusion. She has not yet received her fourth dose or Rituxan as she has moved from [facility] to [place]. She has been taking 30mg  Prednisone since 11/15/17.  She adds that she has gained 20 pounds of weight, noting that she has a very strong appetite  while taking steroids.    The pt has previously taken BP medication for her HTN.  She received 5 units of blood, and no plasma exchange after delivery of her third child was premature at 5 months due to HELLP syndrome.   Most recent lab results (11/15/17) of CBC  is as follows: all values are WNL except for WBC at 12.4k, RBC at 3.69, HGB at 11.8, RDW at 22.3, MPV at 6.9, PLT at 137k.  On review of systems, pt reports increased appetite, weight gain, normalized menstrual bleeding, and denies headaches, peripheral neuropathy, fevers, chills, night sweats, back pains, abdominal pains, changes in bowel habits, and any other symptoms.   On PMHx the pt reports HTN, tubal ligation in 2010, HELLP syndrome with third pregnancy requiring delivery after 5 months. Angioedema reaction to sulfa drugs.  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  On Family Hx the pt denies any blood, cancer, or autoimmune disorders.   Interval History:   [name] returns today regarding management and evaluation of her TTP. The patient's last visit with [place] was on 03/23/2020. The pt reports that she is doing well overall.  The pt reports ***  Lab results today 10/05/2020 of CBC w/diff and CMP is as follows: all values are WNL except for ***  On review of systems, pt reports *** and denies *** and any other symptoms.   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. Angioedema reaction to sulfa drugs.    SURGICAL HISTORY: Past Surgical History:  Procedure Laterality Date  . TUBAL LIGATION  tubal ligation in 2010  SOCIAL HISTORY: Social History   Socioeconomic History  . Marital status: Single    Spouse name: Not on file  . Number of children: 3  . Years of education: Not on file  . Highest education level: Not on file  Occupational History  . Not on file  Tobacco Use  . Smoking status: Current Some Day Smoker     Types: Cigarettes    Last attempt to quit: 10/2017    Years since quitting: 3.0  . Smokeless tobacco: Never Used  Vaping Use  . Vaping Use: Never used  Substance and Sexual Activity  . Alcohol use: Yes  . Drug use: Never  . Sexual activity: Yes    Birth control/protection: Surgical  Other Topics Concern  . Not on file  Social History Narrative  . Not on file   Social Determinants of Health   Financial Resource Strain: Not on file  Food Insecurity: Not on file  Transportation Needs: Not on file  Physical Activity: Not on file  Stress: Not on file  Social Connections: Not on file  Intimate Partner Violence: Not on file    FAMILY HISTORY: Family History  Problem Relation Age of Onset  . Hypertension Mother   . Diabetes Mother   . Diabetes Father   . Hypertension Father   . Mental illness Father   . Arthritis Maternal Grandmother   . Cancer Maternal Grandmother   . Diabetes Maternal Grandmother   . Hypertension Maternal Grandmother   . Stroke Maternal Grandmother   . Breast cancer Maternal Grandmother   . Stroke Maternal Grandfather   . Arthritis Paternal Grandmother   . Cancer Paternal Grandmother   . Diabetes Paternal Grandmother   . Hypertension Paternal Grandmother   . Breast cancer Paternal Grandmother     ALLERGIES:  is allergic to sulfa antibiotics.  MEDICATIONS:  Current Outpatient Medications  Medication Sig Dispense Refill  . amLODipine (NORVASC) 2.5 MG tablet Take 1 tablet (2.5 mg total) by mouth daily. 90 tablet 3  . cholecalciferol (VITAMIN D3) 25 MCG (1000 UNIT) tablet Take 1,000 Units by mouth daily.    . hydrochlorothiazide (HYDRODIURIL) 25 MG tablet Take 1 tablet (25 mg total) by mouth daily. 90 tablet 3  . iron polysaccharides (NIFEREX) 150 MG capsule Take 1 capsule (150 mg total) by mouth 2 (two) times daily. 60 capsule 3  . Multiple Vitamin (MULTIVITAMIN WITH MINERALS) TABS tablet Take 1 tablet by mouth daily.    . pantoprazole (PROTONIX) 40  MG tablet Take 1 tablet (40 mg total) by mouth daily as needed (GERD). 30 tablet 1  . tranexamic acid (LYSTEDA) 650 MG TABS tablet Take 2 tablets (1,300 mg total) by mouth 3 (three) times daily. Take during menses for a maximum of five days 30 tablet 2  . vitamin B-12 (CYANOCOBALAMIN) 50 MCG tablet Take 50 mcg by mouth daily.    Marland Kitchen zinc gluconate 50 MG tablet Take 50 mg by mouth daily.     No current facility-administered medications for this visit.    REVIEW OF SYSTEMS:   10 Point review of Systems was done is negative except as noted above.  PHYSICAL EXAMINATION: ECOG FS:1 - Symptomatic but completely ambulatory  There were no vitals filed for this visit. Wt Readings from Last 3 Encounters:  09/20/20 234 lb 12.8 oz (106.5 kg)  07/20/20 246 lb (111.6 kg)  03/23/20 224 lb 9.6 oz (101.9 kg)   There is no height or weight on  file to calculate BMI.    *** GENERAL:alert, in no acute distress and comfortable SKIN: no acute rashes, no significant lesions EYES: conjunctiva are pink and non-injected, sclera anicteric OROPHARYNX: MMM, no exudates, no oropharyngeal erythema or ulceration NECK: supple, no JVD LYMPH:  no palpable lymphadenopathy in the cervical, axillary or inguinal regions LUNGS: clear to auscultation b/l with normal respiratory effort HEART: regular rate & rhythm ABDOMEN:  normoactive bowel sounds , non tender, not distended. No palpable hepatosplenomegaly.  Extremity: no pedal edema PSYCH: alert & oriented x 3 with fluent speech NEURO: no focal motor/sensory deficits  LABORATORY DATA:  I have reviewed the data as listed     11/15/17      . CBC Latest Ref Rng & Units 07/20/2020 03/23/2020 11/24/2019  WBC 3.4 - 10.8 x10E3/uL 12.9(H) 10.8(H) 9.5  Hemoglobin 11.1 - 15.9 g/dL 12.7 11.6(L) 10.4(L)  Hematocrit 34.0 - 46.6 % 37.4 35.7(L) 32.7(L)  Platelets 150 - 450 x10E3/uL 389 314 358    . CMP Latest Ref Rng & Units 09/20/2020 03/23/2020 11/24/2019  Glucose 65 -  99 mg/dL 90 112(H) 91  BUN 6 - 20 mg/dL 9 14 13   Creatinine 0.57 - 1.00 mg/dL 0.89 0.84 1.00  Sodium 134 - 144 mmol/L 137 137 140  Potassium 3.5 - 5.2 mmol/L 4.4 3.9 4.1  Chloride 96 - 106 mmol/L 99 106 107  CO2 20 - 29 mmol/L 23 26 26   Calcium 8.7 - 10.2 mg/dL 9.4 9.0 8.8(L)  Total Protein 6.0 - 8.5 g/dL 8.4 7.6 7.5  Total Bilirubin 0.0 - 1.2 mg/dL 0.3 <0.2(L) <0.2(L)  Alkaline Phos 44 - 121 IU/L 69 59 62  AST 0 - 40 IU/L 14 12(L) 12(L)  ALT 0 - 32 IU/L 14 12 14    . Lab Results  Component Value Date   IRON 65 03/23/2020   TIBC 259 03/23/2020   IRONPCTSAT 25 03/23/2020   (Iron and TIBC)  Lab Results  Component Value Date   FERRITIN 202 03/23/2020    Lab Results  Component Value Date   LDH 139 02/21/2019   Component     Latest Ref Rng & Units 11/26/2017  Adamts 13 Activity     >66.8 % 69.0  ADAMTS13 Activity comment      Comment    RADIOGRAPHIC STUDIES: I have personally reviewed the radiological images as listed and agreed with the findings in the report. No results found.  ASSESSMENT & PLAN:   35 y.o. female with hydradenitis suppurativa and   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  Severely low ADAMTS 13 levels of 6 on diagnosis. Now normalized to 69 ( ADAMTS13 from 11/26/17 ) S/p Plasmapheresis x 5. Completed Rituxan weekly x 4 doses.  2. RUE swelling -Right Upper Extremity Venous US from 12/10/17 showed no evidence of DVT Final Interpretation: Right: No evidence of deep vein thrombosis in the upper extremity. No evidence of superficial vein thrombosis in the upper extremity. Left: No evidence of thrombosis in the subclavian.  PLAN: -Discussed pt labwork today, 10/05/2020; ***    -Pt is still in remission from TTP. Will continue watchful observation. -Continue 150 mg Iron Polysaccharide daily  -Will see back in ***   FOLLOW UP: ***   The total time spent in the appt was *** minutes and more than 50% was on counseling and  direct patient cares.  All of the patient's questions were answered with apparent satisfaction. The patient knows to call the clinic with any problems, questions  or concerns.   Sullivan Lone MD [place] AAHIVMS [facility] Hematology/Oncology Physician [facility]  (Office):       [phone] (Work cell):  [phone] (Fax):           [phone]  10/04/2020 6:32 PM  I, Reinaldo Raddle, am acting as scribe for Dr. Sullivan Lone, MD.

## 2020-10-05 ENCOUNTER — Other Ambulatory Visit: Payer: Medicaid Other

## 2020-10-05 ENCOUNTER — Telehealth: Payer: Self-pay | Admitting: Hematology

## 2020-10-05 ENCOUNTER — Ambulatory Visit: Payer: Medicaid Other | Admitting: Hematology

## 2020-10-05 ENCOUNTER — Telehealth: Payer: Self-pay | Admitting: Internal Medicine

## 2020-10-05 NOTE — Telephone Encounter (Signed)
Kia from Sleep Study called stating [facility] denied the sleep study. Kia stated they usually approve a home sleep study. Please call Kia at [phone].

## 2020-10-05 NOTE — Telephone Encounter (Signed)
Called pt to r/s appts per 5/3 sch msg. No answer. Left msg for pt to call back to r/s.

## 2020-10-06 ENCOUNTER — Other Ambulatory Visit: Payer: Self-pay | Admitting: Internal Medicine

## 2020-10-06 DIAGNOSIS — G8929 Other chronic pain: Secondary | ICD-10-CM

## 2020-10-06 DIAGNOSIS — R5383 Other fatigue: Secondary | ICD-10-CM

## 2020-10-06 DIAGNOSIS — R519 Headache, unspecified: Secondary | ICD-10-CM

## 2020-10-06 NOTE — Telephone Encounter (Signed)
Home sleep study ordered.   Sarah Washington, D.O. Primary Care at [facility]  10/06/2020, 10:12 AM

## 2020-10-12 ENCOUNTER — Telehealth: Payer: Self-pay | Admitting: Hematology

## 2020-10-12 LAB — SPECIMEN STATUS REPORT

## 2020-10-12 LAB — TSH: TSH: 1.95 u[IU]/mL (ref 0.450–4.500)

## 2020-10-12 LAB — VITAMIN D 25 HYDROXY (VIT D DEFICIENCY, FRACTURES): Vit D, 25-Hydroxy: 17.9 ng/mL — ABNORMAL LOW (ref 30.0–100.0)

## 2020-10-12 LAB — VITAMIN B12

## 2020-10-12 NOTE — Telephone Encounter (Signed)
Pt returned call to r/s appts from 5/3. Pt aware of updated appts.

## 2020-10-12 NOTE — Progress Notes (Signed)
HEMATOLOGY/ONCOLOGY CLINIC NOTE  Date of Service: 10/13/2020  Patient Care Team: Nicolette Bang, DO as PCP - General (Family Medicine) Tobe Sos, PA-C as Consulting Physician (Dermatology)  CHIEF COMPLAINTS/PURPOSE OF CONSULTATION:  F/u for Thrombotic microangiopathy  HISTORY OF PRESENTING ILLNESS:   Sarah Washington is a wonderful 35 y.o. female who has been referred to [place] by Dr Mabeline Caras for evaluation and management of recently diagnosed acquired TTP/Thrombotic microangiopathy. She is accompanied today by her three children. The pt reports that she is doing well overall.   The pt reports that in January 2019 she began feeling weak, tired, and developed a newly heavy menstrual cycle. She also sites bruising easily that began occurring in January 2019 but denies nose bleeds, gum bleeds, and blood in the stools at the time. She notes that she first had labs to evaluate these symptoms She noted finger tip neuropathy, severe headache, abdominal pains, and tingling in her feet. She denies any infections at the time, PO contraceptives, hormone therapy, and new medications at the time. She also denies recent blood transfusions as well. She was discharged with Atovaquone (for PCP prophylaxis). 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  The pt presented to [facility] on 11/02/17 for TTP.  Her last plasma exchange was on 11/07/17. She received 5 total plasma exchanges. She received her third Rituxan infusion on 11/15/17, and only notes some mild itching but denies any rashes with her infusion. She has not yet received her fourth dose or Rituxan as she has moved from [facility] to [place]. She has been taking 30mg  Prednisone since 11/15/17.  She adds that she has gained 20 pounds of weight, noting that she has a very strong appetite  while taking steroids.    The pt has previously taken BP medication for her HTN.  She received 5 units of blood, and no plasma exchange after delivery of her third child was premature at 5 months due to HELLP syndrome.   Most recent lab results (11/15/17) of CBC  is as follows: all values are WNL except for WBC at 12.4k, RBC at 3.69, HGB at 11.8, RDW at 22.3, MPV at 6.9, PLT at 137k.  On review of systems, pt reports increased appetite, weight gain, normalized menstrual bleeding, and denies headaches, peripheral neuropathy, fevers, chills, night sweats, back pains, abdominal pains, changes in bowel habits, and any other symptoms.   On PMHx the pt reports HTN, tubal ligation in 2010, HELLP syndrome with third pregnancy requiring delivery after 5 months. Angioedema reaction to sulfa drugs.  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  On Family Hx the pt denies any blood, cancer, or autoimmune disorders.   INTERVAL HISTORY:    Sarah Washington returns today regarding management and evaluation of her TTP. The patient's last visit with [place] was on 03/23/2020. The pt reports that she is doing well overall.  The pt reports no new symptoms or concerns. The pt has stayed busy with work. The pt notes she just went to the doctor for pain in her left breast that lasted one month. This has since resolved, but they told her she has a light spot on her mammogram she had. They are continuing to watch this and decided not to biopsy this. The pt notes she will be drinking more water, as she has not been drinking  as much recently. The pt notes the oral iron prescribed makes her constipated. She has been taking this twice weekly. The pt notes her periods have been consistently heavy, lasting 7-8 days with 5 heavy days.  Lab results today 10/13/2020 of CBC w/diff and CMP is as follows: all values are WNL except for WBC of 10.7K, Hgb of 11.9, Lymphs Abs of 4.6K, Glucose of 128, Creatinine of 1.06, Total protein  of 8.2, Albumin of 3.4, AST of 12, Total Bilirubin of 0.2. 10/13/2020 Ferritin 75 10/13/2020 Iron of 46, Sat Ratio of 15.  On review of systems, pt denies sudden weight loss, fevers, chills, decreased appetite,  and any other symptoms.  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. Angioedema reaction to sulfa drugs.    SURGICAL HISTORY: Past Surgical History:  Procedure Laterality Date  . TUBAL LIGATION    tubal ligation in 2010  SOCIAL HISTORY: Social History   Socioeconomic History  . Marital status: Single    Spouse name: Not on file  . Number of children: 3  . Years of education: Not on file  . Highest education level: Not on file  Occupational History  . Not on file  Tobacco Use  . Smoking status: Current Some Day Smoker    Types: Cigarettes    Last attempt to quit: 10/2017    Years since quitting: 3.0  . Smokeless tobacco: Never Used  Vaping Use  . Vaping Use: Never used  Substance and Sexual Activity  . Alcohol use: Yes  . Drug use: Never  . Sexual activity: Yes    Birth control/protection: Surgical  Other Topics Concern  . Not on file  Social History Narrative  . Not on file   Social Determinants of Health   Financial Resource Strain: Not on file  Food Insecurity: Not on file  Transportation Needs: Not on file  Physical Activity: Not on file  Stress: Not on file  Social Connections: Not on file  Intimate Partner Violence: Not on file    FAMILY HISTORY: Family History  Problem Relation Age of Onset  . Hypertension Mother   . Diabetes Mother   . Diabetes Father   . Hypertension Father   . Mental illness Father   . Arthritis Maternal Grandmother   . Cancer Maternal Grandmother   . Diabetes Maternal Grandmother   . Hypertension Maternal Grandmother   . Stroke Maternal Grandmother   . Breast cancer Maternal Grandmother   . Stroke Maternal Grandfather   . Arthritis  Paternal Grandmother   . Cancer Paternal Grandmother   . Diabetes Paternal Grandmother   . Hypertension Paternal Grandmother   . Breast cancer Paternal Grandmother     ALLERGIES:  is allergic to sulfa antibiotics.  MEDICATIONS:  Current Outpatient Medications  Medication Sig Dispense Refill  . amLODipine (NORVASC) 2.5 MG tablet Take 1 tablet (2.5 mg total) by mouth daily. 90 tablet 3  . cholecalciferol (VITAMIN D3) 25 MCG (1000 UNIT) tablet Take 1,000 Units by mouth daily.    . hydrochlorothiazide (HYDRODIURIL) 25 MG tablet Take 1 tablet (25 mg total) by mouth daily. 90 tablet 3  . iron polysaccharides (NIFEREX) 150 MG capsule Take 1 capsule (150 mg total) by mouth 2 (two) times daily. 60 capsule 3  . Multiple Vitamin (MULTIVITAMIN WITH MINERALS) TABS tablet Take 1 tablet by mouth daily.    . tranexamic acid (LYSTEDA) 650 MG TABS tablet Take 2 tablets (1,300 mg  total) by mouth 3 (three) times daily. Take during menses for a maximum of five days 30 tablet 2  . vitamin B-12 (CYANOCOBALAMIN) 50 MCG tablet Take 50 mcg by mouth daily.    Marland Kitchen zinc gluconate 50 MG tablet Take 50 mg by mouth daily.    . pantoprazole (PROTONIX) 40 MG tablet Take 1 tablet (40 mg total) by mouth daily as needed (GERD). 30 tablet 1   No current facility-administered medications for this visit.    REVIEW OF SYSTEMS:   10 Point review of Systems was done is negative except as noted above.  PHYSICAL EXAMINATION: ECOG FS:1 - Symptomatic but completely ambulatory  Vitals:   10/13/20 1437  BP: 124/82  Pulse: 99  Resp: 18  Temp: 97.9 F (36.6 C)  SpO2: 100%   Wt Readings from Last 3 Encounters:  10/13/20 243 lb 8 oz (110.5 kg)  09/20/20 234 lb 12.8 oz (106.5 kg)  07/20/20 246 lb (111.6 kg)   Body mass index is 38.14 kg/m.     GENERAL:alert, in no acute distress and comfortable SKIN: no acute rashes, no significant lesions EYES: conjunctiva are pink and non-injected, sclera anicteric OROPHARYNX: MMM, no  exudates, no oropharyngeal erythema or ulceration NECK: supple, no JVD LYMPH:  no palpable lymphadenopathy in the cervical, axillary or inguinal regions LUNGS: clear to auscultation b/l with normal respiratory effort HEART: regular rate & rhythm ABDOMEN:  normoactive bowel sounds , non tender, not distended. No palpable hepatosplenomegaly.  Extremity: no pedal edema PSYCH: alert & oriented x 3 with fluent speech NEURO: no focal motor/sensory deficits  LABORATORY DATA:  I have reviewed the data as listed     11/15/17      . CBC Latest Ref Rng & Units 10/13/2020 07/20/2020 03/23/2020  WBC 4.0 - 10.5 K/uL 10.7(H) 12.9(H) 10.8(H)  Hemoglobin 12.0 - 15.0 g/dL 11.9(L) 12.7 11.6(L)  Hematocrit 36.0 - 46.0 % 36.1 37.4 35.7(L)  Platelets 150 - 400 K/uL 314 389 314    . CMP Latest Ref Rng & Units 10/13/2020 09/20/2020 03/23/2020  Glucose 70 - 99 mg/dL 128(H) 90 112(H)  BUN 6 - 20 mg/dL 12 9 14   Creatinine 0.44 - 1.00 mg/dL 1.06(H) 0.89 0.84  Sodium 135 - 145 mmol/L 139 137 137  Potassium 3.5 - 5.1 mmol/L 3.6 4.4 3.9  Chloride 98 - 111 mmol/L 105 99 106  CO2 22 - 32 mmol/L 26 23 26   Calcium 8.9 - 10.3 mg/dL 9.1 9.4 9.0  Total Protein 6.5 - 8.1 g/dL 8.2(H) 8.4 7.6  Total Bilirubin 0.3 - 1.2 mg/dL 0.2(L) 0.3 <0.2(L)  Alkaline Phos 38 - 126 U/L 59 69 59  AST 15 - 41 U/L 12(L) 14 12(L)  ALT 0 - 44 U/L 12 14 12    . Lab Results  Component Value Date   IRON 46 10/13/2020   TIBC 309 10/13/2020   IRONPCTSAT 15 (L) 10/13/2020   (Iron and TIBC)  Lab Results  Component Value Date   FERRITIN 75 10/13/2020    Component     Latest Ref Rng & Units 11/26/2017  Adamts 13 Activity     >66.8 % 69.0  ADAMTS13 Activity comment      Comment    RADIOGRAPHIC STUDIES: I have personally reviewed the radiological images as listed and agreed with the findings in the report. No results found.  ASSESSMENT & PLAN:   35 y.o. female with hydradenitis suppurativa and   1. Thrombotic  microangiopathy (Acquired TTP) -- unclear trigger.  Severely  low ADAMTS 13 levels of 6 on diagnosis. Now normalized to 69 ( ADAMTS13 from 11/26/17 ) S/p Plasmapheresis x 5. Completed Rituxan weekly x 4 doses.  2. RUE swelling -Right Upper Extremity Venous US from 12/10/17 showed no evidence of DVT Final Interpretation: Right: No evidence of deep vein thrombosis in the upper extremity. No evidence of superficial vein thrombosis in the upper extremity. Left: No evidence of thrombosis in the subclavian.   PLAN: -Discussed pt labwork today, 10/13/2020; Plt normal, Hgb normal, chemistries show normal bilirubin thus pt is not hemolyzing.  -Discussed alternating visits with PCP after the next visit. The pt notes that she does not have a regular schedule w PCP but is working on doing that. -Pt is still in remission from TTP- normal PLTs. Will continue watchful observation. -Continue 150 mg Iron Polysaccharide at least three days weekly. -Recommended pt f/u w OBGYN regarding potential of IUD to control heavy periods that are making her iron deficient. -Will see back in 6 months with labs.   FOLLOW UP: RTC with Dr Irene Limbo with labs in 6 months   The total time spent in the appt was 20 minutes and more than 50% was on counseling and direct patient cares.  All of the patient's questions were answered with apparent satisfaction. The patient knows to call the clinic with any problems, questions or concerns.   Sullivan Lone MD [place] AAHIVMS [facility] Hematology/Oncology Physician [facility]  (Office):       [phone] (Work cell):  [phone] (Fax):           [phone]  10/13/2020 3:24 PM  I, Reinaldo Raddle, am acting as scribe for Dr. Sullivan Lone, MD.      .I have reviewed the above documentation for accuracy and completeness, and I agree with the above. Brunetta Genera MD

## 2020-10-13 ENCOUNTER — Other Ambulatory Visit: Payer: Self-pay

## 2020-10-13 ENCOUNTER — Inpatient Hospital Stay: Payer: Medicaid Other | Attending: Hematology

## 2020-10-13 ENCOUNTER — Inpatient Hospital Stay (HOSPITAL_BASED_OUTPATIENT_CLINIC_OR_DEPARTMENT_OTHER): Payer: Medicaid Other | Admitting: Hematology

## 2020-10-13 VITALS — BP 124/82 | HR 99 | Temp 97.9°F | Resp 18 | Ht 67.0 in | Wt 243.5 lb

## 2020-10-13 DIAGNOSIS — F17210 Nicotine dependence, cigarettes, uncomplicated: Secondary | ICD-10-CM | POA: Insufficient documentation

## 2020-10-13 DIAGNOSIS — M3119 Other thrombotic microangiopathy: Secondary | ICD-10-CM

## 2020-10-13 DIAGNOSIS — D509 Iron deficiency anemia, unspecified: Secondary | ICD-10-CM

## 2020-10-13 LAB — IRON AND TIBC
Iron: 46 ug/dL (ref 41–142)
Saturation Ratios: 15 % — ABNORMAL LOW (ref 21–57)
TIBC: 309 ug/dL (ref 236–444)
UIBC: 263 ug/dL (ref 120–384)

## 2020-10-13 LAB — CBC WITH DIFFERENTIAL/PLATELET
Abs Immature Granulocytes: 0.02 10*3/uL (ref 0.00–0.07)
Basophils Absolute: 0 10*3/uL (ref 0.0–0.1)
Basophils Relative: 0 %
Eosinophils Absolute: 0.1 10*3/uL (ref 0.0–0.5)
Eosinophils Relative: 1 %
HCT: 36.1 % (ref 36.0–46.0)
Hemoglobin: 11.9 g/dL — ABNORMAL LOW (ref 12.0–15.0)
Immature Granulocytes: 0 %
Lymphocytes Relative: 43 %
Lymphs Abs: 4.6 10*3/uL — ABNORMAL HIGH (ref 0.7–4.0)
MCH: 28.5 pg (ref 26.0–34.0)
MCHC: 33 g/dL (ref 30.0–36.0)
MCV: 86.6 fL (ref 80.0–100.0)
Monocytes Absolute: 0.4 10*3/uL (ref 0.1–1.0)
Monocytes Relative: 4 %
Neutro Abs: 5.5 10*3/uL (ref 1.7–7.7)
Neutrophils Relative %: 52 %
Platelets: 314 10*3/uL (ref 150–400)
RBC: 4.17 MIL/uL (ref 3.87–5.11)
RDW: 13.6 % (ref 11.5–15.5)
WBC: 10.7 10*3/uL — ABNORMAL HIGH (ref 4.0–10.5)
nRBC: 0 % (ref 0.0–0.2)

## 2020-10-13 LAB — CMP (CANCER CENTER ONLY)
ALT: 12 U/L (ref 0–44)
AST: 12 U/L — ABNORMAL LOW (ref 15–41)
Albumin: 3.4 g/dL — ABNORMAL LOW (ref 3.5–5.0)
Alkaline Phosphatase: 59 U/L (ref 38–126)
Anion gap: 8 (ref 5–15)
BUN: 12 mg/dL (ref 6–20)
CO2: 26 mmol/L (ref 22–32)
Calcium: 9.1 mg/dL (ref 8.9–10.3)
Chloride: 105 mmol/L (ref 98–111)
Creatinine: 1.06 mg/dL — ABNORMAL HIGH (ref 0.44–1.00)
GFR, Estimated: >60 mL/min (ref >60)
Glucose, Bld: 128 mg/dL — ABNORMAL HIGH (ref 70–99)
Potassium: 3.6 mmol/L (ref 3.5–5.1)
Sodium: 139 mmol/L (ref 135–145)
Total Bilirubin: 0.2 mg/dL — ABNORMAL LOW (ref 0.3–1.2)
Total Protein: 8.2 g/dL — ABNORMAL HIGH (ref 6.5–8.1)

## 2020-10-13 LAB — FERRITIN: Ferritin: 75 ng/mL (ref 11–307)

## 2020-10-13 LAB — ABO/RH: ABO/RH(D): B POS

## 2020-10-13 MED ORDER — PANTOPRAZOLE SODIUM 40 MG PO TBEC: 40.0000 mg | DELAYED_RELEASE_TABLET | Freq: Every day | ORAL | 1 refills | Status: DC | PRN

## 2020-10-14 ENCOUNTER — Telehealth: Payer: Self-pay | Admitting: Hematology

## 2020-10-14 NOTE — Telephone Encounter (Signed)
Scheduled follow-up appointment per 5/11 los. Patient is aware. ?

## 2020-10-19 ENCOUNTER — Other Ambulatory Visit: Payer: Self-pay | Admitting: Internal Medicine

## 2020-10-19 MED ORDER — VITAMIN D (ERGOCALCIFEROL) 1.25 MG (50000 UNIT) PO CAPS: 50000.0000 [IU] | ORAL_CAPSULE | ORAL | 0 refills | Status: DC

## 2020-11-18 NOTE — Progress Notes (Signed)
Patient rescheduled appointment.

## 2020-11-19 ENCOUNTER — Encounter: Payer: Medicaid Other | Admitting: Family

## 2020-11-19 ENCOUNTER — Other Ambulatory Visit: Payer: Self-pay

## 2020-11-22 ENCOUNTER — Other Ambulatory Visit: Payer: Self-pay

## 2020-11-22 ENCOUNTER — Telehealth (INDEPENDENT_AMBULATORY_CARE_PROVIDER_SITE_OTHER): Payer: Medicaid Other | Admitting: Family

## 2020-11-22 DIAGNOSIS — R21 Rash and other nonspecific skin eruption: Secondary | ICD-10-CM | POA: Diagnosis not present

## 2020-11-22 DIAGNOSIS — Z09 Encounter for follow-up examination after completed treatment for conditions other than malignant neoplasm: Secondary | ICD-10-CM | POA: Diagnosis not present

## 2020-11-22 DIAGNOSIS — R1032 Left lower quadrant pain: Secondary | ICD-10-CM

## 2020-11-22 DIAGNOSIS — R5383 Other fatigue: Secondary | ICD-10-CM

## 2020-11-22 DIAGNOSIS — L509 Urticaria, unspecified: Secondary | ICD-10-CM

## 2020-11-22 NOTE — Progress Notes (Signed)
Virtual Visit via Telephone Note  I connected with Sarah Washington, on 11/22/2020 at 8:41 AM by telephone due to the COVID-19 pandemic and verified that I am speaking with the correct person using two identifiers.  Due to current restrictions/limitations of in-office visits due to the COVID-19 pandemic, this scheduled clinical appointment was converted to a telehealth visit.   Consent: I discussed the limitations, risks, security and privacy concerns of performing an evaluation and management service by telephone and the availability of in person appointments. I also discussed with the patient that there may be a patient responsible charge related to this service. The patient expressed understanding and agreed to proceed.   Location of Patient: Home  Location of Provider: [facility] [place] participating in Telemedicine visit: Osmara Drummonds Durene Fruits, NP [place] Else, [place]  History of Present Illness: Sarah Washington is a 35 year-old female who presents for hospital discharge follow-up.   HOSPITAL DISCHARGE FOLLOW-UP: Visit 11/17/2020 at [facility] Emergency Department per MD note: 35 y.o. female presents with hx of TTP who presents to ED today for left-sided abdominal pain beginning around 3PM today. Associated with hives and itching which did resolve with benadryl. Began shortly after eating - all foods were foods she had eaten in the past. Still having abdominal pain currently. No nausea or vomiting. No shortness of breath, facial / oral swelling.   Patient seen and received a tele-medical screening examination in triage. The provider performing the medical screening exam was not located at the facility and was located remotely. Patient understands that the provider is seeing them remotely and consents to the exam. Additionally, the patient has been advised of the risks and benefits of a video  visit that differ from in-person treatment such as: a limited physical examination, unforeseen disruptions to connectivity, risks to patient confidentiality and privacy. Patient was also explained the risks of the exam which include video or sound dysfunction, or minor portions of the exam deferred. Appropriate orders have been initiated based on my brief physical exam and HPI. Patient placed in appropriate area until a treatment room becomes available for further evaluation and management by the in-house provider.  35 year old female patient presents to the emergency department with complaint of left lower quadrant abdominal pain. She is really pointing in her left lower pelvis region. She also complains of rash with itching and hives. She has been taking Benadryl for the rash and hives. Her abdominal pain started today around 3 PM and continues. She denies hematuria. Denies any history of diverticulitis or kidney stones.  MDM  Based on HPI and physical exam, the following diagnostics were ordered:  Orders Placed This Encounter Culture, Urine Urine CT Abdomen Pelvis WO IV Contrast ([facility] Protocol), resulted normal CBC And Differential Comprehensive Metabolic Panel Lipase Urinalysis with Microscopy and Culture Urine HCG Inpatient Consult to Tele-Triage   Discharge Instructions: Drink plenty fluids over the next 48 hours.  Follow-up with ENT for allergy testing.  Follow-up with gastroenterology as needed.   Methylprednisolone prescribed.    11/19/2020: Reports feeling better without stomach pain. Reports Prednisone did help. LLQ abdominal pain: no  Nausea/vomiting: no  Eating and drinking as normal: yes  Blood in urine: no Shortness of breath: no  Chest pain: no  Rash: reports rash initially itching and broke out in hives on the entire back side of her body and bilateral palms for about 1 hour. Then took liquid Benadryl and  symptoms went away the same day. Denies changing any soaps,  detergents, perfumes, or any new foods.   2. FATIGUE FOLLOW-UP: 09/20/2020 per DO note: Patient with chronic iron deficiency anemia, followed by hematology. Last HgB stable and within range. PHQ-9 score concerning for mild depression; however, most of her score is related to fatigue and sleep disturbances. Will monitor other labs for etiologies of fatigue. Given feeling poorly rested after adequate amount of sleep, BMI> 30, and morning headaches warrants work up for OSA. - TSH - VITAMIN D 25 Hydroxy (Vit-D Deficiency, Fractures) - Vitamin B12 - Split night study; Future  11/22/2020 Patient reports she has not heard from sleep study referral as of present. Still feeling tired and having headaches. Endorses snoring and waking up feeling tired. Sometimes feeling like she cant catch breath when laying down in bed. Fluffing pillows help.     Past Medical History:  Diagnosis Date   Anxiety and depression    Normocytic anemia    TTP (thrombotic thrombocytopenic purpura)    Allergies  Allergen Reactions   Sulfa Antibiotics     Bactrim    Current Outpatient Medications on File Prior to Visit  Medication Sig Dispense Refill   amLODipine (NORVASC) 2.5 MG tablet Take 1 tablet (2.5 mg total) by mouth daily. 90 tablet 3   cholecalciferol (VITAMIN D3) 25 MCG (1000 UNIT) tablet Take 1,000 Units by mouth daily.     hydrochlorothiazide (HYDRODIURIL) 25 MG tablet Take 1 tablet (25 mg total) by mouth daily. 90 tablet 3   iron polysaccharides (NIFEREX) 150 MG capsule Take 1 capsule (150 mg total) by mouth 2 (two) times daily. 60 capsule 3   Multiple Vitamin (MULTIVITAMIN WITH MINERALS) TABS tablet Take 1 tablet by mouth daily.     pantoprazole (PROTONIX) 40 MG tablet Take 1 tablet (40 mg total) by mouth daily as needed (GERD). 30 tablet 1   tranexamic acid (LYSTEDA) 650 MG TABS tablet Take 2 tablets (1,300 mg total) by mouth 3 (three) times daily. Take during menses for a maximum of five days 30 tablet 2    vitamin B-12 (CYANOCOBALAMIN) 50 MCG tablet Take 50 mcg by mouth daily.     Vitamin D, Ergocalciferol, (DRISDOL) 1.25 MG (50000 UNIT) CAPS capsule Take 1 capsule (50,000 Units total) by mouth every 7 (seven) days. 12 capsule 0   zinc gluconate 50 MG tablet Take 50 mg by mouth daily.     No current facility-administered medications on file prior to visit.    Observations/Objective: Alert and oriented x 3. Not in acute distress. Physical examination not completed as this is a telemedicine visit.  Assessment and Plan: 1. Hospital discharge follow-up: - Reviewed hospital course, current medications, ensured proper follow-up in place, and addressed concerns.   2. LLQ abdominal pain: - Currently resolved.  - CT Abdomen Pelvis WO IV Contrast ([facility] Protocol), resulted normal on 11/17/2020. - Referral to Gastroenterology for further evaluation and management.  - Ambulatory referral to Gastroenterology  3. Rash and nonspecific skin eruption: 4. Hives: - Patient reports rash initially itching and broke out in hives on the entire back side of her body and bilateral palms for about 1 hour. Then took liquid Benadryl and symptoms resolved the same day. Denies changing any soaps, detergents, perfumes, or any new foods.  - Referral to Allergy for further evaluation and management.  - Ambulatory referral to Allergy  5. Fatigue, unspecified type: - Patient reports she has not heard from sleep study referral as of present -  Continue with plan from 09/20/2020.  - [facility] Sleep Study for further evaluation and management.  - [facility] Sleep Study; Future   Follow Up Instructions: Follow-up with primary provider as scheduled.   Patient was given clear instructions to go to Emergency Department or return to medical center if symptoms don't improve, worsen, or new problems develop.The patient verbalized understanding.  I discussed the assessment and treatment plan with the patient. The patient was provided an  opportunity to ask questions and all were answered. The patient agreed with the plan and demonstrated an understanding of the instructions.   The patient was advised to call back or seek an in-person evaluation if the symptoms worsen or if the condition fails to improve as anticipated.    I provided 20 minutes total of non-face-to-face time during this encounter.   Camillia Herter, NP  [facility] [place], [place] 11/22/2020, 8:41 AM

## 2020-12-13 ENCOUNTER — Other Ambulatory Visit: Payer: Self-pay | Admitting: Hematology

## 2020-12-13 DIAGNOSIS — M3119 Other thrombotic microangiopathy: Secondary | ICD-10-CM

## 2021-01-18 ENCOUNTER — Other Ambulatory Visit: Payer: Self-pay

## 2021-01-18 ENCOUNTER — Ambulatory Visit (INDEPENDENT_AMBULATORY_CARE_PROVIDER_SITE_OTHER): Payer: Medicaid Other | Admitting: Allergy and Immunology

## 2021-01-18 ENCOUNTER — Encounter: Payer: Self-pay | Admitting: Allergy and Immunology

## 2021-01-18 VITALS — BP 128/78 | HR 100 | Temp 98.1°F | Resp 16 | Ht 67.0 in | Wt 243.4 lb

## 2021-01-18 DIAGNOSIS — L500 Allergic urticaria: Secondary | ICD-10-CM | POA: Diagnosis not present

## 2021-01-18 DIAGNOSIS — T7840XD Allergy, unspecified, subsequent encounter: Secondary | ICD-10-CM | POA: Diagnosis not present

## 2021-01-18 DIAGNOSIS — T782XXD Anaphylactic shock, unspecified, subsequent encounter: Secondary | ICD-10-CM

## 2021-01-18 MED ORDER — EPINEPHRINE 0.3 MG/0.3ML IJ SOAJ: 0.3000 mg | Freq: Once | INTRAMUSCULAR | 1 refills | Status: AC

## 2021-01-18 NOTE — Progress Notes (Signed)
[facility] [place]   Dear Durene Fruits,  Thank you for referring Sarah [place] to the [facility] on 01/18/2021.   Below is a summation of this patient's evaluation and recommendations.  Thank you for your referral. I will keep you informed about this patient's response to treatment.   If you have any questions please do not hesitate to contact me.   Sincerely,  Jiles Prows, MD Allergy / Immunology [place]   ______________________________________________________________________    NEW PATIENT NOTE  Referring Provider: Camillia Herter, NP Primary Provider: Nicolette Bang, MD Date of office visit: 01/18/2021    Subjective:   Chief Complaint:  Sarah [place] (DOB: September 07, 1985) is a 35 y.o. female who presents to the clinic on 01/18/2021 with a chief complaint of Urticaria (Says it started one day with itching hands and feet. Bathing didn't help. Says she had hives all over her body when she got out the shower. Says this is the second episode with no life style changes.) .     HPI: Devonne presents to this clinic in evaluation of an allergic reaction that developed in June 2022.  Apparently she woke up at nighttime with diffuse itchiness which quickly progressed into hives in association with abdominal pain and cramping without any nausea or vomiting or diarrhea or other associated systemic or constitutional symptoms in June 2022.  She had approximately 6 hours of this episode.  During that timeframe she took a Benadryl and she went to the emergency room and apparently she was given some prednisone.  Her reaction has not been recurrent.  There was no obvious provoking factor giving rise to that issue.  She did not start any new medications or supplements or health foods or over-the-counter medications.  She did not  have symptoms suggesting a infectious disease either prior to or after this reaction.  She did not have a significant environmental change.  She does not really have a strong atopic history.  Currently she did have a cutaneous reaction to Bactrim that she was taking for her hidradenitis suppurativa but she is no longer on Bactrim.  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  Past Medical History:  Diagnosis Date   Anxiety and depression    Normocytic anemia    TTP (thrombotic thrombocytopenic purpura)     Past Surgical History:  Procedure Laterality Date   TUBAL LIGATION      Allergies as of 01/18/2021       Reactions   Sulfa Antibiotics Hives   Bactrim        Medication List    amLODipine 2.5 MG tablet Commonly known as: NORVASC Take 1 tablet (2.5 mg total) by mouth daily.   cholecalciferol 25 MCG (1000 UNIT) tablet Commonly known as: VITAMIN D3 Take 1,000 Units by mouth daily.   hydrochlorothiazide 25 MG tablet Commonly known as: HYDRODIURIL Take 1 tablet (25 mg total) by mouth daily.   iron polysaccharides 150 MG capsule Commonly known as: NIFEREX Take 1 capsule (150 mg total) by mouth 2 (two) times daily.   multivitamin with minerals Tabs tablet Take 1 tablet by mouth daily.   pantoprazole 40 MG tablet Commonly known as: PROTONIX TAKE 1 TABLET(40 MG) BY MOUTH DAILY AS NEEDED FOR GERD   vitamin B-12 50 MCG tablet Commonly known  as: CYANOCOBALAMIN Take 50 mcg by mouth daily.   Vitamin D (Ergocalciferol) 1.25 MG (50000 UNIT) Caps capsule Commonly known as: DRISDOL Take 1 capsule (50,000 Units total) by mouth every 7 (seven) days.   zinc gluconate 50 MG tablet Take 50 mg by mouth daily.        Review of systems negative except as noted in HPI / PMHx or noted below:  Review of Systems  Constitutional: Negative.   HENT: Negative.    Eyes: Negative.   Respiratory: Negative.     Cardiovascular: Negative.   Gastrointestinal: Negative.   Genitourinary: Negative.   Musculoskeletal: Negative.   Skin: Negative.   Neurological: Negative.   Endo/Heme/Allergies: Negative.   Psychiatric/Behavioral: Negative.     Family History  Problem Relation Age of Onset   Hypertension Mother    Diabetes Mother    Diabetes Father    Hypertension Father    Mental illness Father    Arthritis Maternal Grandmother    Cancer Maternal Grandmother    Diabetes Maternal Grandmother    Hypertension Maternal Grandmother    Stroke Maternal Grandmother    Breast cancer Maternal Grandmother    Stroke Maternal Grandfather    Arthritis Paternal Grandmother    Cancer Paternal Grandmother    Diabetes Paternal Grandmother    Hypertension Paternal Grandmother    Breast cancer Paternal Grandmother     Social History   Socioeconomic History   Marital status: Single    Spouse name: Not on file   Number of children: 3   Years of education: Not on file   Highest education level: Not on file  Occupational History   Not on file  Tobacco Use   Smoking status: Some Days    Types: Cigarettes    Last attempt to quit: 10/2017    Years since quitting: 3.2   Smokeless tobacco: Never  Vaping Use   Vaping Use: Never used  Substance and Sexual Activity   Alcohol use: Yes   Drug use: Never   Sexual activity: Yes    Birth control/protection: Surgical  Other Topics Concern   Not on file  Social History Narrative   Not on file   Environmental and Social history  Lives in a house with a dry [place], dog look inside the household, carpet in the bedroom, no plastic on the bed, no plastic on the pillow, and employment as a Haematologist.  Objective:   Vitals:   01/18/21 1401  BP: 128/78  Pulse: 100  Resp: 16  Temp: 98.1 F (36.7 C)  SpO2: 98%   Height: '5\' 7"'$  (170.2 cm) Weight: 243 lb 6.4 oz (110.4 kg)  Physical Exam Constitutional:      Appearance: She is not diaphoretic.  HENT:      Head: Normocephalic.     Right Ear: Tympanic membrane, ear canal and external ear normal.     Left Ear: Tympanic membrane, ear canal and external ear normal.     Nose: Nose normal. No mucosal edema or rhinorrhea.     Mouth/Throat:     Pharynx: Uvula midline. No oropharyngeal exudate.  Eyes:     Conjunctiva/sclera: Conjunctivae normal.  Neck:     Thyroid: No thyromegaly.     Trachea: Trachea normal. No tracheal tenderness or tracheal deviation.  Cardiovascular:     Rate and Rhythm: Normal rate and regular rhythm.     Heart sounds: Normal heart sounds, S1 normal and S2 normal. No murmur heard. Pulmonary:     Effort:  No respiratory distress.     Breath sounds: Normal breath sounds. No stridor. No wheezing or rales.  Lymphadenopathy:     Head:     Right side of head: No tonsillar adenopathy.     Left side of head: No tonsillar adenopathy.     Cervical: No cervical adenopathy.  Skin:    Findings: No erythema or rash.     Nails: There is no clubbing.  Neurological:     Mental Status: She is alert.    Diagnostics: Allergy skin tests were performed.  She demonstrated slight hypersensitivity against tree nuts including cashew and pecan.  All other skin test results directed against a screening panel of foods were negative.  Review of blood tests obtained 13 Oct 2020 identified WBC 10.7, absolute eosinophil 100, absolute basophil 0, absolute lymphocyte 4600, hemoglobin 11.9, platelet 314, creatinine 1.06 mg/DL, AST 12 U/L, ALT 12 U/L.  Results of blood tests obtained 17 November 2020 identifies WBC 12.8, absolute eosinophils 0, absolute basophil 0, absolute lymphocyte 3400, hemoglobin 13.0, platelet 369.  Assessment and Plan:    1. Allergic reaction, subsequent encounter   2. Anaphylaxis, subsequent encounter   3. Allergic urticaria     1.  Allergen avoidance measures?  2.  Blood - alpha gal panel, nut panel with reflex  3.  Further evaluation???  Yes, if recurrent  reactions  4.  EpiPen, Benadryl, MD/ER evaluation for allergic reaction  Amanah had a acute allergic reaction involving 2 organ systems including skin and gut and we had a long talk with her today about the need to recognize and quickly treat an allergic reaction with injectable epinephrine should it happen in the future.  She did demonstrate some hypersensitivity against tree nuts during today's evaluation and we will further evaluate that issue by looking at component testing and also rule out alpha gal syndrome with the blood test noted above.  I will contact her with the results of her blood test once they are available for review.  Jiles Prows, MD Allergy / Immunology [facility]

## 2021-01-18 NOTE — Patient Instructions (Addendum)
  1.  Allergen avoidance measures?  2.  Blood - alpha gal panel, nut panel with reflex  3.  Further evaluation???  Yes, if recurrent reactions  4.  EpiPen, Benadryl, MD/ER evaluation for allergic reaction

## 2021-01-19 ENCOUNTER — Other Ambulatory Visit: Payer: Self-pay

## 2021-01-19 ENCOUNTER — Encounter: Payer: Self-pay | Admitting: Allergy and Immunology

## 2021-01-19 MED ORDER — EPINEPHRINE 0.3 MG/0.3ML IJ SOAJ: 0.3000 mg | Freq: Once | INTRAMUSCULAR | 1 refills | Status: AC

## 2021-01-20 ENCOUNTER — Other Ambulatory Visit: Payer: Self-pay | Admitting: *Deleted

## 2021-01-20 MED ORDER — EPINEPHRINE 0.3 MG/0.3ML IJ SOAJ: 0.3000 mg | Freq: Once | INTRAMUSCULAR | 1 refills | Status: AC

## 2021-01-22 LAB — ALPHA-GAL PANEL
Allergen Lamb IgE: <0.1 kU/L
Beef IgE: <0.1 kU/L
IgE (Immunoglobulin E), Serum: 46 IU/mL (ref 6–495)
O215-IgE Alpha-Gal: <0.1 kU/L
Pork IgE: <0.1 kU/L

## 2021-01-22 LAB — IGE NUT PROF. W/COMPONENT RFLX
F017-IgE Hazelnut (Filbert): <0.1 kU/L
F018-IgE Brazil Nut: <0.1 kU/L
F020-IgE Almond: <0.1 kU/L
F202-IgE Cashew Nut: <0.1 kU/L
F203-IgE Pistachio Nut: 0.12 kU/L — AB
F256-IgE Walnut: <0.1 kU/L
Macadamia Nut, IgE: <0.1 kU/L
Peanut, IgE: <0.1 kU/L
Pecan Nut IgE: <0.1 kU/L

## 2021-01-25 ENCOUNTER — Other Ambulatory Visit: Payer: Self-pay | Admitting: *Deleted

## 2021-01-25 MED ORDER — EPINEPHRINE 0.3 MG/0.3ML IJ SOAJ: 0.3000 mg | Freq: Once | INTRAMUSCULAR | 1 refills | Status: AC

## 2021-01-27 ENCOUNTER — Telehealth: Payer: Self-pay | Admitting: *Deleted

## 2021-01-27 NOTE — Telephone Encounter (Signed)
PA has been submitted through CoverMyMeds For Epinephrine Pen and is currently pending approval/denial.

## 2021-01-27 NOTE — Telephone Encounter (Signed)
PA has been approved for EpiPen. PA has been faxed to patient's pharmacy, labeled, and placed in bulk scanning.

## 2021-02-15 ENCOUNTER — Other Ambulatory Visit: Payer: Self-pay | Admitting: Hematology

## 2021-02-15 DIAGNOSIS — M3119 Other thrombotic microangiopathy: Secondary | ICD-10-CM

## 2021-03-07 ENCOUNTER — Ambulatory Visit
Admission: RE | Admit: 2021-03-07 | Discharge: 2021-03-07 | Disposition: A | Discharge status: Home / Self Care | Payer: Medicaid Other | Source: Ambulatory Visit | Attending: Physician Assistant | Admitting: Physician Assistant

## 2021-03-07 ENCOUNTER — Other Ambulatory Visit: Payer: Self-pay | Admitting: Physician Assistant

## 2021-03-07 ENCOUNTER — Telehealth: Payer: Self-pay | Admitting: *Deleted

## 2021-03-07 ENCOUNTER — Other Ambulatory Visit: Payer: Self-pay

## 2021-03-07 DIAGNOSIS — N6489 Other specified disorders of breast: Secondary | ICD-10-CM

## 2021-03-07 DIAGNOSIS — N644 Mastodynia: Secondary | ICD-10-CM

## 2021-03-07 NOTE — Telephone Encounter (Signed)
-----   Message from Kennieth Rad, [place] sent at 03/07/2021  1:49 PM EDT ----- Please call the patient and let her know that her mammogram did recommend follow-up diagnostic in 6 months to ensure continued stability.  This was discussed with patient by radiologist

## 2021-03-07 NOTE — Telephone Encounter (Signed)
Patient verified DOB Patient is aware of scheduling a follow up diagnostic in 6 months to verify stability.

## 2021-04-14 ENCOUNTER — Other Ambulatory Visit: Payer: Self-pay

## 2021-04-14 DIAGNOSIS — M3119 Other thrombotic microangiopathy: Secondary | ICD-10-CM

## 2021-04-15 ENCOUNTER — Ambulatory Visit: Payer: Medicaid Other | Admitting: Hematology

## 2021-04-15 ENCOUNTER — Other Ambulatory Visit: Payer: Medicaid Other

## 2021-05-11 ENCOUNTER — Telehealth: Payer: Self-pay

## 2021-05-11 NOTE — Telephone Encounter (Signed)
[facility] called the lab test  IgE Nut Prof. w/Component Rflx was not covered by listed diagnosis code. I gave them the diagnosis code information attached to the visit. She also wrote in the reason for the code to help get it covered. The office visit was on 01/18/21.

## 2021-08-29 ENCOUNTER — Inpatient Hospital Stay: Payer: Medicaid Other | Attending: Hematology | Admitting: Hematology

## 2021-08-29 ENCOUNTER — Other Ambulatory Visit: Payer: Medicaid Other

## 2021-08-29 ENCOUNTER — Inpatient Hospital Stay: Payer: Medicaid Other

## 2021-08-29 ENCOUNTER — Other Ambulatory Visit: Payer: Self-pay

## 2021-08-29 VITALS — BP 124/86 | HR 96 | Temp 97.9°F | Resp 16 | Ht 67.0 in | Wt 231.3 lb

## 2021-08-29 DIAGNOSIS — D509 Iron deficiency anemia, unspecified: Secondary | ICD-10-CM | POA: Diagnosis not present

## 2021-08-29 DIAGNOSIS — Z79899 Other long term (current) drug therapy: Secondary | ICD-10-CM | POA: Insufficient documentation

## 2021-08-29 DIAGNOSIS — M3119 Other thrombotic microangiopathy: Secondary | ICD-10-CM | POA: Diagnosis not present

## 2021-08-29 DIAGNOSIS — Z634 Disappearance and death of family member: Secondary | ICD-10-CM | POA: Insufficient documentation

## 2021-08-29 DIAGNOSIS — F329 Major depressive disorder, single episode, unspecified: Secondary | ICD-10-CM | POA: Diagnosis not present

## 2021-08-29 DIAGNOSIS — M311 Thrombotic microangiopathy, unspecified: Secondary | ICD-10-CM | POA: Insufficient documentation

## 2021-08-29 LAB — CMP (CANCER CENTER ONLY)
ALT: 13 U/L (ref 0–44)
AST: 12 U/L — ABNORMAL LOW (ref 15–41)
Albumin: 3.9 g/dL (ref 3.5–5.0)
Alkaline Phosphatase: 56 U/L (ref 38–126)
Anion gap: 6 (ref 5–15)
BUN: 13 mg/dL (ref 6–20)
CO2: 26 mmol/L (ref 22–32)
Calcium: 9.2 mg/dL (ref 8.9–10.3)
Chloride: 104 mmol/L (ref 98–111)
Creatinine: 1.03 mg/dL — ABNORMAL HIGH (ref 0.44–1.00)
GFR, Estimated: >60 mL/min (ref >60)
Glucose, Bld: 89 mg/dL (ref 70–99)
Potassium: 3.6 mmol/L (ref 3.5–5.1)
Sodium: 136 mmol/L (ref 135–145)
Total Bilirubin: 0.2 mg/dL — ABNORMAL LOW (ref 0.3–1.2)
Total Protein: 8.2 g/dL — ABNORMAL HIGH (ref 6.5–8.1)

## 2021-08-29 LAB — IRON AND IRON BINDING CAPACITY (CC-WL,HP ONLY)
Iron: 18 ug/dL — ABNORMAL LOW (ref 28–170)
Saturation Ratios: 4 % — ABNORMAL LOW (ref 10.4–31.8)
TIBC: 409 ug/dL (ref 250–450)
UIBC: 391 ug/dL (ref 148–442)

## 2021-08-29 LAB — LACTATE DEHYDROGENASE: LDH: 120 U/L (ref 98–192)

## 2021-08-29 LAB — VITAMIN B12: Vitamin B-12: 371 pg/mL (ref 180–914)

## 2021-08-30 ENCOUNTER — Inpatient Hospital Stay: Payer: Medicaid Other

## 2021-08-30 ENCOUNTER — Other Ambulatory Visit: Payer: Medicaid Other

## 2021-08-30 DIAGNOSIS — M3119 Other thrombotic microangiopathy: Secondary | ICD-10-CM

## 2021-08-30 DIAGNOSIS — M311 Thrombotic microangiopathy, unspecified: Secondary | ICD-10-CM | POA: Diagnosis not present

## 2021-08-30 LAB — CBC WITH DIFFERENTIAL (CANCER CENTER ONLY)
Abs Immature Granulocytes: 0.01 10*3/uL (ref 0.00–0.07)
Basophils Absolute: 0 10*3/uL (ref 0.0–0.1)
Basophils Relative: 0 %
Eosinophils Absolute: 0.1 10*3/uL (ref 0.0–0.5)
Eosinophils Relative: 1 %
HCT: 34 % — ABNORMAL LOW (ref 36.0–46.0)
Hemoglobin: 10.9 g/dL — ABNORMAL LOW (ref 12.0–15.0)
Immature Granulocytes: 0 %
Lymphocytes Relative: 48 %
Lymphs Abs: 4 10*3/uL (ref 0.7–4.0)
MCH: 26.1 pg (ref 26.0–34.0)
MCHC: 32.1 g/dL (ref 30.0–36.0)
MCV: 81.5 fL (ref 80.0–100.0)
Monocytes Absolute: 0.5 10*3/uL (ref 0.1–1.0)
Monocytes Relative: 6 %
Neutro Abs: 3.7 10*3/uL (ref 1.7–7.7)
Neutrophils Relative %: 45 %
Platelet Count: 356 10*3/uL (ref 150–400)
RBC: 4.17 MIL/uL (ref 3.87–5.11)
RDW: 14.7 % (ref 11.5–15.5)
WBC Count: 8.3 10*3/uL (ref 4.0–10.5)
nRBC: 0 % (ref 0.0–0.2)

## 2021-08-30 LAB — CMP (CANCER CENTER ONLY)
ALT: 11 U/L (ref 0–44)
AST: 10 U/L — ABNORMAL LOW (ref 15–41)
Albumin: 3.5 g/dL (ref 3.5–5.0)
Alkaline Phosphatase: 49 U/L (ref 38–126)
Anion gap: 4 — ABNORMAL LOW (ref 5–15)
BUN: 16 mg/dL (ref 6–20)
CO2: 27 mmol/L (ref 22–32)
Calcium: 8.4 mg/dL — ABNORMAL LOW (ref 8.9–10.3)
Chloride: 106 mmol/L (ref 98–111)
Creatinine: 0.9 mg/dL (ref 0.44–1.00)
GFR, Estimated: >60 mL/min (ref >60)
Glucose, Bld: 91 mg/dL (ref 70–99)
Potassium: 3.8 mmol/L (ref 3.5–5.1)
Sodium: 137 mmol/L (ref 135–145)
Total Bilirubin: 0.3 mg/dL (ref 0.3–1.2)
Total Protein: 7.4 g/dL (ref 6.5–8.1)

## 2021-08-30 LAB — HAPTOGLOBIN: Haptoglobin: 243 mg/dL (ref 33–278)

## 2021-08-30 LAB — ADAMTS13 ACTIVITY REFLEX

## 2021-08-30 LAB — ADAMTS13 ACTIVITY: Adamts 13 Activity: 53 % — ABNORMAL LOW (ref >66.8)

## 2021-08-30 LAB — FERRITIN
Ferritin: 16 ng/mL (ref 11–307)
Ferritin: 15 ng/mL (ref 11–307)

## 2021-08-31 ENCOUNTER — Telehealth: Payer: Self-pay | Admitting: Hematology

## 2021-08-31 NOTE — Telephone Encounter (Signed)
Scheduled follow-up appointment per 3/27 los. Patient is aware. ?

## 2021-09-05 ENCOUNTER — Encounter: Payer: Self-pay | Admitting: Hematology

## 2021-09-05 NOTE — Progress Notes (Signed)
? ? ?HEMATOLOGY/ONCOLOGY CLINIC NOTE ? ?Date of Service: 08/29/2021 ? ? ?Patient Care Team: ?Nicolette Bang, MD as PCP - General (Family Medicine) ?Tobe Sos, PA-C as Consulting Physician (Dermatology) ? ?CHIEF COMPLAINTS/PURPOSE OF CONSULTATION:  ?Follow-up for continued evaluation and management of ? ?HISTORY OF PRESENTING ILLNESS:  ? ?Sarah [place] is a wonderful 36 y.o. female who has been referred to [place] by Dr Mabeline Caras for evaluation and management of recently diagnosed acquired TTP/Thrombotic microangiopathy. She is accompanied today by her three children. The pt reports that she is doing well overall.  ? ?The pt reports that in January 2019 she began feeling weak, tired, and developed a newly heavy menstrual cycle. She also sites bruising easily that began occurring in January 2019 but denies nose bleeds, gum bleeds, and blood in the stools at the time. She notes that she first had labs to evaluate these symptoms ?She noted finger tip neuropathy, severe headache, abdominal pains, and tingling in her feet. She denies any infections at the time, PO contraceptives, hormone therapy, and new medications at the time. She also denies recent blood transfusions as well. She was discharged with Atovaquone (for PCP prophylaxis). 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 ? ?The pt presented to [facility] on 11/02/17 for TTP.  Her last plasma exchange was on 11/07/17. She received 5 total plasma exchanges. She received her third Rituxan infusion on 11/15/17, and only notes some mild itching but denies any rashes with her infusion. She has not yet received her fourth dose or Rituxan as she has moved from [facility] to [place]. She has been taking '30mg'$  Prednisone since 11/15/17.  She adds that she has gained 20 pounds of weight, noting that she has a  very strong appetite while taking steroids.  ? ? ?The pt has previously taken BP medication for her HTN. ? She received 5 units of blood, and no plasma exchange after delivery of her third child was premature at 5 months due to HELLP syndrome.  ? ?Most recent lab results (11/15/17) of CBC  is as follows: all values are WNL except for WBC at 12.4k, RBC at 3.69, HGB at 11.8, RDW at 22.3, MPV at 6.9, PLT at 137k. ? ?On review of systems, pt reports increased appetite, weight gain, normalized menstrual bleeding, and denies headaches, peripheral neuropathy, fevers, chills, night sweats, back pains, abdominal pains, changes in bowel habits, and any other symptoms.  ? ?On PMHx the pt reports HTN, tubal ligation in 2010, HELLP syndrome with third pregnancy requiring delivery after 5 months. Angioedema reaction to sulfa drugs.  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  ?On Family Hx the pt denies any blood, cancer, or autoimmune disorders. ? ? ?INTERVAL HISTORY:   ? ?Ree Shay is here for continued evaluation and management of her TTP. ?Her last clinic visit was more than 6 months ago and this follow-up was delayed on account of multiple stressful events in the patient's life. ?Her 61 year old son was shot and killed at the playground by another 69 year old child. ?She notes that her daughter has been very depressed and has needed inpatient behavioral health care and her older son is also having significant issues with managing his emotions understandably. ?Patient notes that she has not had a chance to emotionally express her pain and was quite tearful in clinic today.  She notes  that she has felt better and this is the first time she has been able to freely express her emotions. ?She is willing to get a referral to see psychiatry to address her significant emotional pain for counseling and medications if needed. ?Physically she notes no acute new symptoms though she notes that her emotional pain sometimes  makes her feel nauseous. ? ?Labs done today with were reviewed with her in detail. ? ?MEDICAL HISTORY:  ?HTN, tubal ligation in 2010, HELLP syndrome with third pregnancy requiring delivery after 5 months  ? ? ?SURGICAL HISTORY: ?Past Surgical History:  ?Procedure Laterality Date  ? TUBAL LIGATION    ?tubal ligation in 2010 ? ?SOCIAL HISTORY: ?Social History  ? ?Socioeconomic History  ? Marital status: Single  ?  Spouse name: Not on file  ? Number of children: 3  ? Years of education: Not on file  ? Highest education level: Not on file  ?Occupational History  ? Not on file  ?Tobacco Use  ? Smoking status: Some Days  ?  Types: Cigarettes  ?  Last attempt to quit: 10/2017  ?  Years since quitting: 3.9  ? Smokeless tobacco: Never  ?Vaping Use  ? Vaping Use: Never used  ?Substance and Sexual Activity  ? Alcohol use: Yes  ? Drug use: Never  ? Sexual activity: Yes  ?  Birth control/protection: Surgical  ?Other Topics Concern  ? Not on file  ?Social History Narrative  ? Not on file  ? ?Social Determinants of Health  ? ?Financial Resource Strain: Not on file  ?Food Insecurity: Not on file  ?Transportation Needs: Not on file  ?Physical Activity: Not on file  ?Stress: Not on file  ?Social Connections: Not on file  ?Intimate Partner Violence: Not on file  ? ? ?FAMILY HISTORY: ?Family History  ?Problem Relation Age of Onset  ? Hypertension Mother   ? Diabetes Mother   ? Diabetes Father   ? Hypertension Father   ? Mental illness Father   ? Arthritis Maternal Grandmother   ? Cancer Maternal Grandmother   ? Diabetes Maternal Grandmother   ? Hypertension Maternal Grandmother   ? Stroke Maternal Grandmother   ? Breast cancer Maternal Grandmother   ? Stroke Maternal Grandfather   ? Arthritis Paternal Grandmother   ? Cancer Paternal Grandmother   ? Diabetes Paternal Grandmother   ? Hypertension Paternal Grandmother   ? Breast cancer Paternal Grandmother   ? ? ?ALLERGIES:  is allergic to sulfa antibiotics. ? ?MEDICATIONS:  ?Current  Outpatient Medications  ?Medication Sig Dispense Refill  ? amLODipine (NORVASC) 2.5 MG tablet Take 1 tablet (2.5 mg total) by mouth daily. 90 tablet 3  ? cholecalciferol (VITAMIN D3) 25 MCG (1000 UNIT) tablet Take 1,000 Units by mouth daily.    ? hydrochlorothiazide (HYDRODIURIL) 25 MG tablet Take 1 tablet (25 mg total) by mouth daily. 90 tablet 3  ? iron polysaccharides (NIFEREX) 150 MG capsule Take 1 capsule (150 mg total) by mouth 2 (two) times daily. 60 capsule 3  ? Multiple Vitamin (MULTIVITAMIN WITH MINERALS) TABS tablet Take 1 tablet by mouth daily.    ? pantoprazole (PROTONIX) 40 MG tablet TAKE 1 TABLET(40 MG) BY MOUTH DAILY AS NEEDED FOR GERD 30 tablet 1  ? vitamin B-12 (CYANOCOBALAMIN) 50 MCG tablet Take 50 mcg by mouth daily.    ? Vitamin D, Ergocalciferol, (DRISDOL) 1.25 MG (50000 UNIT) CAPS capsule Take 1 capsule (50,000 Units total) by mouth every 7 (seven) days. 12 capsule 0  ?  zinc gluconate 50 MG tablet Take 50 mg by mouth daily.    ? ?No current facility-administered medications for this visit.  ? ? ?REVIEW OF SYSTEMS:   ?10 Point review of Systems was done is negative except as noted above. ? ?PHYSICAL EXAMINATION: ?ECOG FS:1 - Symptomatic but completely ambulatory ? ?Vitals:  ? 08/29/21 1407  ?BP: 124/86  ?Pulse: 96  ?Resp: 16  ?Temp: 97.9 ?F (36.6 ?C)  ?SpO2: 99%  ? ?Wt Readings from Last 3 Encounters:  ?08/29/21 231 lb 4.8 oz (104.9 kg)  ?01/18/21 243 lb 6.4 oz (110.4 kg)  ?10/13/20 243 lb 8 oz (110.5 kg)  ? ?Body mass index is 36.23 kg/m?Marland Kitchen   ?NAD ?GENERAL:alert, in no acute distress and comfortable ?SKIN: no acute rashes, no significant lesions ?EYES: conjunctiva are pink and non-injected, sclera anicteric ?OROPHARYNX: MMM, no exudates, no oropharyngeal erythema or ulceration ?NECK: supple, no JVD ?LYMPH:  no palpable lymphadenopathy in the cervical, axillary or inguinal regions ?LUNGS: clear to auscultation b/l with normal respiratory effort ?HEART: regular rate & rhythm ?ABDOMEN:   normoactive bowel sounds , non tender, not distended. ?Extremity: no pedal edema ?PSYCH: alert & oriented x 3 with fluent speech ?NEURO: no focal motor/sensory deficits ? ?LABORATORY DATA:  ?I have reviewed the data

## 2021-09-06 ENCOUNTER — Inpatient Hospital Stay: Payer: Medicaid Other | Attending: Hematology | Admitting: Hematology

## 2021-09-06 ENCOUNTER — Other Ambulatory Visit: Payer: Self-pay | Admitting: Physician Assistant

## 2021-09-06 ENCOUNTER — Encounter: Payer: Self-pay | Admitting: Licensed Clinical Social Worker

## 2021-09-06 ENCOUNTER — Ambulatory Visit
Admission: RE | Admit: 2021-09-06 | Discharge: 2021-09-06 | Disposition: A | Discharge status: Home / Self Care | Payer: Medicaid Other | Source: Ambulatory Visit | Attending: Physician Assistant | Admitting: Physician Assistant

## 2021-09-06 ENCOUNTER — Other Ambulatory Visit: Payer: Self-pay

## 2021-09-06 DIAGNOSIS — M3119 Other thrombotic microangiopathy: Secondary | ICD-10-CM

## 2021-09-06 DIAGNOSIS — D509 Iron deficiency anemia, unspecified: Secondary | ICD-10-CM | POA: Diagnosis not present

## 2021-09-06 DIAGNOSIS — N6489 Other specified disorders of breast: Secondary | ICD-10-CM

## 2021-09-06 NOTE — Progress Notes (Signed)
[place] ?Clinical Social Work ? ?Clinical Social Work was referred by medical provider for support for traumatic loss of son.  Clinical Social Worker contacted patient by phone  to offer support and assess for needs.   ?Patient is interested in counseling support after her son was shot and killed. She has two other children (boy and girl) and has already connected her daughter with services as she was struggling.  CSW reviewed options of grief counseling through [facility] or going to a community provider. Pt opted to start with [facility] and will contact this CSW if she needs other options. ? ? ? ? ?Sarah Kempner E Honesti Seaberg, LCSW  ?Clinical Social Worker ?[place] ?      ? ?

## 2021-09-06 NOTE — Progress Notes (Signed)
? ? ?HEMATOLOGY/ONCOLOGY PHONE VISIT NOTE ? ?Date of Service: 09/06/2021 ? ? ?Patient Care Team: ?Nicolette Bang, MD as PCP - General (Family Medicine) ?Tobe Sos, PA-C as Consulting Physician (Dermatology) ? ?CHIEF COMPLAINTS/PURPOSE OF CONSULTATION:  ?Follow-up for continued evaluation and management of TTP/Thrombotic microangiopathy ? ?HISTORY OF PRESENTING ILLNESS:  ? ?Sarah Washington is a wonderful 36 y.o. female who has been referred to [place] by Dr Mabeline Caras for evaluation and management of recently diagnosed acquired TTP/Thrombotic microangiopathy. She is accompanied today by her three children. The pt reports that she is doing well overall.  ? ?The pt reports that in January 2019 she began feeling weak, tired, and developed a newly heavy menstrual cycle. She also sites bruising easily that began occurring in January 2019 but denies nose bleeds, gum bleeds, and blood in the stools at the time. She notes that she first had labs to evaluate these symptoms ?She noted finger tip neuropathy, severe headache, abdominal pains, and tingling in her feet. She denies any infections at the time, PO contraceptives, hormone therapy, and new medications at the time. She also denies recent blood transfusions as well. She was discharged with Atovaquone (for PCP prophylaxis). 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 ? ?The pt presented to [facility] on 11/02/17 for TTP.  Her last plasma exchange was on 11/07/17. She received 5 total plasma exchanges. She received her third Rituxan infusion on 11/15/17, and only notes some mild itching but denies any rashes with her infusion. She has not yet received her fourth dose or Rituxan as she has moved from [facility] to [place]. She has been taking '30mg'$  Prednisone since 11/15/17.  She adds that she has gained 20 pounds  of weight, noting that she has a very strong appetite while taking steroids.  ? ? ?The pt has previously taken BP medication for her HTN. ? She received 5 units of blood, and no plasma exchange after delivery of her third child was premature at 5 months due to HELLP syndrome.  ? ?Most recent lab results (11/15/17) of CBC  is as follows: all values are WNL except for WBC at 12.4k, RBC at 3.69, HGB at 11.8, RDW at 22.3, MPV at 6.9, PLT at 137k. ? ?On review of systems, pt reports increased appetite, weight gain, normalized menstrual bleeding, and denies headaches, peripheral neuropathy, fevers, chills, night sweats, back pains, abdominal pains, changes in bowel habits, and any other symptoms.  ? ?On PMHx the pt reports HTN, tubal ligation in 2010, HELLP syndrome with third pregnancy requiring delivery after 5 months. Angioedema reaction to sulfa drugs.  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  ?On Family Hx the pt denies any blood, cancer, or autoimmune disorders. ? ? ?INTERVAL HISTORY:   ?I connected with Ree Shay on 09/06/2021 at 8:45AM EST by telephone visit and verified that I am speaking with the correct person using two identifiers.  ? ?I discussed the limitations, risks, security and privacy concerns of performing an evaluation and management service by telemedicine and the availability of in-person appointments. I also discussed with the patient that there may be a patient responsible charge related to this service. The patient expressed understanding and agreed to proceed.  ? ?Other persons participating in the visit and their role in the encounter: None ? ?Patient?s location: Home ?Provider?s location: [place] ? ?  I connected with Ree Shay via telephone visit for continued evaluation and management of her TTP. Her last clinic visit was 08/29/2021. ? ?She is willing to get a referral to see psychiatry to address her significant emotional pain for counseling  and medications if needed. ? ?Physically she notes no acute new symptoms though she notes that her emotional pain sometimes makes her feel nauseous. ? ?Labs done 08/30/2021 with were reviewed with her in detail. ?CBC stable with decreased hemoglobin at 10.9. ?CMP stable with mild hypocalcemia with Calcium at 8.4. AST decreased at 10. ? ?MEDICAL HISTORY:  ?HTN, tubal ligation in 2010, HELLP syndrome with third pregnancy requiring delivery after 5 months  ? ? ?SURGICAL HISTORY: ?Past Surgical History:  ?Procedure Laterality Date  ? TUBAL LIGATION    ?tubal ligation in 2010 ? ?SOCIAL HISTORY: ?Social History  ? ?Socioeconomic History  ? Marital status: Single  ?  Spouse name: Not on file  ? Number of children: 3  ? Years of education: Not on file  ? Highest education level: Not on file  ?Occupational History  ? Not on file  ?Tobacco Use  ? Smoking status: Some Days  ?  Types: Cigarettes  ?  Last attempt to quit: 10/2017  ?  Years since quitting: 3.9  ? Smokeless tobacco: Never  ?Vaping Use  ? Vaping Use: Never used  ?Substance and Sexual Activity  ? Alcohol use: Yes  ? Drug use: Never  ? Sexual activity: Yes  ?  Birth control/protection: Surgical  ?Other Topics Concern  ? Not on file  ?Social History Narrative  ? Not on file  ? ?Social Determinants of Health  ? ?Financial Resource Strain: Not on file  ?Food Insecurity: Not on file  ?Transportation Needs: Not on file  ?Physical Activity: Not on file  ?Stress: Not on file  ?Social Connections: Not on file  ?Intimate Partner Violence: Not on file  ? ? ?FAMILY HISTORY: ?Family History  ?Problem Relation Age of Onset  ? Hypertension Mother   ? Diabetes Mother   ? Diabetes Father   ? Hypertension Father   ? Mental illness Father   ? Arthritis Maternal Grandmother   ? Cancer Maternal Grandmother   ? Diabetes Maternal Grandmother   ? Hypertension Maternal Grandmother   ? Stroke Maternal Grandmother   ? Breast cancer Maternal Grandmother   ? Stroke Maternal Grandfather   ?  Arthritis Paternal Grandmother   ? Cancer Paternal Grandmother   ? Diabetes Paternal Grandmother   ? Hypertension Paternal Grandmother   ? Breast cancer Paternal Grandmother   ? ? ?ALLERGIES:  is allergic to sulfa antibiotics. ? ?MEDICATIONS:  ?Current Outpatient Medications  ?Medication Sig Dispense Refill  ? amLODipine (NORVASC) 2.5 MG tablet Take 1 tablet (2.5 mg total) by mouth daily. 90 tablet 3  ? cholecalciferol (VITAMIN D3) 25 MCG (1000 UNIT) tablet Take 1,000 Units by mouth daily.    ? hydrochlorothiazide (HYDRODIURIL) 25 MG tablet Take 1 tablet (25 mg total) by mouth daily. 90 tablet 3  ? iron polysaccharides (NIFEREX) 150 MG capsule Take 1 capsule (150 mg total) by mouth 2 (two) times daily. 60 capsule 3  ? Multiple Vitamin (MULTIVITAMIN WITH MINERALS) TABS tablet Take 1 tablet by mouth daily.    ? pantoprazole (PROTONIX) 40 MG tablet TAKE 1 TABLET(40 MG) BY MOUTH DAILY AS NEEDED FOR GERD 30 tablet 1  ? vitamin B-12 (CYANOCOBALAMIN) 50 MCG tablet Take 50 mcg by mouth daily.    ? Vitamin  D, Ergocalciferol, (DRISDOL) 1.25 MG (50000 UNIT) CAPS capsule Take 1 capsule (50,000 Units total) by mouth every 7 (seven) days. 12 capsule 0  ? zinc gluconate 50 MG tablet Take 50 mg by mouth daily.    ? ?No current facility-administered medications for this visit.  ? ? ?REVIEW OF SYSTEMS:   ?10 Point review of Systems was done is negative except as noted above. ? ?PHYSICAL EXAMINATION: ?ECOG FS:1 - Symptomatic but completely ambulatory ? ?There were no vitals filed for this visit. ? ?Wt Readings from Last 3 Encounters:  ?08/29/21 231 lb 4.8 oz (104.9 kg)  ?01/18/21 243 lb 6.4 oz (110.4 kg)  ?10/13/20 243 lb 8 oz (110.5 kg)  ? ?There is no height or weight on file to calculate BMI.   ? ?Telemedicine appointment ? ?LABORATORY DATA:  ?I have reviewed the data as listed ? ? ? ? ?11/15/17 ? ? ? ? ? ?. ? ?  Latest Ref Rng & Units 08/30/2021  ?  9:49 AM 10/13/2020  ?  1:59 PM 07/20/2020  ?  5:13 PM  ?CBC  ?WBC 4.0 - 10.5 K/uL 8.3    10.7   12.9    ?Hemoglobin 12.0 - 15.0 g/dL 10.9   11.9   12.7    ?Hematocrit 36.0 - 46.0 % 34.0   36.1   37.4    ?Platelets 150 - 400 K/uL 356   314   389    ? ? ?. ? ?  Latest Ref Rng & Units 08/30/2021

## 2021-09-12 ENCOUNTER — Telehealth: Payer: Self-pay | Admitting: *Deleted

## 2021-09-12 ENCOUNTER — Encounter: Payer: Self-pay | Admitting: Hematology

## 2021-09-12 NOTE — Telephone Encounter (Signed)
Patient verified DOB ?Patient is aware of asymmetry being benign and recommended to complete another mammogram in 1 year. ?

## 2021-09-12 NOTE — Telephone Encounter (Signed)
-----   Message from Kennieth Rad, PA-C sent at 09/06/2021  2:27 PM EDT ----- ?Please call patient and let her know that her mammogram did show stable probably benign asymmetry in the right breast.  They did recommend that she have a follow-up diagnostic mammogram in 12 months. ?

## 2021-09-13 ENCOUNTER — Telehealth: Payer: Self-pay | Admitting: Hematology

## 2021-09-13 NOTE — Telephone Encounter (Signed)
.  Called patient to schedule appointment per 4/11 inbasket, patient is aware of date and time.   ?

## 2021-09-21 ENCOUNTER — Inpatient Hospital Stay: Payer: Medicaid Other

## 2021-09-21 ENCOUNTER — Other Ambulatory Visit: Payer: Self-pay

## 2021-09-21 VITALS — BP 135/99 | HR 83 | Temp 98.7°F | Resp 17

## 2021-09-21 DIAGNOSIS — D509 Iron deficiency anemia, unspecified: Secondary | ICD-10-CM

## 2021-09-21 MED ORDER — ACETAMINOPHEN 325 MG PO TABS
650.0000 mg | ORAL_TABLET | Freq: Once | ORAL | Status: AC
  Administered 2021-09-21: 650 mg via ORAL
  Filled 2021-09-21: qty 2

## 2021-09-21 MED ORDER — SODIUM CHLORIDE 0.9 % IV SOLN
300.0000 mg | Freq: Once | INTRAVENOUS | Status: AC
  Administered 2021-09-21: 300 mg via INTRAVENOUS
  Filled 2021-09-21: qty 300

## 2021-09-21 MED ORDER — SODIUM CHLORIDE 0.9 % IV SOLN: Freq: Once | INTRAVENOUS | Status: AC

## 2021-09-21 MED ORDER — LORATADINE 10 MG PO TABS
10.0000 mg | ORAL_TABLET | Freq: Once | ORAL | Status: AC
  Administered 2021-09-21: 10 mg via ORAL
  Filled 2021-09-21: qty 1

## 2021-09-21 NOTE — Patient Instructions (Signed)

## 2021-09-28 ENCOUNTER — Other Ambulatory Visit: Payer: Self-pay

## 2021-09-28 ENCOUNTER — Inpatient Hospital Stay: Payer: Medicaid Other

## 2021-09-28 VITALS — BP 126/92 | HR 86 | Temp 98.1°F | Resp 17 | Wt 229.5 lb

## 2021-09-28 DIAGNOSIS — D509 Iron deficiency anemia, unspecified: Secondary | ICD-10-CM | POA: Diagnosis not present

## 2021-09-28 MED ORDER — ACETAMINOPHEN 325 MG PO TABS
650.0000 mg | ORAL_TABLET | Freq: Once | ORAL | Status: AC
  Administered 2021-09-28: 650 mg via ORAL
  Filled 2021-09-28: qty 2

## 2021-09-28 MED ORDER — SODIUM CHLORIDE 0.9 % IV SOLN
300.0000 mg | Freq: Once | INTRAVENOUS | Status: AC
  Administered 2021-09-28: 300 mg via INTRAVENOUS
  Filled 2021-09-28: qty 300

## 2021-09-28 MED ORDER — SODIUM CHLORIDE 0.9 % IV SOLN: Freq: Once | INTRAVENOUS | Status: AC

## 2021-09-28 MED ORDER — LORATADINE 10 MG PO TABS
10.0000 mg | ORAL_TABLET | Freq: Once | ORAL | Status: AC
  Administered 2021-09-28: 10 mg via ORAL
  Filled 2021-09-28: qty 1

## 2021-09-28 NOTE — Progress Notes (Signed)
Pt declined to stay for full 30 min post obs, discharged with VSS. Ambulatory to lobby. ?

## 2021-09-28 NOTE — Patient Instructions (Signed)

## 2021-12-07 ENCOUNTER — Telehealth: Payer: Self-pay | Admitting: Hematology

## 2021-12-07 NOTE — Telephone Encounter (Signed)
Rescheduled upcoming appointment due to provider's PAL. Patient is aware of changes. Mailed calendar.

## 2021-12-14 ENCOUNTER — Inpatient Hospital Stay: Payer: Medicaid Other

## 2021-12-14 ENCOUNTER — Inpatient Hospital Stay: Payer: Medicaid Other | Admitting: Hematology

## 2022-01-20 ENCOUNTER — Other Ambulatory Visit: Payer: Self-pay | Admitting: *Deleted

## 2022-01-20 DIAGNOSIS — D509 Iron deficiency anemia, unspecified: Secondary | ICD-10-CM

## 2022-01-23 ENCOUNTER — Other Ambulatory Visit: Payer: Self-pay

## 2022-01-23 ENCOUNTER — Inpatient Hospital Stay (HOSPITAL_BASED_OUTPATIENT_CLINIC_OR_DEPARTMENT_OTHER): Payer: Medicaid Other | Admitting: Hematology

## 2022-01-23 ENCOUNTER — Inpatient Hospital Stay: Payer: Medicaid Other | Attending: Hematology

## 2022-01-23 VITALS — BP 145/90 | HR 87 | Temp 97.9°F | Resp 15 | Wt 222.0 lb

## 2022-01-23 DIAGNOSIS — D509 Iron deficiency anemia, unspecified: Secondary | ICD-10-CM | POA: Diagnosis not present

## 2022-01-23 DIAGNOSIS — M3119 Other thrombotic microangiopathy: Secondary | ICD-10-CM | POA: Diagnosis not present

## 2022-01-23 LAB — CMP (CANCER CENTER ONLY)
ALT: 9 U/L (ref 0–44)
AST: 11 U/L — ABNORMAL LOW (ref 15–41)
Albumin: 3.8 g/dL (ref 3.5–5.0)
Alkaline Phosphatase: 51 U/L (ref 38–126)
Anion gap: 4 — ABNORMAL LOW (ref 5–15)
BUN: 10 mg/dL (ref 6–20)
CO2: 26 mmol/L (ref 22–32)
Calcium: 9.1 mg/dL (ref 8.9–10.3)
Chloride: 107 mmol/L (ref 98–111)
Creatinine: 0.87 mg/dL (ref 0.44–1.00)
GFR, Estimated: >60 mL/min (ref >60)
Glucose, Bld: 95 mg/dL (ref 70–99)
Potassium: 4 mmol/L (ref 3.5–5.1)
Sodium: 137 mmol/L (ref 135–145)
Total Bilirubin: 0.3 mg/dL (ref 0.3–1.2)
Total Protein: 7.8 g/dL (ref 6.5–8.1)

## 2022-01-23 LAB — CBC WITH DIFFERENTIAL (CANCER CENTER ONLY)
Abs Immature Granulocytes: 0.02 10*3/uL (ref 0.00–0.07)
Basophils Absolute: 0 10*3/uL (ref 0.0–0.1)
Basophils Relative: 1 %
Eosinophils Absolute: 0.1 10*3/uL (ref 0.0–0.5)
Eosinophils Relative: 2 %
HCT: 36.1 % (ref 36.0–46.0)
Hemoglobin: 12.3 g/dL (ref 12.0–15.0)
Immature Granulocytes: 0 %
Lymphocytes Relative: 38 %
Lymphs Abs: 2.8 10*3/uL (ref 0.7–4.0)
MCH: 28.4 pg (ref 26.0–34.0)
MCHC: 34.1 g/dL (ref 30.0–36.0)
MCV: 83.4 fL (ref 80.0–100.0)
Monocytes Absolute: 0.4 10*3/uL (ref 0.1–1.0)
Monocytes Relative: 6 %
Neutro Abs: 3.9 10*3/uL (ref 1.7–7.7)
Neutrophils Relative %: 53 %
Platelet Count: 330 10*3/uL (ref 150–400)
RBC: 4.33 MIL/uL (ref 3.87–5.11)
RDW: 13.3 % (ref 11.5–15.5)
WBC Count: 7.3 10*3/uL (ref 4.0–10.5)
nRBC: 0 % (ref 0.0–0.2)

## 2022-01-23 LAB — IRON AND IRON BINDING CAPACITY (CC-WL,HP ONLY)
Iron: 25 ug/dL — ABNORMAL LOW (ref 28–170)
Saturation Ratios: 8 % — ABNORMAL LOW (ref 10.4–31.8)
TIBC: 318 ug/dL (ref 250–450)
UIBC: 293 ug/dL (ref 148–442)

## 2022-01-23 LAB — FERRITIN: Ferritin: 49 ng/mL (ref 11–307)

## 2022-01-29 ENCOUNTER — Encounter: Payer: Self-pay | Admitting: Hematology

## 2022-01-29 NOTE — Progress Notes (Signed)
HEMATOLOGY/ONCOLOGY CLINIC VISIT NOTE  Date of Service: 01/23/2022    Patient Care Team: Nicolette Bang, MD as PCP - General (Family Medicine) Evelina Bucy, Alycia Rossetti, PA-C as Consulting Physician (Dermatology)  CHIEF COMPLAINTS/PURPOSE OF CONSULTATION:  Follow-up for continued evaluation and management of TTP  HISTORY OF PRESENTING ILLNESS:    INTERVAL HISTORY:    Bess Harvest for follow-up of her history of TTP.  She notes no acute new symptoms since her last clinic visit.  She did not follow-up with her psychological counseling referral but notes that she has been coming to grips with her son's demise.  Her family is also trying to get past the pain. Currently denies any suicidal or homicidal ideation. Patient notes no fevers no chills no night sweats.  No new fatigue.  No petechiae or other bleeding issues.. Labs done today were reviewed in detail with her.Marland Kitchen   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    SURGICAL HISTORY: Past Surgical History:  Procedure Laterality Date   TUBAL LIGATION    tubal ligation in 2010  SOCIAL HISTORY: Social History   Socioeconomic History   Marital status: Single    Spouse name: Not on file   Number of children: 3   Years of education: Not on file   Highest education level: Not on file  Occupational History   Not on file  Tobacco Use   Smoking status: Some Days    Types: Cigarettes    Last attempt to quit: 10/2017    Years since quitting: 4.3   Smokeless tobacco: Never  Vaping Use   Vaping Use: Never used  Substance and Sexual Activity   Alcohol use: Yes   Drug use: Never   Sexual activity: Yes    Birth control/protection: Surgical  Other Topics Concern   Not on file  Social History Narrative   Not on file   Social Determinants of Health   Financial Resource Strain: Not on file  Food Insecurity: Not on file  Transportation  Needs: Not on file  Physical Activity: Not on file  Stress: Not on file  Social Connections: Not on file  Intimate Partner Violence: Not on file    FAMILY HISTORY: Family History  Problem Relation Age of Onset   Hypertension Mother    Diabetes Mother    Diabetes Father    Hypertension Father    Mental illness Father    Arthritis Maternal Grandmother    Cancer Maternal Grandmother    Diabetes Maternal Grandmother    Hypertension Maternal Grandmother    Stroke Maternal Grandmother    Breast cancer Maternal Grandmother    Stroke Maternal Grandfather    Arthritis Paternal Grandmother    Cancer Paternal Grandmother    Diabetes Paternal Grandmother    Hypertension Paternal Grandmother    Breast cancer Paternal Grandmother     ALLERGIES:  is allergic to sulfa antibiotics.  MEDICATIONS:  Current Outpatient Medications  Medication Sig Dispense Refill   amLODipine (NORVASC) 2.5 MG tablet Take 1 tablet (2.5 mg total) by mouth daily. 90 tablet 3   cholecalciferol (VITAMIN D3) 25 MCG (1000 UNIT) tablet Take 1,000 Units by mouth daily.     hydrochlorothiazide (HYDRODIURIL) 25 MG tablet Take 1 tablet (25 mg total) by mouth daily. 90 tablet 3   iron polysaccharides (NIFEREX) 150 MG capsule Take 1 capsule (150 mg total) by mouth 2 (two) times daily. 60 capsule 3  Multiple Vitamin (MULTIVITAMIN WITH MINERALS) TABS tablet Take 1 tablet by mouth daily.     pantoprazole (PROTONIX) 40 MG tablet TAKE 1 TABLET(40 MG) BY MOUTH DAILY AS NEEDED FOR GERD 30 tablet 1   vitamin B-12 (CYANOCOBALAMIN) 50 MCG tablet Take 50 mcg by mouth daily.     Vitamin D, Ergocalciferol, (DRISDOL) 1.25 MG (50000 UNIT) CAPS capsule Take 1 capsule (50,000 Units total) by mouth every 7 (seven) days. 12 capsule 0   zinc gluconate 50 MG tablet Take 50 mg by mouth daily.     No current facility-administered medications for this visit.    REVIEW OF SYSTEMS:   10 Point review of Systems was done is negative except as  noted above.  PHYSICAL EXAMINATION: ECOG FS:1 - Symptomatic but completely ambulatory  Vitals:   01/23/22 0902  BP: (!) 145/90  Pulse: 87  Resp: 15  Temp: 97.9 F (36.6 C)  SpO2: 100%    Wt Readings from Last 3 Encounters:  01/23/22 222 lb (100.7 kg)  09/28/21 229 lb 8 oz (104.1 kg)  08/29/21 231 lb 4.8 oz (104.9 kg)   Body mass index is 34.77 kg/m.    NAD GENERAL:alert, in no acute distress and comfortable SKIN: no acute rashes, no significant lesions EYES: conjunctiva are pink and non-injected, sclera anicteric OROPHARYNX: MMM, no exudates, no oropharyngeal erythema or ulceration NECK: supple, no JVD LYMPH:  no palpable lymphadenopathy in the cervical, axillary or inguinal regions LUNGS: clear to auscultation b/l with normal respiratory effort HEART: regular rate & rhythm ABDOMEN:  normoactive bowel sounds , non tender, not distended. Extremity: no pedal edema PSYCH: alert & oriented x 3 with fluent speech NEURO: no focal motor/sensory deficits   LABORATORY DATA:  I have reviewed the data as listed .    Latest Ref Rng & Units 01/23/2022    8:56 AM 08/30/2021    9:49 AM 10/13/2020    1:59 PM  CBC  WBC 4.0 - 10.5 K/uL 7.3  8.3  10.7   Hemoglobin 12.0 - 15.0 g/dL 12.3  10.9  11.9   Hematocrit 36.0 - 46.0 % 36.1  34.0  36.1   Platelets 150 - 400 K/uL 330  356  314       Latest Ref Rng & Units 01/23/2022    8:56 AM 08/30/2021    9:49 AM 08/29/2021    2:59 PM  CMP  Glucose 70 - 99 mg/dL 95  91  89   BUN 6 - 20 mg/dL '10  16  13   '$ Creatinine 0.44 - 1.00 mg/dL 0.87  0.90  1.03   Sodium 135 - 145 mmol/L 137  137  136   Potassium 3.5 - 5.1 mmol/L 4.0  3.8  3.6   Chloride 98 - 111 mmol/L 107  106  104   CO2 22 - 32 mmol/L '26  27  26   '$ Calcium 8.9 - 10.3 mg/dL 9.1  8.4  9.2   Total Protein 6.5 - 8.1 g/dL 7.8  7.4  8.2   Total Bilirubin 0.3 - 1.2 mg/dL 0.3  0.3  0.2   Alkaline Phos 38 - 126 U/L 51  49  56   AST 15 - 41 U/L '11  10  12   '$ ALT 0 - 44 U/L '9  11  13     '$ . Lab Results  Component Value Date   IRON 25 (L) 01/23/2022   TIBC 318 01/23/2022   IRONPCTSAT 8 (L) 01/23/2022   (Iron  and TIBC)  Lab Results  Component Value Date   FERRITIN 49 01/23/2022      RADIOGRAPHIC STUDIES: I have personally reviewed the radiological images as listed and agreed with the findings in the report. No results found.  ASSESSMENT & PLAN:   36 y.o. female with hydradenitis suppurativa and   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  Severely low ADAMTS 13 levels of 6 on diagnosis. Now normalized to 69 ( ADAMTS13 from 11/26/17 ) S/p Plasmapheresis x 5. Completed Rituxan weekly x 4 doses. On labs today patient has no evidence of TTP relapse at this time.  Normal platelets of 356. Adam TS 13 activity level of 53%  2.  Anemia-microcytic.  Likely due to iron deficiency.   PLAN: Patient labs done.  On 01/23/2022 were discussed in detail with the patient. -Labs stable with improvement in iron deficiency anemia with IV iron. -Patient has no lab or clinical evidence of TTP relapse at this time. -Continue iron polysaccharide 150 mg p.o. daily to continue to address iron deficiency from heavy periods -Continue follow-up with OB/GYN to manage heavy menstrual losses. -Patient notes her depression has improved and she has no suicidal or homicidal ideation.  She declined following with psychological counseling at this time.  FOLLOW UP: Return to clinic with Dr. Irene Limbo with labs in 4 months  The total time spent in the appointment was 20 minutes*.  All of the patient's questions were answered with apparent satisfaction. The patient knows to call the clinic with any problems, questions or concerns.   Sullivan Lone MD MS AAHIVMS [facility] Hematology/Oncology Physician [facility]  .*Total Encounter Time as defined by the Centers for Medicare and Medicaid Services includes, in addition to the face-to-face time of a patient visit  (documented in the note above) non-face-to-face time: obtaining and reviewing outside history, ordering and reviewing medications, tests or procedures, care coordination (communications with other health care professionals or caregivers) and documentation in the medical record.
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
## 2022-03-24 ENCOUNTER — Encounter: Payer: Self-pay | Admitting: Family

## 2022-03-24 ENCOUNTER — Ambulatory Visit (INDEPENDENT_AMBULATORY_CARE_PROVIDER_SITE_OTHER): Payer: Medicaid Other | Admitting: Family

## 2022-03-24 VITALS — BP 148/98 | HR 100 | Temp 98.3°F | Resp 16 | Ht 67.01 in | Wt 225.0 lb

## 2022-03-24 DIAGNOSIS — F419 Anxiety disorder, unspecified: Secondary | ICD-10-CM | POA: Diagnosis not present

## 2022-03-24 DIAGNOSIS — Z91148 Patient's other noncompliance with medication regimen for other reason: Secondary | ICD-10-CM

## 2022-03-24 DIAGNOSIS — G4700 Insomnia, unspecified: Secondary | ICD-10-CM | POA: Insufficient documentation

## 2022-03-24 DIAGNOSIS — F5102 Adjustment insomnia: Secondary | ICD-10-CM | POA: Diagnosis not present

## 2022-03-24 DIAGNOSIS — I10 Essential (primary) hypertension: Secondary | ICD-10-CM

## 2022-03-24 DIAGNOSIS — F32A Depression, unspecified: Secondary | ICD-10-CM

## 2022-03-24 MED ORDER — SERTRALINE HCL 25 MG PO TABS: 25.0000 mg | ORAL_TABLET | Freq: Every day | ORAL | 2 refills | Status: AC

## 2022-03-24 MED ORDER — HYDROXYZINE PAMOATE 25 MG PO CAPS: 25.0000 mg | ORAL_CAPSULE | Freq: Three times a day (TID) | ORAL | 1 refills | Status: DC | PRN

## 2022-03-24 NOTE — Progress Notes (Signed)
Pt presents for anxiety and depression -experiencing insomnia -poor appetite -death of 38 yr old son in Aug 08, 2022, declined counselor therapy

## 2022-03-24 NOTE — Patient Instructions (Signed)
Major Depressive Disorder, Adult Major depressive disorder (MDD) is a mental health condition. It may also be called clinical depression or unipolar depression. MDD causes symptoms of sadness, hopelessness, and loss of interest in things. These symptoms last most of the day, almost every day, for 2 weeks. MDD can also cause physical symptoms. It can interfere with relationships and with everyday activities, such as work, school, and activities that are usually pleasant. MDD may be mild, moderate, or severe. It may be single-episode MDD, which happens once, or recurrent MDD, which may occur multiple times. What are the causes? The exact cause of this condition is not known. MDD is most likely caused by a combination of things, which may include: Your personality traits. Learned or conditioned behaviors or thoughts or feelings that reinforce negativity. Any alcohol or substance misuse. Long-term (chronic) physical or mental health illness. Going through a traumatic experience or major life changes. What increases the risk? The following factors may make someone more likely to develop MDD: A family history of depression. Being a woman. Troubled family relationships. Abnormally low levels of certain brain chemicals. Traumatic or painful events in childhood, especially abuse or loss of a parent. A lot of stress from life experiences, such as poor living conditions or discrimination. Chronic physical illness or other mental health disorders. What are the signs or symptoms? The main symptoms of MDD usually include: Constant depressed or irritable mood. A loss of interest in things and activities. Other symptoms include: Sleeping or eating too much or too little. Unexplained weight gain or weight loss. Tiredness or low energy. Being agitated, restless, or weak. Feeling hopeless, worthless, or guilty. Trouble thinking clearly or making decisions. Thoughts of suicide or thoughts of harming  others. Isolating oneself or avoiding other people or activities. Trouble completing tasks, work, or any normal obligations. Severe symptoms of this condition may include: Psychotic depression.This may include false beliefs, or delusions. It may also include seeing, hearing, tasting, smelling, or feeling things that are not real (hallucinations). Chronic depression or persistent depressive disorder. This is low-level depression that lasts for at least 2 years. Melancholic depression, or feeling extremely sad and hopeless. Catatonic depression, which includes trouble speaking and trouble moving. How is this diagnosed? This condition may be diagnosed based on: Your symptoms. Your medical and mental health history. You may be asked questions about your lifestyle, including any drug and alcohol use. A physical exam. Blood tests to rule out other conditions. MDD is confirmed if you have the following symptoms most of the day, nearly every day, in a 2-week period: Either a depressed mood or loss of interest. At least four other MDD symptoms. How is this treated? This condition is usually treated by mental health professionals, such as psychologists, psychiatrists, and clinical social workers. You may need more than one type of treatment. Treatment may include: Psychotherapy, also called talk therapy or counseling. Types of psychotherapy include: Cognitive behavioral therapy (CBT). This teaches you to recognize unhealthy feelings, thoughts, and behaviors, and replace them with positive thoughts and actions. Interpersonal therapy (IPT). This helps you to improve the way you communicate with others or relate to them. Family therapy. This treatment includes members of your family. Medicines to treat anxiety and depression. These medicines help to balance the brain chemicals that affect your emotions. Lifestyle changes. You may be asked to: Limit alcohol use and avoid drug use. Get regular  exercise. Get plenty of sleep. Make healthy eating choices. Spend more time outdoors. Brain stimulation. This may  be done if symptoms are very severe and other treatments have not worked. Examples of this treatment are electroconvulsive therapy and transcranial magnetic stimulation. Follow these instructions at home: Activity Exercise regularly and spend time outdoors. Find activities that you enjoy doing, and make time to do them. Find healthy ways to manage stress, such as: Meditation or deep breathing. Spending time in nature. Journaling. Return to your normal activities as told by your health care provider. Ask your health care provider what activities are safe for you. Alcohol and drug use If you drink alcohol: Limit how much you use to: 0-1 drink a day for women who are not pregnant. 0-2 drinks a day for men. Be aware of how much alcohol is in your drink. In the [place] one drink equals one 12 oz bottle of beer (355 mL), one 5 oz glass of wine (148 mL), or one 1 oz glass of hard liquor (44 mL). Discuss your alcohol use with your health care provider. Alcohol can affect any antidepressant medicines you are taking. Discuss any drug use with your health care provider. General instructions  Take over-the-counter and prescription medicines only as told by your health care provider. Eat a healthy diet and get plenty of sleep. Consider joining a support group. Your health care provider may be able to recommend one. Keep all follow-up visits as told by your health care provider. This is important. Where to find more information [employer] on Mental Illness: www.nami.[place]: https://carter.com/ Contact a health care provider if: Your symptoms get worse. You develop new symptoms. Get help right away if: You self-harm. You have serious thoughts about hurting yourself or others. You hallucinate. If you ever feel like you may hurt yourself or  others, or have thoughts about taking your own life, get help right away. Go to your nearest emergency department or: Call your local emergency services (911 in the [place] Call a suicide crisis helpline, such as the [place] at 662-810-5257 or 988 in the [place]. This is open 24 hours a day in the [place] Text the Crisis Text Line at 947-787-7201 (in the [place].). Summary Major depressive disorder (MDD) is a mental health condition. MDD causes symptoms of sadness, hopelessness, and loss of interest in things. These symptoms last most of the day, almost every day, for 2 weeks. The symptoms of MDD can interfere with relationships and with everyday activities. Treatments and support are available for people who develop MDD. You may need more than one type of treatment. Get help right away if you have serious thoughts about hurting yourself or others. This information is not intended to replace advice given to you by your health care provider. Make sure you discuss any questions you have with your health care provider. Document Revised: 12/15/2020 Document Reviewed: 05/03/2019 Elsevier Patient Education  [place].

## 2022-04-07 ENCOUNTER — Ambulatory Visit: Payer: Medicaid Other | Admitting: Family Medicine

## 2022-04-21 ENCOUNTER — Ambulatory Visit (INDEPENDENT_AMBULATORY_CARE_PROVIDER_SITE_OTHER): Payer: Medicaid Other | Admitting: Family Medicine

## 2022-04-21 VITALS — BP 129/87 | HR 84 | Temp 98.1°F | Resp 16 | Wt 231.4 lb

## 2022-04-21 DIAGNOSIS — F32A Depression, unspecified: Secondary | ICD-10-CM

## 2022-04-21 DIAGNOSIS — F419 Anxiety disorder, unspecified: Secondary | ICD-10-CM

## 2022-04-21 MED ORDER — SERTRALINE HCL 50 MG PO TABS: 50.0000 mg | ORAL_TABLET | Freq: Every day | ORAL | 1 refills | Status: DC

## 2022-04-24 ENCOUNTER — Encounter: Payer: Self-pay | Admitting: Family Medicine

## 2022-04-24 NOTE — Progress Notes (Signed)
Established Patient Office Visit  Subjective    Patient ID: HARVEST DEIST, female    DOB: 1985/12/17  Age: 36 y.o. MRN: [id number]  CC:  Chief Complaint  Patient presents with   Follow-up    HPI Sarah Washington presents for follow up of depression/anxiety. Patient reports that she is improving with the meds.    Outpatient Encounter Medications as of 04/21/2022  Medication Sig   amLODipine (NORVASC) 2.5 MG tablet Take 1 tablet (2.5 mg total) by mouth daily.   cholecalciferol (VITAMIN D3) 25 MCG (1000 UNIT) tablet Take 1,000 Units by mouth daily.   hydrochlorothiazide (HYDRODIURIL) 25 MG tablet Take 1 tablet (25 mg total) by mouth daily.   hydrOXYzine (VISTARIL) 25 MG capsule Take 1 capsule (25 mg total) by mouth every 8 (eight) hours as needed.   iron polysaccharides (NIFEREX) 150 MG capsule Take 1 capsule (150 mg total) by mouth 2 (two) times daily.   Multiple Vitamin (MULTIVITAMIN WITH MINERALS) TABS tablet Take 1 tablet by mouth daily.   pantoprazole (PROTONIX) 40 MG tablet TAKE 1 TABLET(40 MG) BY MOUTH DAILY AS NEEDED FOR GERD   sertraline (ZOLOFT) 25 MG tablet Take 1 tablet (25 mg total) by mouth daily.   sertraline (ZOLOFT) 50 MG tablet Take 1 tablet (50 mg total) by mouth daily.   vitamin B-12 (CYANOCOBALAMIN) 50 MCG tablet Take 50 mcg by mouth daily.   zinc gluconate 50 MG tablet Take 50 mg by mouth daily.   No facility-administered encounter medications on file as of 04/21/2022.    Past Medical History:  Diagnosis Date   Anxiety and depression    Normocytic anemia    TTP (thrombotic thrombocytopenic purpura) ([facility])     Past Surgical History:  Procedure Laterality Date   TUBAL LIGATION      Family History  Problem Relation Age of Onset   Hypertension Mother    Diabetes Mother    Diabetes Father    Hypertension Father    Mental illness Father    Arthritis Maternal Grandmother    Cancer Maternal Grandmother    Diabetes Maternal Grandmother     Hypertension Maternal Grandmother    Stroke Maternal Grandmother    Breast cancer Maternal Grandmother    Stroke Maternal Grandfather    Arthritis Paternal Grandmother    Cancer Paternal Grandmother    Diabetes Paternal Grandmother    Hypertension Paternal Grandmother    Breast cancer Paternal Grandmother     Social History   Socioeconomic History   Marital status: Single    Spouse name: Not on file   Number of children: 3   Years of education: Not on file   Highest education level: Not on file  Occupational History   Not on file  Tobacco Use   Smoking status: Some Days    Types: Cigarettes    Last attempt to quit: 10/2017    Years since quitting: 4.5    Passive exposure: Current   Smokeless tobacco: Never  Vaping Use   Vaping Use: Never used  Substance and Sexual Activity   Alcohol use: Yes   Drug use: Never   Sexual activity: Yes    Birth control/protection: Surgical  Other Topics Concern   Not on file  Social History Narrative   Not on file   Social Determinants of Health   Financial Resource Strain: Not on file  Food Insecurity: Not on file  Transportation Needs: Not on file  Physical Activity: Not on file  Stress:  Not on file  Social Connections: Not on file  Intimate Partner Violence: Not on file    Review of Systems  Psychiatric/Behavioral:  Positive for depression. Negative for suicidal ideas. The patient is nervous/anxious and has insomnia.   All other systems reviewed and are negative.       Objective    BP 129/87   Pulse 84   Temp 98.1 F (36.7 C) (Oral)   Resp 16   Wt 231 lb 6.4 oz (105 kg)   BMI 36.23 kg/m   Physical Exam Vitals and nursing note reviewed.  Constitutional:      General: She is not in acute distress. Cardiovascular:     Rate and Rhythm: Normal rate and regular rhythm.  Pulmonary:     Effort: Pulmonary effort is normal.     Breath sounds: Normal breath sounds.  Neurological:     General: No focal deficit  present.     Mental Status: She is alert and oriented to person, place, and time.  Psychiatric:        Mood and Affect: Mood is anxious and depressed. Affect is tearful.        Speech: Speech normal.        Behavior: Behavior normal. Behavior is cooperative.         Assessment & Plan:   1. Anxiety and depression Increased zoloft from 25 mg to 50 mg and will monitor    Return in about 4 weeks (around 05/19/2022) for follow up.   Becky Sax, MD

## 2022-05-01 ENCOUNTER — Encounter: Payer: Self-pay | Admitting: Obstetrics and Gynecology

## 2022-05-01 ENCOUNTER — Other Ambulatory Visit (HOSPITAL_COMMUNITY)
Admission: RE | Admit: 2022-05-01 | Discharge: 2022-05-01 | Disposition: A | Discharge status: Home / Self Care | Payer: Medicaid Other | Source: Ambulatory Visit | Attending: Obstetrics and Gynecology | Admitting: Obstetrics and Gynecology

## 2022-05-01 ENCOUNTER — Ambulatory Visit (INDEPENDENT_AMBULATORY_CARE_PROVIDER_SITE_OTHER): Payer: Medicaid Other | Admitting: Obstetrics and Gynecology

## 2022-05-01 VITALS — BP 143/94 | HR 102 | Wt 225.6 lb

## 2022-05-01 DIAGNOSIS — N923 Ovulation bleeding: Secondary | ICD-10-CM

## 2022-05-01 DIAGNOSIS — N879 Dysplasia of cervix uteri, unspecified: Secondary | ICD-10-CM | POA: Diagnosis not present

## 2022-05-01 NOTE — Progress Notes (Signed)
NEW GYNECOLOGY PATIENT Patient name: Sarah Washington MRN [id number]  Date of birth: [birth year] Chief Complaint:   Menstrual Problem     History:  Sarah Washington is a 36 y.o. [phone] being seen today for intermenstrual bleeding.    For 2 months, 1.5 weeks after finishing a menses would notice pink and small blood clots but nothing that required a pad (would need a pantiliner). Menses usually end/beginning of month. No spotting with her last menses and next menses will start soon. No pain with spotting but does have cramping, headaches, bloating with her menses. Has heavy menses at baseline and they have always been heavy. Has completed childbearing and interested in hysterectomy. Currently wearing a brief due to the heaviness of flow. Mother had a hysterectomy for bleeding. No pain outside of menses other than an HS flare causing pain wherever there is a boil.  Has had high blood pressure for years - trying to take medications as prescribed for the last month or so.  Noted to have increased stress and emotional lability due to sudden loss of 64  year old son earlier this year (accidentally shot by friend). Currently seeing MH .        Gynecologic History No LMP recorded (lmp unknown). Contraception: tubal ligation Last Pap: 08/2019. 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  BIRADS 3 - probably benign  Obstetric History OB History  Gravida Para Term Preterm AB Living  '3 1   1   3  '$ SAB IAB Ectopic Multiple Live Births          3    # Outcome Date GA Lbr Len/2nd Weight Sex Delivery Anes PTL Lv  3 Gravida           2 Gravida           1 Preterm             Past Medical History:  Diagnosis Date   Anxiety and depression    Normocytic anemia    TTP (thrombotic thrombocytopenic purpura) (HCC)     Past Surgical History:  Procedure Laterality Date   TUBAL LIGATION      Current Outpatient Medications on File Prior to Visit  Medication  Sig Dispense Refill   amLODipine (NORVASC) 2.5 MG tablet Take 1 tablet (2.5 mg total) by mouth daily. 90 tablet 3   cholecalciferol (VITAMIN D3) 25 MCG (1000 UNIT) tablet Take 1,000 Units by mouth daily.     hydrochlorothiazide (HYDRODIURIL) 25 MG tablet Take 1 tablet (25 mg total) by mouth daily. 90 tablet 3   hydrOXYzine (VISTARIL) 25 MG capsule Take 1 capsule (25 mg total) by mouth every 8 (eight) hours as needed. 30 capsule 1   Multiple Vitamin (MULTIVITAMIN WITH MINERALS) TABS tablet Take 1 tablet by mouth daily.     pantoprazole (PROTONIX) 40 MG tablet TAKE 1 TABLET(40 MG) BY MOUTH DAILY AS NEEDED FOR GERD 30 tablet 1   sertraline (ZOLOFT) 25 MG tablet Take 1 tablet (25 mg total) by mouth daily. 30 tablet 2   sertraline (ZOLOFT) 50 MG tablet Take 1 tablet (50 mg total) by mouth daily. 30 tablet 1   iron polysaccharides (NIFEREX) 150 MG capsule Take 1 capsule (150 mg total) by mouth 2 (two) times daily. (Patient not taking: Reported on 05/01/2022) 60 capsule 3   vitamin B-12 (CYANOCOBALAMIN) 50 MCG tablet Take 50 mcg by mouth daily. (Patient not taking: Reported on 05/01/2022)  zinc gluconate 50 MG tablet Take 50 mg by mouth daily. (Patient not taking: Reported on 05/01/2022)     No current facility-administered medications on file prior to visit.    Allergies  Allergen Reactions   Sulfa Antibiotics Hives    Bactrim    Social History:  reports that she has been smoking cigarettes. She has been exposed to tobacco smoke. She has never used smokeless tobacco. She reports current alcohol use. She reports that she does not use drugs.  Family History  Problem Relation Age of Onset   Hypertension Mother    Diabetes Mother    Diabetes Father    Hypertension Father    Mental illness Father    Arthritis Maternal Grandmother    Cancer Maternal Grandmother    Diabetes Maternal Grandmother    Hypertension Maternal Grandmother    Stroke Maternal Grandmother    Breast cancer Maternal  Grandmother    Stroke Maternal Grandfather    Arthritis Paternal Grandmother    Cancer Paternal Grandmother    Diabetes Paternal Grandmother    Hypertension Paternal Grandmother    Breast cancer Paternal Grandmother     The following portions of the patient's history were reviewed and updated as appropriate: allergies, current medications, past family history, past medical history, past social history, past surgical history and problem list.  Review of Systems Pertinent items noted in HPI and remainder of comprehensive ROS otherwise negative.  Physical Exam:  BP (!) 143/94   Pulse (!) 102   Wt 225 lb 9.6 oz (102.3 kg)   LMP  (LMP Unknown)   BMI 35.33 kg/m  Physical Exam Vitals and nursing note reviewed. Exam conducted with a chaperone present.  Constitutional:      Appearance: Normal appearance.  Cardiovascular:     Rate and Rhythm: Normal rate.  Pulmonary:     Effort: Pulmonary effort is normal.     Breath sounds: Normal breath sounds.  Genitourinary:    General: Normal vulva.     Exam position: Lithotomy position.     Vagina: Normal.     Cervix: Normal.  Neurological:     General: No focal deficit present.     Mental Status: She is alert and oriented to person, place, and time.  Psychiatric:        Mood and Affect: Mood normal.        Behavior: Behavior normal.        Thought Content: Thought content normal.        Judgment: Judgment normal.     Assessment and Plan:   1. Spotting between menses Possibly due to increased stress vs vaginitis, no spotting with most recent menses, so may have resolved. Patient interested in hysterectomy - will have f/up with Dr. Rip Harbour whom she has seen before to discuss vaginal hysterectomy.   2. Cervical intraepithelial neoplasia (CIN) Repeat pap collected today as past due given hx of LEEP in 2021  - Cytology - PAP( [facility] [facility])   Routine preventative health maintenance measures emphasized. Please refer to After Visit Summary  for other counseling recommendations.      Darliss Cheney, MD Obstetrician & Gynecologist, [facility] [employer], [place]

## 2022-05-02 ENCOUNTER — Telehealth: Payer: Self-pay | Admitting: Hematology

## 2022-05-02 NOTE — Telephone Encounter (Signed)
Called patient to r/s dec. Appt. Patient notified of new appt. Time.

## 2022-05-03 ENCOUNTER — Ambulatory Visit: Payer: Medicaid Other | Admitting: Family Medicine

## 2022-05-03 LAB — CYTOLOGY - PAP
Chlamydia: NEGATIVE
Comment: NEGATIVE
Comment: NEGATIVE
Comment: NEGATIVE
Comment: NEGATIVE
Comment: NORMAL
Diagnosis: NEGATIVE
Diagnosis: REACTIVE
HPV 16: NEGATIVE
HPV 18 / 45: NEGATIVE
High risk HPV: POSITIVE — AB
Neisseria Gonorrhea: NEGATIVE

## 2022-05-24 ENCOUNTER — Ambulatory Visit: Payer: Medicaid Other | Admitting: Family Medicine

## 2022-05-26 ENCOUNTER — Ambulatory Visit: Payer: Medicaid Other | Admitting: Hematology

## 2022-05-26 ENCOUNTER — Other Ambulatory Visit: Payer: Medicaid Other

## 2022-06-01 ENCOUNTER — Other Ambulatory Visit: Payer: Self-pay

## 2022-06-01 DIAGNOSIS — D509 Iron deficiency anemia, unspecified: Secondary | ICD-10-CM

## 2022-06-05 DIAGNOSIS — D219 Benign neoplasm of connective and other soft tissue, unspecified: Secondary | ICD-10-CM

## 2022-06-05 DIAGNOSIS — M3119 Other thrombotic microangiopathy: Secondary | ICD-10-CM

## 2022-06-05 HISTORY — DX: Benign neoplasm of connective and other soft tissue, unspecified: D21.9

## 2022-06-05 HISTORY — DX: Other thrombotic microangiopathy: M31.19

## 2022-06-06 ENCOUNTER — Inpatient Hospital Stay: Payer: Medicaid Other | Attending: Hematology

## 2022-06-06 ENCOUNTER — Inpatient Hospital Stay: Payer: Medicaid Other | Admitting: Hematology

## 2022-06-06 DIAGNOSIS — M3119 Other thrombotic microangiopathy: Secondary | ICD-10-CM | POA: Insufficient documentation

## 2022-06-06 DIAGNOSIS — Z79899 Other long term (current) drug therapy: Secondary | ICD-10-CM | POA: Insufficient documentation

## 2022-06-06 DIAGNOSIS — D509 Iron deficiency anemia, unspecified: Secondary | ICD-10-CM | POA: Insufficient documentation

## 2022-06-17 IMAGING — MG MM DIGITAL DIAGNOSTIC UNILAT*R* W/ TOMO W/ CAD
4 series · 4 of 12 positions shown · non-contrast
Comparison: Previous exam(s).

CLINICAL DATA: Follow-up for probably benign asymmetry in the RIGHT
breast. This probably benign finding was originally identified on
baseline mammogram dated 09/02/2020.

EXAM:
DIGITAL DIAGNOSTIC UNILATERAL RIGHT MAMMOGRAM WITH TOMOSYNTHESIS AND
CAD
TECHNIQUE: Right digital diagnostic mammography and breast tomosynthesis was
performed. The images were evaluated with computer-aided detection.

[R MLO synth-2D]
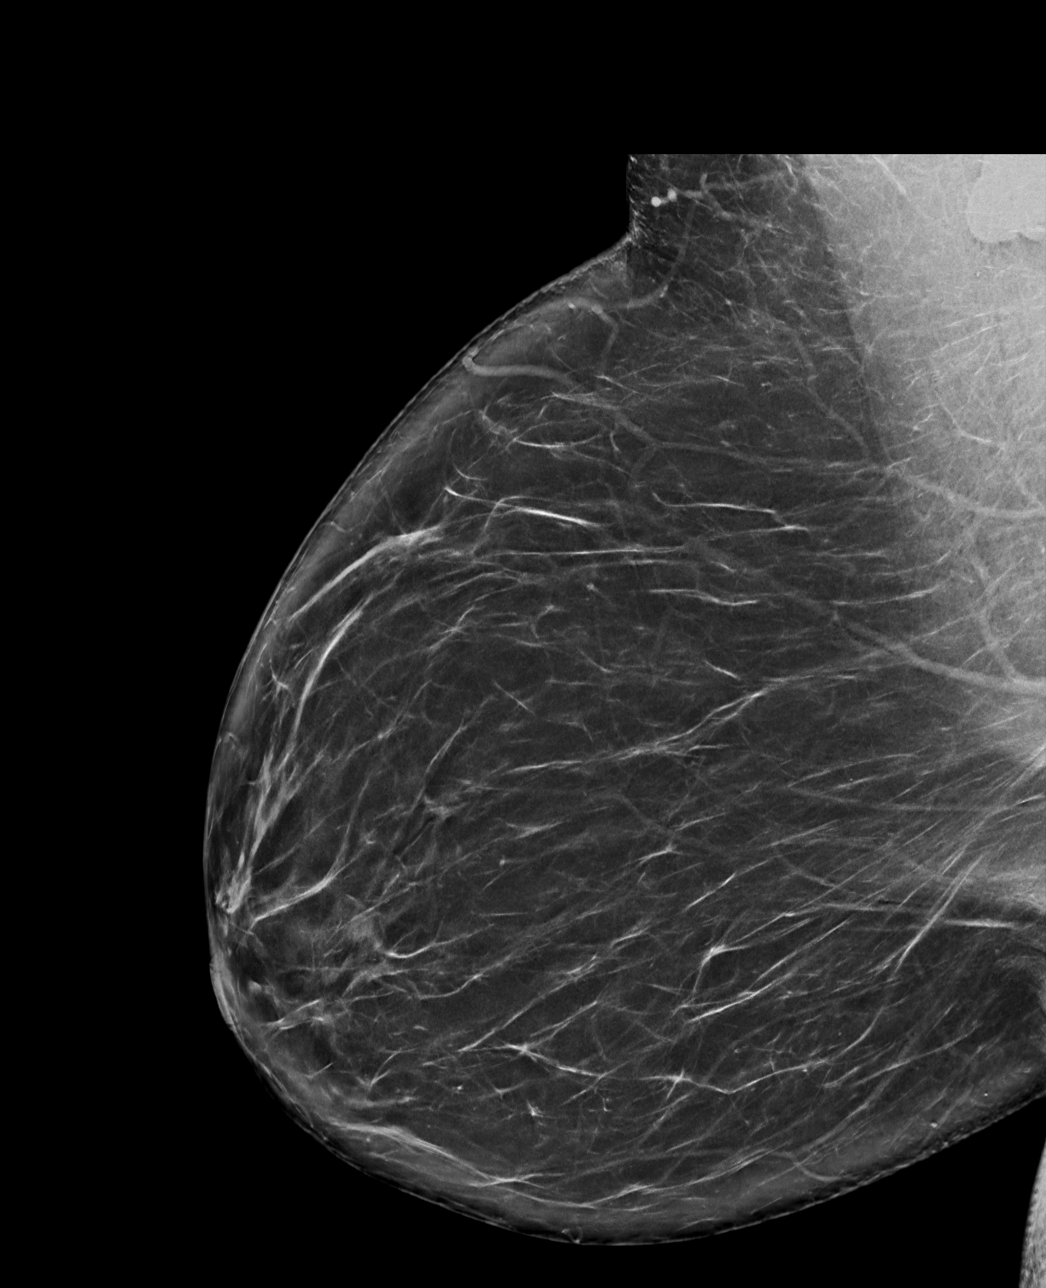

[R CC synth-2D]
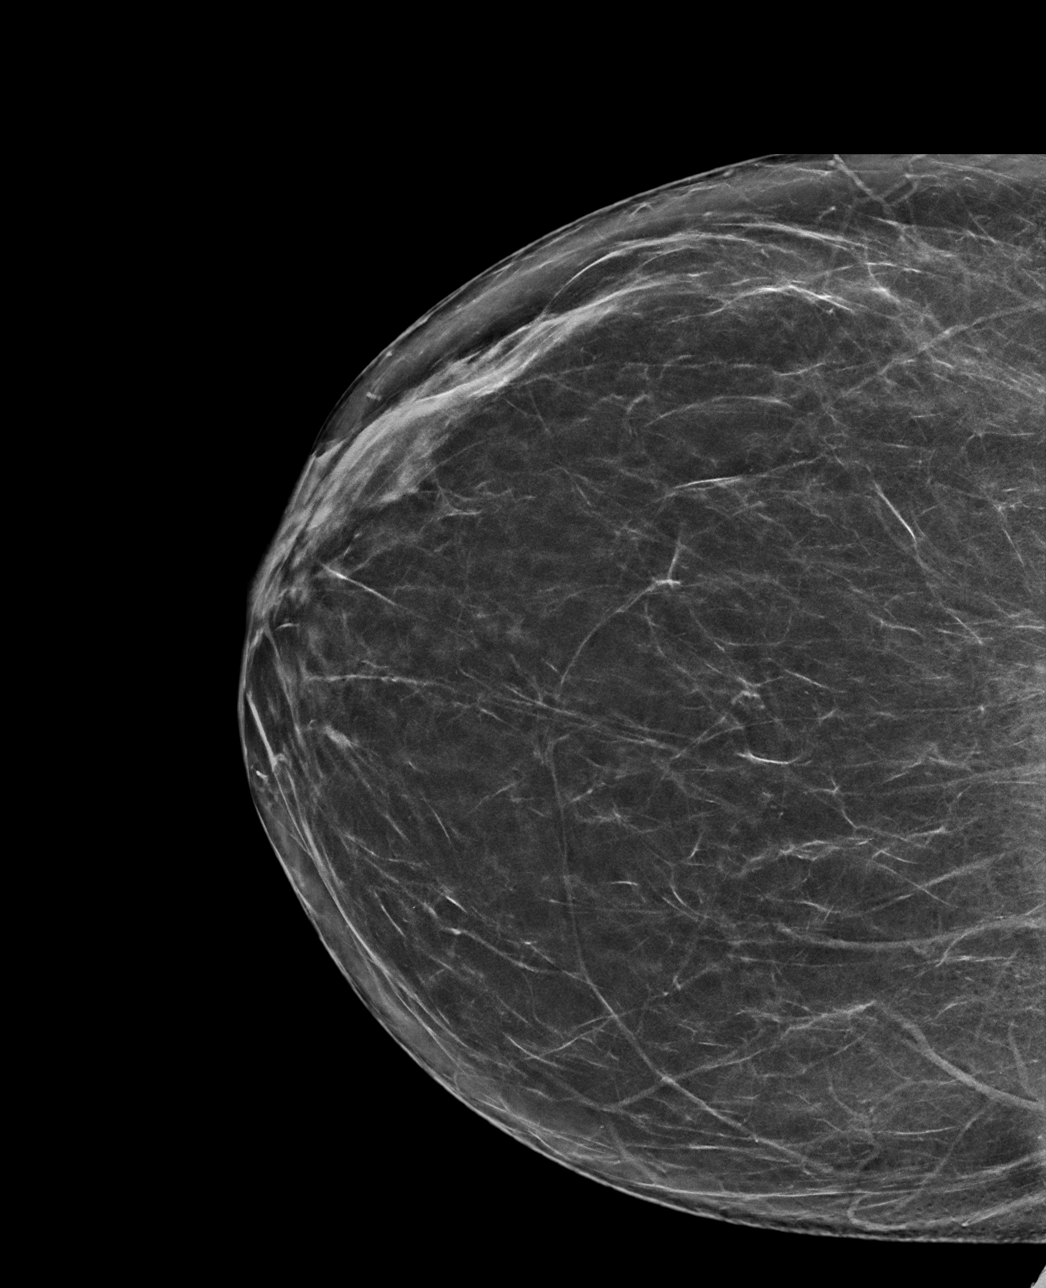

[R MLO tomo · tomo slice 48/95.0]
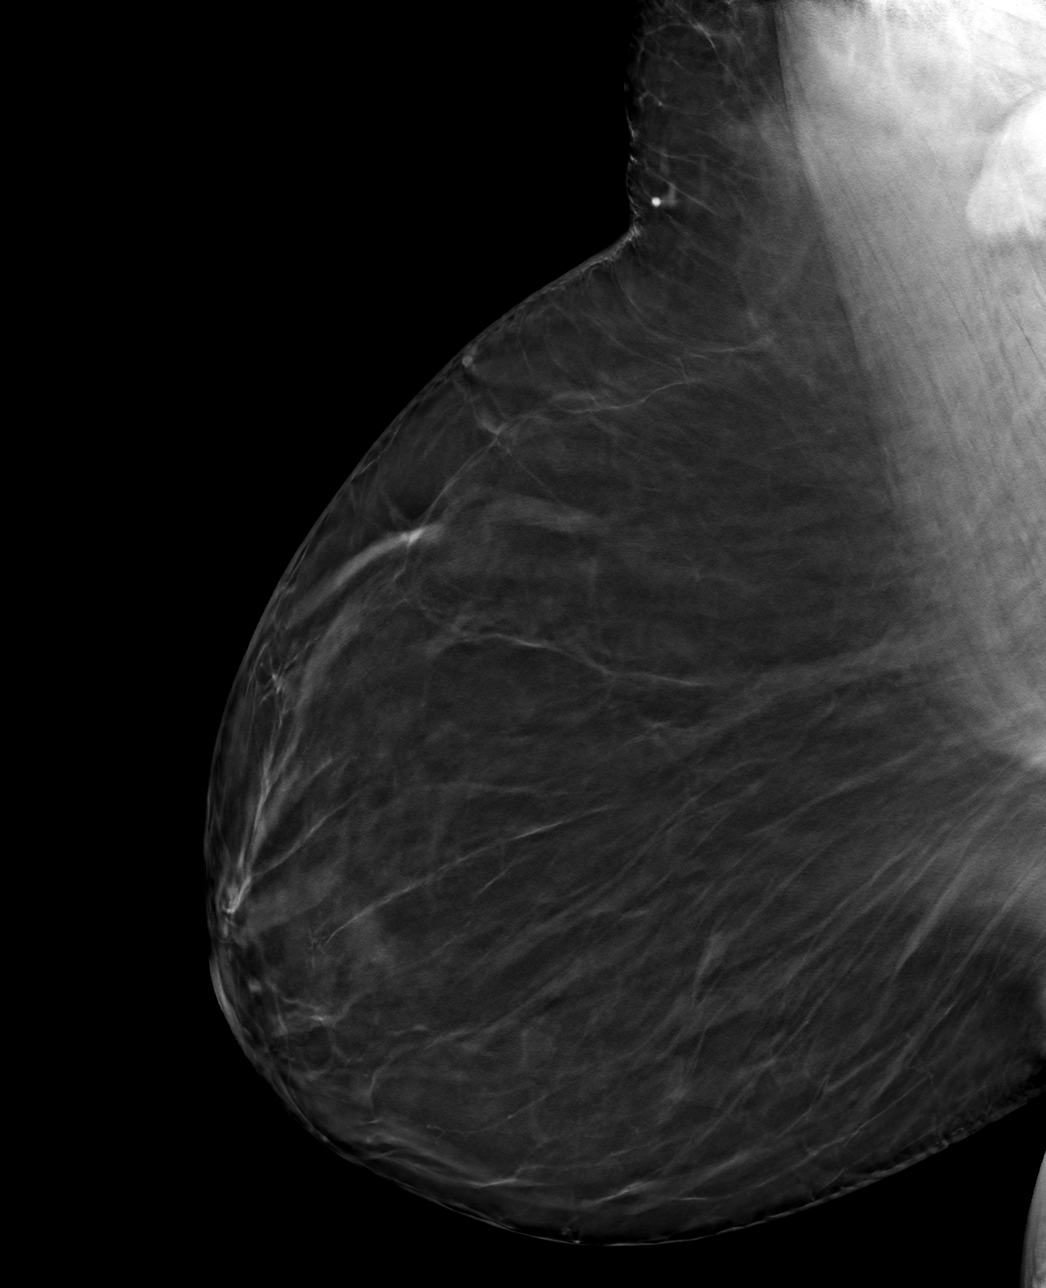

[R CC tomo · tomo slice 37/72.0]
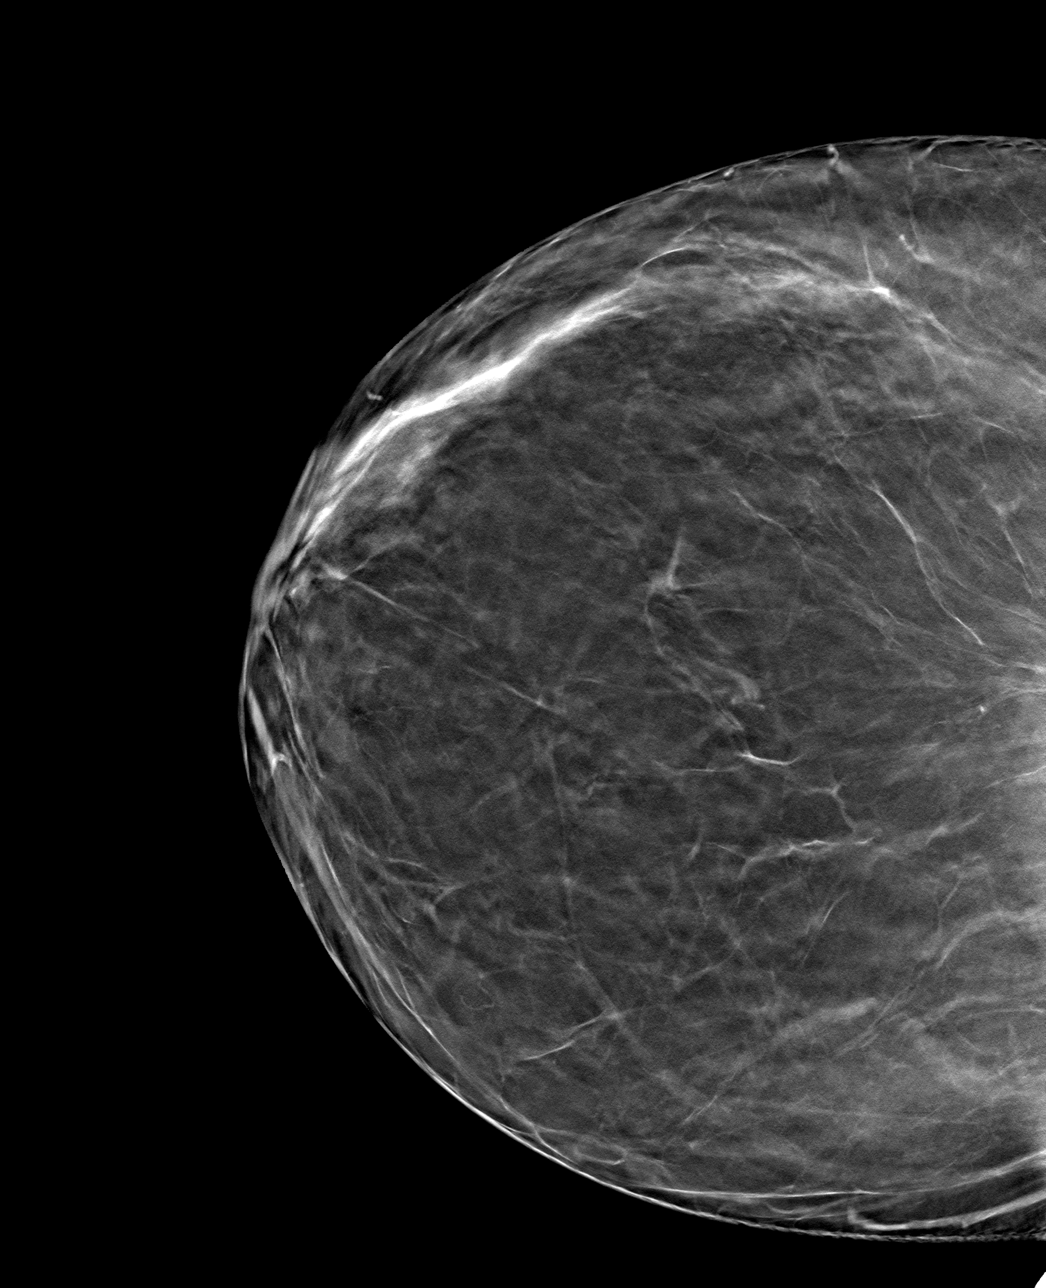

[4 of 12 positions shown; findings below may reference images not displayed]

ACR Breast Density Category b: There are scattered areas of
fibroglandular density.
FINDINGS: The fibroglandular pattern of the RIGHT breast is stable. The
previously described asymmetry within the lateral retroareolar RIGHT
breast is less conspicuous suggesting benignity. There are no new
dominant masses, suspicious calcifications or secondary signs of
malignancy within the RIGHT breast.
IMPRESSION: Previously described asymmetry within the lateral retroareolar RIGHT
breast is less conspicuous today suggesting benignity, most likely
superimposition of normal dense fibroglandular tissues. Recommend
additional follow-up diagnostic mammogram in 6 months to ensure
continued stability.

RECOMMENDATION:
RIGHT breast diagnostic mammogram in 6 months.

I have discussed the findings and recommendations with the patient.
If applicable, a reminder letter will be sent to the patient
regarding the next appointment.

BI-RADS CATEGORY  3: Probably benign.

## 2022-06-18 ENCOUNTER — Other Ambulatory Visit: Payer: Self-pay | Admitting: Family

## 2022-06-18 DIAGNOSIS — F32A Depression, unspecified: Secondary | ICD-10-CM

## 2022-06-30 ENCOUNTER — Ambulatory Visit (INDEPENDENT_AMBULATORY_CARE_PROVIDER_SITE_OTHER): Payer: Medicaid Other | Admitting: Obstetrics and Gynecology

## 2022-06-30 ENCOUNTER — Other Ambulatory Visit: Payer: Self-pay

## 2022-06-30 ENCOUNTER — Telehealth: Payer: Self-pay

## 2022-06-30 ENCOUNTER — Encounter: Payer: Self-pay | Admitting: Obstetrics and Gynecology

## 2022-06-30 VITALS — BP 122/85 | HR 103 | Ht 67.0 in | Wt 233.7 lb

## 2022-06-30 DIAGNOSIS — N920 Excessive and frequent menstruation with regular cycle: Secondary | ICD-10-CM | POA: Diagnosis not present

## 2022-06-30 DIAGNOSIS — D219 Benign neoplasm of connective and other soft tissue, unspecified: Secondary | ICD-10-CM | POA: Insufficient documentation

## 2022-06-30 NOTE — Patient Instructions (Signed)
Vaginal Hysterectomy, Care After The following information offers guidance on how to care for yourself after your procedure. Your health care provider may also give you more specific instructions. If you have problems or questions, contact your health care provider. What can I expect after the procedure? After the procedure, it is common to have: Pain in the lower abdomen and vagina. Vaginal bleeding and discharge for up to 1 week. You will need to use a sanitary pad after this procedure. Difficulty having a bowel movement (constipation). Temporary problems emptying the bladder. Tiredness (fatigue). Poor appetite. Less interest in sex. Feelings of sadness or other emotions. If your ovaries were also removed, it is also common to have symptoms of menopause, such as hot flashes, night sweats, and lack of sleep (insomnia). Follow these instructions at home: Medicines  Take over-the-counter and prescription medicines only as told by your health care provider. Ask your health care provider if the medicine prescribed to you: Requires you to avoid driving or using machinery. Can cause constipation. You may need to take these actions to prevent or treat constipation: Drink enough fluid to keep your urine pale yellow. Take over-the-counter or prescription medicines. Eat foods that are high in fiber, such as beans, whole grains, and fresh fruits and vegetables. Limit foods that are high in fat and processed sugars, such as fried or sweet foods. Activity  Rest as told by your health care provider. Return to your normal activities as told by your health care provider. Ask your health care provider what activities are safe for you Avoid sitting for a long time without moving. Get up to take short walks every 1-2 hours. This is important to improve blood flow and breathing. Ask for help if you feel weak or unsteady. Try to have someone home with you for 1-2 weeks to help you with everyday chores. Do  not lift anything that is heavier than 10 lb (4.5 kg), or the limit that you are told, until your health care provider says that it is safe. If you were given a sedative during the procedure, it can affect you for several hours. Do not drive or operate machinery until your health care provider says that it is safe. Lifestyle Do not use any products that contain nicotine or tobacco. These products include cigarettes, chewing tobacco, and vaping devices, such as e-cigarettes. These can delay healing after surgery. If you need help quitting, ask your health care provider. Do not drink alcohol until your health care provider approves. General instructions Do not douche, use tampons, or have sex for at least 6 weeks, or as told by your health care provider. If you struggle with physical or emotional changes after your procedure, speak with your health care provider or a therapist. The stitches inside your vagina will dissolve over time and do not need to be taken out. Do not take baths, swim, or use a hot tub until your health care provider approves. You may only be allowed to take showers for 2-3 weeks Wear compression stockings as told by your health care provider. These stockings help to prevent blood clots and reduce swelling in your legs. Keep all follow-up visits. This is important. Contact a health care provider if: Your pain medicine is not helping. You have a fever. You have nausea or vomiting that does not go away. You feel dizzy. You have blood, pus, or a bad-smelling discharge from your vagina more than 1 week after the procedure. You continue to have trouble urinating 3-5  days after the procedure. Get help right away if: You have severe pain in your abdomen or back. You faint. You have heavy vaginal bleeding and blood clots, soaking through a sanitary pad in less than 1 hour. You have chest pain or shortness of breath. You have pain, swelling, or redness in your leg. These symptoms  may represent a serious problem that is an emergency. Do not wait to see if the symptoms will go away. Get medical help right away. Call your local emergency services (911 in the [place] Do not drive yourself to the hospital. Summary After the procedure, it is common to have pain, vaginal bleeding, constipation, temporary problems emptying your bladder, and feelings of sadness or other emotions. Take over-the-counter and prescription medicines only as told by your health care provider. Rest as told by your health care provider. Return to your normal activities as told by your health care provider. Contact a health care provider if your pain medicine is not helping, or you have a fever, dizziness, or trouble urinating several days after the procedure. Get help right away if you have severe pain in your abdomen or back, or if you faint, have heavy bleeding, or have chest pain or shortness of breath. This information is not intended to replace advice given to you by your health care provider. Make sure you discuss any questions you have with your health care provider. Document Revised: 08/03/2021 Document Reviewed: 01/23/2020 Elsevier Patient Education  [place]. Vaginal Hysterectomy  A vaginal hysterectomy is a procedure to remove all or part of the uterus through a small incision in the vagina. In this procedure, your health care provider may remove your entire uterus, including the cervix. The cervix is the opening and bottom part of the uterus and is located between the vagina and the uterus. Sometimes, the ovaries and fallopian tubes are also removed. This surgery may be done to treat problems such as: Noncancerous growths in the uterus (uterine fibroids) that cause symptoms. A condition that causes the lining of the uterus to grow in other areas (endometriosis). Problems with pelvic support. Cancer of the cervix, ovaries, uterus, or tissue that lines the uterus (endometrium). Excessive  bleeding in the uterus. When removing your uterus, your health care provider may also remove the ovaries and the fallopian tubes. After this procedure, you will no longer be able to have a baby, and you will no longer have a menstrual period. Tell a health care provider about: Any allergies you have. All medicines you are taking, including vitamins, herbs, eye drops, creams, and over-the-counter medicines. Any problems you or family members have had with anesthetic medicines. Any blood disorders you have. Any surgeries you have had. Any medical conditions you have. Whether you are pregnant or may be pregnant. What are the risks? Generally, this is a safe procedure. However, problems may occur, including: Bleeding. Infection. Blood clots in the legs or lungs. Damage to nearby structures or organs. Pain during sex. Allergic reactions to medicines. What happens before the procedure? Staying hydrated Follow instructions from your health care provider about hydration, which may include: Up to 2 hours before the procedure - you may continue to drink clear liquids, such as water, clear fruit juice, black coffee, and plain tea.  Eating and drinking restrictions Follow instructions from your health care provider about eating and drinking, which may include: 8 hours before the procedure - stop eating heavy meals or foods, such as meat, fried foods, or fatty foods. 6 hours  before the procedure - stop eating light meals or foods, such as toast or cereal. 6 hours before the procedure - stop drinking milk or drinks that contain milk. 2 hours before the procedure - stop drinking clear liquids. Medicines Take over-the-counter and prescription medicines only as told by your health care provider. You may be asked to take a medicine to empty your colon (bowel preparation). General instructions If you were asked to do a bowel preparation before the procedure, follow instructions from your health care  provider. This procedure can affect the way you feel about yourself. Talk with your health care provider about the physical and emotional changes hysterectomy may cause. Do not use any products that contain nicotine or tobacco for at least 4 weeks before the procedure. These products include cigarettes, e-cigarettes, and chewing tobacco. If you need help quitting, ask your health care provider. Plan to have a responsible adult take you home from the hospital or clinic. Plan to have a responsible adult care for you for the time you are told after you leave the hospital or clinic. This is important. Surgery safety Ask your health care provider: How your surgery site will be marked. What steps will be taken to help prevent infection. These may include: Removing hair at the surgery site. Washing skin with a germ-killing soap. Receiving antibiotic medicine. What happens during the procedure? An IV will be inserted into one of your veins. You will be given one or more of the following: A medicine to help you relax (sedative). A medicine to numb the area (local anesthetic). A medicine to make you fall asleep (general anesthetic). A medicine that is injected into your spine to numb the area below and slightly above the injection site (spinal anesthetic). A medicine that is injected into an area of your body to numb everything below the injection site (regional anesthetic). Your surgeon will make an incision in your vagina. Your surgeon will locate and remove all or part of your uterus. Part or all of the uterus will be removed through the vagina. Your ovaries and fallopian tubes may be removed at the same time. The incision in your vagina will be closed with stitches (sutures) that dissolve over time. The procedure may vary among health care providers and hospitals. What happens after the procedure? Your blood pressure, heart rate, breathing rate, and blood oxygen level will be monitored until you  leave the hospital or clinic. You will be encouraged to walk as soon as possible. You will also use a device or do breathing exercises to keep your lungs clear. You may have to wear compression stockings. These stockings help to prevent blood clots and reduce swelling in your legs. You will be given pain medicine as needed. You will need to wear a sanitary pad for vaginal discharge or bleeding. Summary A vaginal hysterectomy is a procedure to remove all or part of the uterus through the vagina. You may need a vaginal hysterectomy to treat a variety of abnormalities of the uterus. Plan to have a responsible adult take you home from the hospital or clinic. Plan to have a responsible adult care for you for the time you are told after you leave the hospital or clinic. This is important. This information is not intended to replace advice given to you by your health care provider. Make sure you discuss any questions you have with your health care provider. Document Revised: 08/03/2021 Document Reviewed: 01/23/2020 Elsevier Patient Education  [place].

## 2022-06-30 NOTE — Telephone Encounter (Signed)
Informed patient to come in somwtime next week to resign hysterectomy consent. Patient verberlized understanding.   B'Aisha, CMA

## 2022-06-30 NOTE — Progress Notes (Signed)
Ms Patchell presents for discussion in regards to hysterectomy due to [facility] and uterine fibroids H/O small uterine fibroids on 3/21 U/S , vol 133. H/O SVD x 3 ( largest 6 # 9 oz) Last pap normal except for HPV + H/O TTP  PE AF VSS Chaperone present  Lungs clear Heart RRR Abd soft +Bs GU Nl EGBUS, uterus , < 10 weeks, mobile, non tender, no masses  A/P [facility]        Uterine fibroids  TVH with possible BS reviewed with pt. R/B/Post op care discussed. Information provided Hysterectomy papers signed. Will check GYN U/S since last one was 3/21, if no significant change or new findings, will proceed with scheduling. F/U with post op appt.

## 2022-07-03 ENCOUNTER — Ambulatory Visit (HOSPITAL_COMMUNITY)
Admission: RE | Admit: 2022-07-03 | Discharge: 2022-07-03 | Disposition: A | Discharge status: Home / Self Care | Payer: Medicaid Other | Source: Ambulatory Visit | Attending: Obstetrics and Gynecology | Admitting: Obstetrics and Gynecology

## 2022-07-03 DIAGNOSIS — D219 Benign neoplasm of connective and other soft tissue, unspecified: Secondary | ICD-10-CM | POA: Diagnosis not present

## 2022-07-03 DIAGNOSIS — N920 Excessive and frequent menstruation with regular cycle: Secondary | ICD-10-CM

## 2022-07-04 ENCOUNTER — Other Ambulatory Visit: Payer: Self-pay

## 2022-07-04 ENCOUNTER — Inpatient Hospital Stay: Payer: Medicaid Other

## 2022-07-04 ENCOUNTER — Inpatient Hospital Stay (HOSPITAL_BASED_OUTPATIENT_CLINIC_OR_DEPARTMENT_OTHER): Payer: Medicaid Other | Admitting: Hematology

## 2022-07-04 VITALS — BP 123/88 | HR 98 | Temp 97.6°F | Resp 18 | Wt 234.9 lb

## 2022-07-04 DIAGNOSIS — Z79899 Other long term (current) drug therapy: Secondary | ICD-10-CM | POA: Diagnosis not present

## 2022-07-04 DIAGNOSIS — D509 Iron deficiency anemia, unspecified: Secondary | ICD-10-CM

## 2022-07-04 DIAGNOSIS — M3119 Other thrombotic microangiopathy: Secondary | ICD-10-CM | POA: Diagnosis not present

## 2022-07-04 LAB — CBC WITH DIFFERENTIAL (CANCER CENTER ONLY)
Abs Immature Granulocytes: 0.02 10*3/uL (ref 0.00–0.07)
Basophils Absolute: 0 10*3/uL (ref 0.0–0.1)
Basophils Relative: 0 %
Eosinophils Absolute: 0.1 10*3/uL (ref 0.0–0.5)
Eosinophils Relative: 1 %
HCT: 32.8 % — ABNORMAL LOW (ref 36.0–46.0)
Hemoglobin: 11.2 g/dL — ABNORMAL LOW (ref 12.0–15.0)
Immature Granulocytes: 0 %
Lymphocytes Relative: 44 %
Lymphs Abs: 3.9 10*3/uL (ref 0.7–4.0)
MCH: 27.9 pg (ref 26.0–34.0)
MCHC: 34.1 g/dL (ref 30.0–36.0)
MCV: 81.6 fL (ref 80.0–100.0)
Monocytes Absolute: 0.4 10*3/uL (ref 0.1–1.0)
Monocytes Relative: 4 %
Neutro Abs: 4.4 10*3/uL (ref 1.7–7.7)
Neutrophils Relative %: 51 %
Platelet Count: 330 10*3/uL (ref 150–400)
RBC: 4.02 MIL/uL (ref 3.87–5.11)
RDW: 14.5 % (ref 11.5–15.5)
WBC Count: 8.8 10*3/uL (ref 4.0–10.5)
nRBC: 0 % (ref 0.0–0.2)

## 2022-07-04 LAB — CMP (CANCER CENTER ONLY)
ALT: 10 U/L (ref 0–44)
AST: 10 U/L — ABNORMAL LOW (ref 15–41)
Albumin: 3.5 g/dL (ref 3.5–5.0)
Alkaline Phosphatase: 49 U/L (ref 38–126)
Anion gap: 5 (ref 5–15)
BUN: 17 mg/dL (ref 6–20)
CO2: 24 mmol/L (ref 22–32)
Calcium: 8.6 mg/dL — ABNORMAL LOW (ref 8.9–10.3)
Chloride: 108 mmol/L (ref 98–111)
Creatinine: 0.88 mg/dL (ref 0.44–1.00)
GFR, Estimated: >60 mL/min (ref >60)
Glucose, Bld: 118 mg/dL — ABNORMAL HIGH (ref 70–99)
Potassium: 3.3 mmol/L — ABNORMAL LOW (ref 3.5–5.1)
Sodium: 137 mmol/L (ref 135–145)
Total Bilirubin: 0.2 mg/dL — ABNORMAL LOW (ref 0.3–1.2)
Total Protein: 7.6 g/dL (ref 6.5–8.1)

## 2022-07-04 LAB — IRON AND IRON BINDING CAPACITY (CC-WL,HP ONLY)
Iron: 39 ug/dL (ref 28–170)
Saturation Ratios: 11 % (ref 10.4–31.8)
TIBC: 365 ug/dL (ref 250–450)
UIBC: 326 ug/dL (ref 148–442)

## 2022-07-04 LAB — FERRITIN: Ferritin: 17 ng/mL (ref 11–307)

## 2022-07-04 NOTE — Progress Notes (Signed)
HEMATOLOGY/ONCOLOGY CLINIC VISIT NOTE  Date of Service: 07/04/22    Patient Care Team: Dorna Mai, MD as PCP - General (Family Medicine) Tobe Sos, PA-C as Consulting Physician (Dermatology)  CHIEF COMPLAINTS/PURPOSE OF CONSULTATION:  Follow-up for continued evaluation and management of TTP  HISTORY OF PRESENTING ILLNESS:    INTERVAL HISTORY:    Sarah Washington for follow-up of her history of TTP.  She notes no acute new symptoms since her last clinic visit.   Patient was last seen by me on 01/23/2022 and she was doing fairly well.   Patient reports she has been doing fairly well since our last visit. She denies of any new medical concerns. Patient is slowly trying to grips with her son's demise. She does not visit her psychological counselors.  She currently denies any suicidal or homicidal ideation.   Patient notes she is currently taking anti-depressant medication, but is unsure about the name. She notes her anti-depressants are helping her.   She denies fever, chills, night sweats, new infection issues, unexpected weight loss, back pain, chest pain, abdominal pain, or leg swelling. However, she complains of mild fatigue due to iron deficient.   She occasionally takes iron polysaccharide 150 mg.   She reports that she is going to have hysterectomy soon, due to heave menstrual cycle.   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    SURGICAL HISTORY: Past Surgical History:  Procedure Laterality Date   TUBAL LIGATION    tubal ligation in 2010  SOCIAL HISTORY: Social History   Socioeconomic History   Marital status: Single    Spouse name: Not on file   Number of children: 3   Years of education: Not on file   Highest education level: Not on file  Occupational History   Not on file  Tobacco Use   Smoking status: Some Days    Types: Cigarettes    Last attempt to quit:  10/2017    Years since quitting: 4.7    Passive exposure: Current   Smokeless tobacco: Never  Vaping Use   Vaping Use: Never used  Substance and Sexual Activity   Alcohol use: Yes   Drug use: Never   Sexual activity: Yes    Birth control/protection: Surgical  Other Topics Concern   Not on file  Social History Narrative   Not on file   Social Determinants of Health   Financial Resource Strain: Not on file  Food Insecurity: Not on file  Transportation Needs: Not on file  Physical Activity: Not on file  Stress: Not on file  Social Connections: Not on file  Intimate Partner Violence: Not on file    FAMILY HISTORY: Family History  Problem Relation Age of Onset   Hypertension Mother    Diabetes Mother    Diabetes Father    Hypertension Father    Mental illness Father    Arthritis Maternal Grandmother    Cancer Maternal Grandmother    Diabetes Maternal Grandmother    Hypertension Maternal Grandmother    Stroke Maternal Grandmother    Breast cancer Maternal Grandmother    Stroke Maternal Grandfather    Arthritis Paternal Grandmother    Cancer Paternal Grandmother    Diabetes Paternal Grandmother    Hypertension Paternal Grandmother    Breast cancer Paternal Grandmother     ALLERGIES:  is allergic to sulfa antibiotics.  MEDICATIONS:  Current Outpatient Medications  Medication Sig Dispense  Refill   amLODipine (NORVASC) 2.5 MG tablet Take 1 tablet (2.5 mg total) by mouth daily. 90 tablet 3   cholecalciferol (VITAMIN D3) 25 MCG (1000 UNIT) tablet Take 1,000 Units by mouth daily.     hydrochlorothiazide (HYDRODIURIL) 25 MG tablet Take 1 tablet (25 mg total) by mouth daily. 90 tablet 3   hydrOXYzine (VISTARIL) 25 MG capsule Take 1 capsule (25 mg total) by mouth every 8 (eight) hours as needed. 30 capsule 1   Multiple Vitamin (MULTIVITAMIN WITH MINERALS) TABS tablet Take 1 tablet by mouth daily.     pantoprazole (PROTONIX) 40 MG tablet TAKE 1 TABLET(40 MG) BY MOUTH DAILY  AS NEEDED FOR GERD 30 tablet 1   sertraline (ZOLOFT) 25 MG tablet Take 1 tablet (25 mg total) by mouth daily. 30 tablet 2   sertraline (ZOLOFT) 50 MG tablet Take 1 tablet (50 mg total) by mouth daily. 30 tablet 1   No current facility-administered medications for this visit.    REVIEW OF SYSTEMS:   10 Point review of Systems was done is negative except as noted above.  PHYSICAL EXAMINATION: ECOG FS:1 - Symptomatic but completely ambulatory  Vitals:   07/04/22 1454  BP: 123/88  Pulse: 98  Resp: 18  Temp: 97.6 F (36.4 C)  SpO2: 100%     Wt Readings from Last 3 Encounters:  06/30/22 233 lb 11.2 oz (106 kg)  05/01/22 225 lb 9.6 oz (102.3 kg)  04/21/22 231 lb 6.4 oz (105 kg)   Body mass index is 36.79 kg/m.    NAD GENERAL:alert, in no acute distress and comfortable SKIN: no acute rashes, no significant lesions EYES: conjunctiva are pink and non-injected, sclera anicteric OROPHARYNX: MMM, no exudates, no oropharyngeal erythema or ulceration NECK: supple, no JVD LYMPH:  no palpable lymphadenopathy in the cervical, axillary or inguinal regions LUNGS: clear to auscultation b/l with normal respiratory effort HEART: regular rate & rhythm ABDOMEN:  normoactive bowel sounds , non tender, not distended. Extremity: no pedal edema PSYCH: alert & oriented x 3 with fluent speech NEURO: no focal motor/sensory deficits   LABORATORY DATA:  I have reviewed the data as listed .    Latest Ref Rng & Units 07/04/2022    2:39 PM 01/23/2022    8:56 AM 08/30/2021    9:49 AM  CBC  WBC 4.0 - 10.5 K/uL 8.8  7.3  8.3   Hemoglobin 12.0 - 15.0 g/dL 11.2  12.3  10.9   Hematocrit 36.0 - 46.0 % 32.8  36.1  34.0   Platelets 150 - 400 K/uL 330  330  356       Latest Ref Rng & Units 07/04/2022    2:39 PM 01/23/2022    8:56 AM 08/30/2021    9:49 AM  CMP  Glucose 70 - 99 mg/dL 118  95  91   BUN 6 - 20 mg/dL '17  10  16   '$ Creatinine 0.44 - 1.00 mg/dL 0.88  0.87  0.90   Sodium 135 - 145 mmol/L 137   137  137   Potassium 3.5 - 5.1 mmol/L 3.3  4.0  3.8   Chloride 98 - 111 mmol/L 108  107  106   CO2 22 - 32 mmol/L '24  26  27   '$ Calcium 8.9 - 10.3 mg/dL 8.6  9.1  8.4   Total Protein 6.5 - 8.1 g/dL 7.6  7.8  7.4   Total Bilirubin 0.3 - 1.2 mg/dL 0.2  0.3  0.3  Alkaline Phos 38 - 126 U/L 49  51  49   AST 15 - 41 U/L '10  11  10   '$ ALT 0 - 44 U/L '10  9  11    '$ . Lab Results  Component Value Date   IRON 39 07/04/2022   TIBC 365 07/04/2022   IRONPCTSAT 11 07/04/2022   (Iron and TIBC)  Lab Results  Component Value Date   FERRITIN 17 07/04/2022      RADIOGRAPHIC STUDIES: I have personally reviewed the radiological images as listed and agreed with the findings in the report. US PELVIC COMPLETE WITH TRANSVAGINAL  Result Date: 07/04/2022 CLINICAL DATA:  Fibroids. EXAM: TRANSABDOMINAL AND TRANSVAGINAL ULTRASOUND OF PELVIS TECHNIQUE: Both transabdominal and transvaginal ultrasound examinations of the pelvis were performed. Transabdominal technique was performed for global imaging of the pelvis including uterus, ovaries, adnexal regions, and pelvic cul-de-sac. It was necessary to proceed with endovaginal exam following the transabdominal exam to visualize the uterus and ovaries. COMPARISON:  August 15, 2019 FINDINGS: Uterus Measurements: 9.6 x 4.9 x 5.3 cm = volume: 128.1 mL. There is a 1.3 x 2.2 x 1.4 cm uterine fibroid in the anterior fundal myometrium. Endometrium Thickness: 1.3 cm.  No focal abnormality visualized. Right ovary Measurements: 3.2 x 5.1 x 2.9 cm = volume: 24.9 mL. Normal appearance/no adnexal mass. Left ovary Measurements: 2.4 x 2.6 x 1.9 cm = volume: 6.1 mL. Normal appearance/no adnexal mass. Other findings No abnormal free fluid. IMPRESSION: 2.2 cm uterine fibroid. Electronically Signed   By: Abelardo Diesel M.D.   On: 07/04/2022 08:10    ASSESSMENT & PLAN:   37 y.o. female with hydradenitis suppurativa and   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   Severely low ADAMTS 13 levels of 6 on diagnosis. Now normalized to 69 ( ADAMTS13 from 11/26/17 ) S/p Plasmapheresis x 5. Completed Rituxan weekly x 4 doses. On labs today patient has no evidence of TTP relapse at this time.  Normal platelets of 356. Adam TS 13 activity level of 53%  2.  Anemia-microcytic.  Likely due to iron deficiency.   PLAN: -Discussed lab results from today, 07/04/2022, with the patient. CBC shows hemoglobin of 11.2 and hematocrit of 32.8. CMP stable. -Patient is still iron deficient.  -Patient has no lab or clinical evidence of TTP relapse at this time. -discussed the option of IV Iron before her hysterectomy. Patient agrees to get an IV Iron.  -Continue iron polysaccharide 150 mg p.o. daily to continue to address iron deficiency from heavy periods -Patient notes her depression has improved and she has no suicidal or homicidal ideation.   FOLLOW-UP: RTC with Dr Irene Limbo with labs in 6 months  The total time spent in the appointment was 20 minutes* .  All of the patient's questions were answered with apparent satisfaction. The patient knows to call the clinic with any problems, questions or concerns.   Sullivan Lone MD MS AAHIVMS [facility] Hematology/Oncology Physician [facility]  .*Total Encounter Time as defined by the Centers for Medicare and Medicaid Services includes, in addition to the face-to-face time of a patient visit (documented in the note above) non-face-to-face time: obtaining and reviewing outside history, ordering and reviewing medications, tests or procedures, care coordination (communications with other health care professionals or caregivers) and documentation in the medical record.   I, Cleda Mccreedy, am acting as a Education administrator for Sullivan Lone, MD. .I have reviewed the above documentation for accuracy and completeness, and I agree  with the above. Brunetta Genera MD

## 2022-07-06 ENCOUNTER — Encounter: Payer: Self-pay | Admitting: *Deleted

## 2022-07-10 ENCOUNTER — Encounter: Payer: Self-pay | Admitting: Hematology

## 2022-07-12 ENCOUNTER — Other Ambulatory Visit: Payer: Self-pay | Admitting: Hematology

## 2022-07-24 ENCOUNTER — Inpatient Hospital Stay: Payer: Medicaid Other | Attending: Hematology

## 2022-07-24 VITALS — BP 143/99 | HR 91 | Temp 98.8°F | Resp 20

## 2022-07-24 DIAGNOSIS — D509 Iron deficiency anemia, unspecified: Secondary | ICD-10-CM | POA: Diagnosis present

## 2022-07-24 MED ORDER — SODIUM CHLORIDE 0.9 % IV SOLN
300.0000 mg | Freq: Once | INTRAVENOUS | Status: AC
  Administered 2022-07-24: 300 mg via INTRAVENOUS
  Filled 2022-07-24: qty 10

## 2022-07-24 MED ORDER — ACETAMINOPHEN 325 MG PO TABS
650.0000 mg | ORAL_TABLET | Freq: Once | ORAL | Status: AC
  Administered 2022-07-24: 650 mg via ORAL
  Filled 2022-07-24: qty 2

## 2022-07-24 MED ORDER — SODIUM CHLORIDE 0.9 % IV SOLN: Freq: Once | INTRAVENOUS | Status: AC

## 2022-07-24 MED ORDER — LORATADINE 10 MG PO TABS
10.0000 mg | ORAL_TABLET | Freq: Once | ORAL | Status: AC
  Administered 2022-07-24: 10 mg via ORAL
  Filled 2022-07-24: qty 1

## 2022-07-24 NOTE — Patient Instructions (Signed)

## 2022-07-24 NOTE — Progress Notes (Signed)
Patient stayed 15 minutes after iron infusion, declined to stay for 30 minutes.  VS stable.

## 2022-07-31 ENCOUNTER — Other Ambulatory Visit: Payer: Self-pay | Admitting: Hematology

## 2022-07-31 ENCOUNTER — Inpatient Hospital Stay: Payer: Medicaid Other

## 2022-07-31 ENCOUNTER — Other Ambulatory Visit: Payer: Self-pay

## 2022-07-31 VITALS — BP 126/87 | HR 97 | Temp 99.0°F | Resp 18

## 2022-07-31 DIAGNOSIS — D509 Iron deficiency anemia, unspecified: Secondary | ICD-10-CM | POA: Diagnosis not present

## 2022-07-31 MED ORDER — LORATADINE 10 MG PO TABS
10.0000 mg | ORAL_TABLET | Freq: Once | ORAL | Status: AC
  Administered 2022-07-31: 10 mg via ORAL
  Filled 2022-07-31: qty 1

## 2022-07-31 MED ORDER — SODIUM CHLORIDE 0.9 % IV SOLN: Freq: Once | INTRAVENOUS | Status: AC

## 2022-07-31 MED ORDER — ACETAMINOPHEN 325 MG PO TABS
650.0000 mg | ORAL_TABLET | Freq: Once | ORAL | Status: AC
  Administered 2022-07-31: 650 mg via ORAL
  Filled 2022-07-31: qty 2

## 2022-07-31 MED ORDER — SODIUM CHLORIDE 0.9 % IV SOLN
300.0000 mg | Freq: Once | INTRAVENOUS | Status: AC
  Administered 2022-07-31: 300 mg via INTRAVENOUS
  Filled 2022-07-31: qty 300

## 2022-07-31 NOTE — Patient Instructions (Signed)

## 2022-08-21 ENCOUNTER — Encounter (HOSPITAL_COMMUNITY): Payer: Self-pay | Admitting: Obstetrics and Gynecology

## 2022-08-21 NOTE — Progress Notes (Signed)
PCP - Dr Dorna Mai Cardiologist - n/a  Chest x-ray - n/a EKG - 08/22/22 Stress Test - n/a ECHO - n/a Cardiac Cath - n/a  ICD Pacemaker/Loop - n/a  Sleep Study -  n/a CPAP - none  Diabetes - n/a  Blood Thinner Instructions:  n/a  Aspirin Instructions: n/a  ERAS: Clear liquids til 9:10 AM DOS  Anesthesia review: Yes  STOP now taking any Aspirin (unless otherwise instructed by your surgeon), Aleve, Naproxen, Ibuprofen, Motrin, Advil, Goody's, BC's, all herbal medications, fish oil, and all vitamins.   Coronavirus Screening Do you have any of the following symptoms:  Cough yes/no: No Fever (>100.76F)  yes/no: No Runny nose yes/no: No Sore throat yes/no: No Difficulty breathing/shortness of breath  yes/no: No  Have you traveled in the last 14 days and where? yes/no: No  Patient verbalized understanding of instructions that were given to them at the PAT appointment. Patient was also instructed that they will need to review over the PAT instructions again at home before surgery.

## 2022-08-21 NOTE — Progress Notes (Signed)
error 

## 2022-08-21 NOTE — Pre-Procedure Instructions (Signed)
Surgical Instructions    Your procedure is scheduled on Tuesday, March 26.  Report to [facility] Main Entrance "A" at 10:10 A.M., then check in with the Admitting office.  Call this number if you have problems the morning of surgery:  [phone]   If you have any questions prior to your surgery date call [phone]: Open Monday-Friday 8am-4pm If you experience any cold or flu symptoms such as cough, fever, chills, shortness of breath, etc. between now and your scheduled surgery, please notify us at the above number     Remember:  Do not eat after midnight the night before your surgery  You may drink clear liquids until 9:10AM the morning of your surgery.   Clear liquids allowed are: Water, Non-Citrus Juices (without pulp), Carbonated Beverages, Clear Tea, Black Coffee ONLY (NO MILK, CREAM OR POWDERED CREAMER of any kind), and Gatorade    Take these medicines the morning of surgery with A SIP OF WATER:  amLODipine (NORVASC)  sertraline (ZOLOFT)   If needed: hydrOXYzine (VISTARIL)  pantoprazole (PROTONIX)    As of today, STOP taking any Aspirin (unless otherwise instructed by your surgeon) Aleve, Naproxen, Ibuprofen, Motrin, Advil, Goody's, BC's, all herbal medications, fish oil, and all vitamins.   [facility] is not responsible for any belongings or valuables.    Do NOT Smoke (Tobacco/Vaping)  24 hours prior to your procedure  If you use a CPAP at night, you may bring your mask for your overnight stay.   Contacts, glasses, hearing aids, dentures or partials may not be worn into surgery, please bring cases for these belongings   For patients admitted to the hospital, discharge time will be determined by your treatment team.   Patients discharged the day of surgery will not be allowed to drive home, and someone needs to stay with them for 24 hours.   SURGICAL WAITING ROOM VISITATION Patients having surgery or a procedure may have no more than 2 support people in the  waiting area - these visitors may rotate.   Children under the age of 34 must have an adult with them who is not the patient. If the patient needs to stay at the hospital during part of their recovery, the visitor guidelines for inpatient rooms apply. Pre-op nurse will coordinate an appropriate time for 1 support person to accompany patient in pre-op.  This support person may not rotate.   Please refer to [email or web address] for the visitor guidelines for Inpatients (after your surgery is over and you are in a regular room).    Special instructions:    Oral Hygiene is also important to reduce your risk of infection.  Remember - BRUSH YOUR TEETH THE MORNING OF SURGERY WITH YOUR REGULAR TOOTHPASTE   - Preparing For Surgery  Before surgery, you can play an important role. Because skin is not sterile, your skin needs to be as free of germs as possible. You can reduce the number of germs on your skin by washing with CHG (chlorahexidine gluconate) Soap before surgery.  CHG is an antiseptic cleaner which kills germs and bonds with the skin to continue killing germs even after washing.     Please do not use if you have an allergy to CHG or antibacterial soaps. If your skin becomes reddened/irritated stop using the CHG.  Do not shave (including legs and underarms) for at least 48 hours prior to first CHG shower. It is OK to shave your face.  Please follow these instructions carefully.  Shower the NIGHT BEFORE SURGERY and the MORNING OF SURGERY with CHG Soap.   If you chose to wash your hair, wash your hair first as usual with your normal shampoo. After you shampoo, rinse your hair and body thoroughly to remove the shampoo.  Then [employer] and genitals (private parts) with your normal soap and rinse thoroughly to remove soap.  After that Use CHG Soap as you would any other liquid soap. You can apply CHG directly to the skin and wash  gently with a scrungie or a clean washcloth.   Apply the CHG Soap to your body ONLY FROM THE NECK DOWN.  Do not use on open wounds or open sores. Avoid contact with your eyes, ears, mouth and genitals (private parts). Wash Face and genitals (private parts)  with your normal soap.   Wash thoroughly, paying special attention to the area where your surgery will be performed.  Thoroughly rinse your body with warm water from the neck down.  DO NOT shower/wash with your normal soap after using and rinsing off the CHG Soap.  Pat yourself dry with a CLEAN TOWEL.  Wear CLEAN PAJAMAS to bed the night before surgery  Place CLEAN SHEETS on your bed the night before your surgery  DO NOT SLEEP WITH PETS.   Day of Surgery:  Take a shower with CHG soap. Wear Clean/Comfortable clothing the morning of surgery Do not wear jewelry or makeup. Do not wear lotions, powders, perfumes/cologne or deodorant. Do not shave 48 hours prior to surgery.  Men may shave face and neck. Do not bring valuables to the hospital. Do not wear nail polish, gel polish, artificial nails, or any other type of covering on natural nails (fingers and toes) If you have artificial nails or gel coating that need to be removed by a nail salon, please have this removed prior to surgery. Artificial nails or gel coating may interfere with anesthesia's ability to adequately monitor your vital signs.   Remember to brush your teeth WITH YOUR REGULAR TOOTHPASTE.    If you received a COVID test during your pre-op visit, it is requested that you wear a mask when out in public, stay away from anyone that may not be feeling well, and notify your surgeon if you develop symptoms. If you have been in contact with anyone that has tested positive in the last 10 days, please notify your surgeon.    Please read over the following fact sheets that you were given.

## 2022-08-22 ENCOUNTER — Encounter (HOSPITAL_COMMUNITY): Payer: Self-pay

## 2022-08-22 ENCOUNTER — Encounter (HOSPITAL_COMMUNITY)
Admission: RE | Admit: 2022-08-22 | Discharge: 2022-08-22 | Disposition: A | Discharge status: Home / Self Care | Payer: Medicaid Other | Source: Ambulatory Visit | Attending: Obstetrics and Gynecology | Admitting: Obstetrics and Gynecology

## 2022-08-22 ENCOUNTER — Other Ambulatory Visit: Payer: Self-pay

## 2022-08-22 VITALS — BP 139/89 | HR 88 | Temp 98.6°F | Resp 17 | Ht 67.0 in | Wt 234.2 lb

## 2022-08-22 DIAGNOSIS — F32A Depression, unspecified: Secondary | ICD-10-CM | POA: Insufficient documentation

## 2022-08-22 DIAGNOSIS — I10 Essential (primary) hypertension: Secondary | ICD-10-CM | POA: Insufficient documentation

## 2022-08-22 DIAGNOSIS — Z6836 Body mass index (BMI) 36.0-36.9, adult: Secondary | ICD-10-CM | POA: Insufficient documentation

## 2022-08-22 DIAGNOSIS — D259 Leiomyoma of uterus, unspecified: Secondary | ICD-10-CM | POA: Diagnosis not present

## 2022-08-22 DIAGNOSIS — Z01818 Encounter for other preprocedural examination: Secondary | ICD-10-CM | POA: Insufficient documentation

## 2022-08-22 DIAGNOSIS — N920 Excessive and frequent menstruation with regular cycle: Secondary | ICD-10-CM | POA: Diagnosis not present

## 2022-08-22 DIAGNOSIS — F1290 Cannabis use, unspecified, uncomplicated: Secondary | ICD-10-CM | POA: Insufficient documentation

## 2022-08-22 DIAGNOSIS — F419 Anxiety disorder, unspecified: Secondary | ICD-10-CM | POA: Insufficient documentation

## 2022-08-22 DIAGNOSIS — E039 Hypothyroidism, unspecified: Secondary | ICD-10-CM | POA: Insufficient documentation

## 2022-08-22 DIAGNOSIS — E669 Obesity, unspecified: Secondary | ICD-10-CM | POA: Diagnosis not present

## 2022-08-22 HISTORY — DX: Other psychoactive substance abuse, uncomplicated: F19.10

## 2022-08-22 HISTORY — DX: Personal history of other medical treatment: Z92.89

## 2022-08-22 HISTORY — DX: Hypothyroidism, unspecified: E03.9

## 2022-08-22 LAB — CBC
HCT: 37.3 % (ref 36.0–46.0)
Hemoglobin: 12.1 g/dL (ref 12.0–15.0)
MCH: 27.6 pg (ref 26.0–34.0)
MCHC: 32.4 g/dL (ref 30.0–36.0)
MCV: 85 fL (ref 80.0–100.0)
Platelets: 321 10*3/uL (ref 150–400)
RBC: 4.39 MIL/uL (ref 3.87–5.11)
RDW: 14.6 % (ref 11.5–15.5)
WBC: 9.4 10*3/uL (ref 4.0–10.5)
nRBC: 0 % (ref 0.0–0.2)

## 2022-08-22 LAB — BASIC METABOLIC PANEL
Anion gap: 8 (ref 5–15)
BUN: 13 mg/dL (ref 6–20)
CO2: 28 mmol/L (ref 22–32)
Calcium: 9 mg/dL (ref 8.9–10.3)
Chloride: 101 mmol/L (ref 98–111)
Creatinine, Ser: 0.9 mg/dL (ref 0.44–1.00)
GFR, Estimated: >60 mL/min (ref >60)
Glucose, Bld: 91 mg/dL (ref 70–99)
Potassium: 3.1 mmol/L — ABNORMAL LOW (ref 3.5–5.1)
Sodium: 137 mmol/L (ref 135–145)

## 2022-08-22 LAB — TYPE AND SCREEN
ABO/RH(D): B POS
Antibody Screen: NEGATIVE

## 2022-08-22 NOTE — Progress Notes (Signed)
Called Medication Rec phone twice during the PAT appointment but the line kept ringing, ringing with no answer.  Patient was given the slip of paper with Pharmacy Call Center information where she can call them with her current medications.

## 2022-08-23 NOTE — H&P (Signed)
Sarah Washington is an 37 y.o. female, G3P3 with [facility] due to uterine fibroids GYN U/S, uterine fibroids, vol 128 gms  H/O TSVD x 3 ( 6 # 9 0z)   Menstrual History: Menarche age: 32 Patient's last menstrual period was 08/19/2022 (exact date).    Past Medical History:  Diagnosis Date   Anxiety and depression    Fibroids 06/2022   uterine fibroids   History of blood transfusion    Iron infusions - last on on 07/31/22   Hypertension    Hypothyroidism    hx in past - yrs ago- no current problems, no meds   Normocytic anemia    Substance abuse ([place])    marijuana   TTP (thrombotic thrombocytopenic purpura) ([place]) 06/2022    Past Surgical History:  Procedure Laterality Date   boils removed Bilateral    x 2 - groin area - one on rright and one on left with anesthesia   TUBAL LIGATION      Family History  Problem Relation Age of Onset   Hypertension Mother    Diabetes Mother    Diabetes Father    Hypertension Father    Mental illness Father    Arthritis Maternal Grandmother    Cancer Maternal Grandmother    Diabetes Maternal Grandmother    Hypertension Maternal Grandmother    Stroke Maternal Grandmother    Breast cancer Maternal Grandmother    Stroke Maternal Grandfather    Arthritis Paternal Grandmother    Cancer Paternal Grandmother    Diabetes Paternal Grandmother    Hypertension Paternal Grandmother    Breast cancer Paternal Grandmother     Social History:  reports that she has never smoked. She has been exposed to tobacco smoke. She has never used smokeless tobacco. She reports current alcohol use. She reports current drug use. Drug: Marijuana.  Allergies:  Allergies  Allergen Reactions   Sulfa Antibiotics Hives    Bactrim    No medications prior to admission.    Review of Systems  Constitutional: Negative.   Respiratory: Negative.    Cardiovascular: Negative.   Gastrointestinal: Negative.   Genitourinary:  Positive for menstrual problem.    Last  menstrual period 08/19/2022. Physical Exam Constitutional:      Appearance: Normal appearance.  Cardiovascular:     Rate and Rhythm: Normal rate and regular rhythm.     Heart sounds: Normal heart sounds.  Pulmonary:     Effort: Pulmonary effort is normal.     Breath sounds: Normal breath sounds.  Abdominal:     General: Bowel sounds are normal.     Palpations: Abdomen is soft.  Genitourinary:    Comments: Nl EGBUS, uterus, @ 8 weeks, mobile, non tender, no masses Neurological:     Mental Status: She is alert.     No results found for this or any previous visit (from the past 24 hour(s)).  No results found.  Assessment/Plan: [facility] Uterine fibroids  Pt desires definite therapy. TVH with possible salpingectomy reviewed with pt. R/B/Post op care reviewed. Pt has verbalized understanding and desires to proceed.  Chancy Milroy 08/23/2022, 10:45 AM

## 2022-08-23 NOTE — Progress Notes (Signed)
Anesthesia Chart Review:  Case: [id number] Date/Time: 08/29/22 1155   Procedure: HYSTERECTOMY VAGINAL WITH SALPINGECTOMY (Bilateral)   Anesthesia type: Choice   Pre-op diagnosis:      Menorrhagia     Fibroids   Location: [facility] OR ROOM 07 / [place] OR   Surgeons: Chancy Milroy, MD       DISCUSSION: Patient is a 37 year old female scheduled for the above procedure.  History includes never smoker (with passive smoking exposure), HTN, thrombotic thrombocytopenic purpura (TTP, 11/2017, s/p plasmapheresis x5, Rituxan x4), normocytic anemia, hypothyroidism (previously, now resolved), uterine fibroids, anxiety, depression, tubal ligation (2010). +Marijuana use. BMI is consistent with obesity.    She last saw hematologist Dr. Irene Limbo on 07/04/22. He noted plans for surgery. By recent labs and exam that day, she had no lab or clinical evidence of TTP relapse: normal PLT of 330, H/H 11.2/32.8. Adam TS 13 activity level of 53% on 08/29/21. HE did advise IV iron prior to hysterectomy, given 07/24/22. Follow-up in six month planned.   Labs on 08/22/22 showed normal CBC and BMET except for K 3.1. H/H 12.1/37.3. She is for urine pregnancy test on the day of surgery. Dr. Marjory Lies orders also include to repeat the CBC if last result will be > 72 hour old. T&S was already done.  Anesthesia team to evaluate on the day of surgery.     VS: BP 139/89   Pulse 88   Temp 37 C   Resp 17   Ht 5\' 7"  (1.702 m)   Wt 106.2 kg   LMP 08/19/2022 (Exact Date)   SpO2 99%   BMI 36.68 kg/m    PROVIDERS: Dorna Mai, MD is PCP Sullivan Lone, MD is HEM   LABS: Labs reviewed: Acceptable for surgery. AST 10, ALT 10 07/04/22.  (all labs ordered are listed, but only abnormal results are displayed)  Labs Reviewed  BASIC METABOLIC PANEL - Abnormal; Notable for the following components:      Result Value   Potassium 3.1 (*)    All other components within normal limits  CBC  TYPE AND SCREEN     EKG: 08/22/22: Normal sinus  rhythm Normal ECG When compared with ECG of 17-Jun-2018 20:36, PREVIOUS ECG IS PRESENT Since last tracing rate slower Confirmed by Charolette Forward ([id number]) on 08/22/2022 10:58:58 AM   CV: N/A  Past Medical History:  Diagnosis Date   Anxiety and depression    Fibroids 06/2022   uterine fibroids   History of blood transfusion    Iron infusions - last on on 07/31/22   Hypertension    Hypothyroidism    hx in past - yrs ago- no current problems, no meds   Normocytic anemia    Substance abuse ([facility])    marijuana   TTP (thrombotic thrombocytopenic purpura) ([place]) 06/2022    Past Surgical History:  Procedure Laterality Date   boils removed Bilateral    x 2 - groin area - one on rright and one on left with anesthesia   TUBAL LIGATION      MEDICATIONS:  iron polysaccharides (NIFEREX) 150 MG capsule   OVER THE COUNTER MEDICATION   amLODipine (NORVASC) 2.5 MG tablet   cholecalciferol (VITAMIN D3) 25 MCG (1000 UNIT) tablet   hydrochlorothiazide (HYDRODIURIL) 25 MG tablet   hydrOXYzine (VISTARIL) 25 MG capsule   Multiple Vitamin (MULTIVITAMIN WITH MINERALS) TABS tablet   pantoprazole (PROTONIX) 40 MG tablet   sertraline (ZOLOFT) 25 MG tablet   sertraline (ZOLOFT) 50 MG tablet  No current facility-administered medications for this encounter.     Myra Gianotti, PA-C Surgical Short Stay/Anesthesiology [facility] Phone [phone] [facility] Phone [phone] 08/24/2022 10:13 AM

## 2022-08-24 NOTE — Anesthesia Preprocedure Evaluation (Addendum)
Anesthesia Evaluation  Patient identified by MRN, date of birth, ID band Patient awake    Reviewed: Allergy & Precautions, NPO status , Patient's Chart, lab work & pertinent test results  Airway Mallampati: II  TM Distance: >3 FB Neck ROM: Full    Dental no notable dental hx. (+) Dental Advisory Given, Teeth Intact   Pulmonary neg pulmonary ROS, neg recent URI, Patient abstained from smoking.   Pulmonary exam normal breath sounds clear to auscultation       Cardiovascular hypertension, Pt. on medications Normal cardiovascular exam Rhythm:Regular Rate:Normal     Neuro/Psych  Headaches PSYCHIATRIC DISORDERS Anxiety Depression       GI/Hepatic negative GI ROS,,,(+)     substance abuse  alcohol use and marijuana use  Endo/Other  Hypothyroidism    Renal/GU negative Renal ROS     Musculoskeletal negative musculoskeletal ROS (+)    Abdominal  (+) + obese  Peds  Hematology  (+) Blood dyscrasia, anemia   Anesthesia Other Findings   Reproductive/Obstetrics negative OB ROS                             Anesthesia Physical Anesthesia Plan  ASA: 3  Anesthesia Plan: General   Post-op Pain Management: Tylenol PO (pre-op)*, Gabapentin PO (pre-op)* and Toradol IV (intra-op)*   Induction: Intravenous  PONV Risk Score and Plan: 4 or greater and Ondansetron, Dexamethasone, Treatment may vary due to age or medical condition and Midazolam  Airway Management Planned: Oral ETT  Additional Equipment:   Intra-op Plan:   Post-operative Plan: Extubation in OR  Informed Consent: I have reviewed the patients History and Physical, chart, labs and discussed the procedure including the risks, benefits and alternatives for the proposed anesthesia with the patient or authorized representative who has indicated his/her understanding and acceptance.     Dental advisory given  Plan Discussed with:  CRNA  Anesthesia Plan Comments: (PAT note written 08/24/2022 by Myra Gianotti, PA-C. History TTP in 2019.  )       Anesthesia Quick Evaluation

## 2022-08-29 ENCOUNTER — Observation Stay (HOSPITAL_COMMUNITY)
Admission: RE | Admit: 2022-08-29 | Discharge: 2022-08-30 | Disposition: A | Discharge status: Home / Self Care | Payer: Medicaid Other | Attending: Obstetrics and Gynecology | Admitting: Obstetrics and Gynecology

## 2022-08-29 ENCOUNTER — Observation Stay (HOSPITAL_BASED_OUTPATIENT_CLINIC_OR_DEPARTMENT_OTHER): Payer: Medicaid Other | Admitting: Certified Registered"

## 2022-08-29 ENCOUNTER — Other Ambulatory Visit: Payer: Self-pay

## 2022-08-29 ENCOUNTER — Observation Stay (HOSPITAL_COMMUNITY): Payer: Medicaid Other | Admitting: Vascular Surgery

## 2022-08-29 ENCOUNTER — Encounter (HOSPITAL_COMMUNITY)
Admission: RE | Disposition: A | Discharge status: Home / Self Care | Payer: Self-pay | Source: Home / Self Care | Attending: Obstetrics and Gynecology

## 2022-08-29 ENCOUNTER — Encounter (HOSPITAL_COMMUNITY): Payer: Self-pay | Admitting: Obstetrics and Gynecology

## 2022-08-29 DIAGNOSIS — I10 Essential (primary) hypertension: Secondary | ICD-10-CM | POA: Diagnosis not present

## 2022-08-29 DIAGNOSIS — Z9889 Other specified postprocedural states: Secondary | ICD-10-CM

## 2022-08-29 DIAGNOSIS — Z7722 Contact with and (suspected) exposure to environmental tobacco smoke (acute) (chronic): Secondary | ICD-10-CM | POA: Insufficient documentation

## 2022-08-29 DIAGNOSIS — D259 Leiomyoma of uterus, unspecified: Principal | ICD-10-CM | POA: Insufficient documentation

## 2022-08-29 DIAGNOSIS — D251 Intramural leiomyoma of uterus: Secondary | ICD-10-CM | POA: Diagnosis not present

## 2022-08-29 DIAGNOSIS — D638 Anemia in other chronic diseases classified elsewhere: Secondary | ICD-10-CM

## 2022-08-29 DIAGNOSIS — E039 Hypothyroidism, unspecified: Secondary | ICD-10-CM

## 2022-08-29 DIAGNOSIS — D630 Anemia in neoplastic disease: Secondary | ICD-10-CM | POA: Diagnosis not present

## 2022-08-29 DIAGNOSIS — N920 Excessive and frequent menstruation with regular cycle: Secondary | ICD-10-CM | POA: Diagnosis not present

## 2022-08-29 DIAGNOSIS — D219 Benign neoplasm of connective and other soft tissue, unspecified: Secondary | ICD-10-CM

## 2022-08-29 HISTORY — PX: CYST EXCISION: SHX5701

## 2022-08-29 HISTORY — PX: VAGINAL HYSTERECTOMY: SHX2639

## 2022-08-29 HISTORY — DX: Essential (primary) hypertension: I10

## 2022-08-29 LAB — BASIC METABOLIC PANEL
Anion gap: 9 (ref 5–15)
BUN: 10 mg/dL (ref 6–20)
CO2: 22 mmol/L (ref 22–32)
Calcium: 8.8 mg/dL — ABNORMAL LOW (ref 8.9–10.3)
Chloride: 104 mmol/L (ref 98–111)
Creatinine, Ser: 0.82 mg/dL (ref 0.44–1.00)
GFR, Estimated: >60 mL/min (ref >60)
Glucose, Bld: 90 mg/dL (ref 70–99)
Potassium: 3.3 mmol/L — ABNORMAL LOW (ref 3.5–5.1)
Sodium: 135 mmol/L (ref 135–145)

## 2022-08-29 LAB — CBC
HCT: 37.9 % (ref 36.0–46.0)
Hemoglobin: 12.7 g/dL (ref 12.0–15.0)
MCH: 27.9 pg (ref 26.0–34.0)
MCHC: 33.5 g/dL (ref 30.0–36.0)
MCV: 83.3 fL (ref 80.0–100.0)
Platelets: 342 10*3/uL (ref 150–400)
RBC: 4.55 MIL/uL (ref 3.87–5.11)
RDW: 14.7 % (ref 11.5–15.5)
WBC: 8.6 10*3/uL (ref 4.0–10.5)
nRBC: 0 % (ref 0.0–0.2)

## 2022-08-29 LAB — POCT PREGNANCY, URINE: Preg Test, Ur: NEGATIVE

## 2022-08-29 SURGERY — HYSTERECTOMY, VAGINAL
Anesthesia: General | Site: Vagina | Laterality: Bilateral

## 2022-08-29 MED ORDER — KETAMINE HCL 50 MG/5ML IJ SOSY
PREFILLED_SYRINGE | INTRAMUSCULAR | Status: AC
  Filled 2022-08-29: qty 5

## 2022-08-29 MED ORDER — AMLODIPINE BESYLATE 5 MG PO TABS
2.5000 mg | ORAL_TABLET | Freq: Every day | ORAL | Status: DC
  Administered 2022-08-29: 2.5 mg via ORAL
  Filled 2022-08-29: qty 1

## 2022-08-29 MED ORDER — DEXAMETHASONE SODIUM PHOSPHATE 10 MG/ML IJ SOLN
INTRAMUSCULAR | Status: AC
  Filled 2022-08-29: qty 1

## 2022-08-29 MED ORDER — HYDROCHLOROTHIAZIDE 25 MG PO TABS
25.0000 mg | ORAL_TABLET | Freq: Every day | ORAL | Status: DC
  Administered 2022-08-29: 25 mg via ORAL
  Filled 2022-08-29: qty 1

## 2022-08-29 MED ORDER — KETOROLAC TROMETHAMINE 30 MG/ML IJ SOLN
30.0000 mg | Freq: Four times a day (QID) | INTRAMUSCULAR | Status: DC
  Administered 2022-08-29 – 2022-08-30 (×3): 30 mg via INTRAVENOUS
  Filled 2022-08-29 (×4): qty 1

## 2022-08-29 MED ORDER — MIDAZOLAM HCL 2 MG/2ML IJ SOLN
INTRAMUSCULAR | Status: AC
  Filled 2022-08-29: qty 2

## 2022-08-29 MED ORDER — PROPOFOL 10 MG/ML IV BOLUS
INTRAVENOUS | Status: AC
  Filled 2022-08-29: qty 20

## 2022-08-29 MED ORDER — MIDAZOLAM HCL 2 MG/2ML IJ SOLN
INTRAMUSCULAR | Status: DC | PRN
  Administered 2022-08-29: 2 mg via INTRAVENOUS

## 2022-08-29 MED ORDER — OXYCODONE-ACETAMINOPHEN 5-325 MG PO TABS
1.0000 | ORAL_TABLET | ORAL | Status: DC | PRN
  Administered 2022-08-30: 1 via ORAL
  Filled 2022-08-29: qty 1

## 2022-08-29 MED ORDER — ACETAMINOPHEN 500 MG PO TABS
ORAL_TABLET | ORAL | Status: AC
  Administered 2022-08-29: 1000 mg via ORAL
  Filled 2022-08-29: qty 2

## 2022-08-29 MED ORDER — FENTANYL CITRATE (PF) 250 MCG/5ML IJ SOLN
INTRAMUSCULAR | Status: AC
  Filled 2022-08-29: qty 5

## 2022-08-29 MED ORDER — ONDANSETRON HCL 4 MG/2ML IJ SOLN
INTRAMUSCULAR | Status: AC
  Filled 2022-08-29: qty 2

## 2022-08-29 MED ORDER — KETOROLAC TROMETHAMINE 15 MG/ML IJ SOLN: 15.0000 mg | INTRAMUSCULAR | Status: AC

## 2022-08-29 MED ORDER — SUGAMMADEX SODIUM 200 MG/2ML IV SOLN
INTRAVENOUS | Status: DC | PRN
  Administered 2022-08-29: 200 mg via INTRAVENOUS

## 2022-08-29 MED ORDER — GABAPENTIN 300 MG PO CAPS
ORAL_CAPSULE | ORAL | Status: AC
  Administered 2022-08-29: 300 mg via ORAL
  Filled 2022-08-29: qty 1

## 2022-08-29 MED ORDER — SOD CITRATE-CITRIC ACID 500-334 MG/5ML PO SOLN: 30.0000 mL | ORAL | Status: AC

## 2022-08-29 MED ORDER — IBUPROFEN 800 MG PO TABS: 800.0000 mg | ORAL_TABLET | Freq: Three times a day (TID) | ORAL | Status: DC

## 2022-08-29 MED ORDER — PROPOFOL 10 MG/ML IV BOLUS
INTRAVENOUS | Status: DC | PRN
  Administered 2022-08-29: 170 mg via INTRAVENOUS

## 2022-08-29 MED ORDER — LIDOCAINE 2% (20 MG/ML) 5 ML SYRINGE
INTRAMUSCULAR | Status: AC
  Filled 2022-08-29: qty 5

## 2022-08-29 MED ORDER — LACTATED RINGERS IV SOLN: INTRAVENOUS | Status: DC

## 2022-08-29 MED ORDER — KETOROLAC TROMETHAMINE 15 MG/ML IJ SOLN
INTRAMUSCULAR | Status: DC | PRN
  Administered 2022-08-29: 15 mg via INTRAVENOUS

## 2022-08-29 MED ORDER — ONDANSETRON HCL 4 MG PO TABS: 4.0000 mg | ORAL_TABLET | Freq: Four times a day (QID) | ORAL | Status: DC | PRN

## 2022-08-29 MED ORDER — DEXAMETHASONE SODIUM PHOSPHATE 10 MG/ML IJ SOLN
INTRAMUSCULAR | Status: DC | PRN
  Administered 2022-08-29: 10 mg via INTRAVENOUS

## 2022-08-29 MED ORDER — ACETAMINOPHEN 500 MG PO TABS: 1000.0000 mg | ORAL_TABLET | Freq: Once | ORAL | Status: AC

## 2022-08-29 MED ORDER — HYDROMORPHONE HCL 1 MG/ML IJ SOLN
INTRAMUSCULAR | Status: AC
  Filled 2022-08-29: qty 1

## 2022-08-29 MED ORDER — SOD CITRATE-CITRIC ACID 500-334 MG/5ML PO SOLN
ORAL | Status: AC
  Administered 2022-08-29: 30 mL via ORAL
  Filled 2022-08-29: qty 30

## 2022-08-29 MED ORDER — CHLORHEXIDINE GLUCONATE 0.12 % MT SOLN
OROMUCOSAL | Status: AC
  Administered 2022-08-29: 15 mL
  Filled 2022-08-29: qty 15

## 2022-08-29 MED ORDER — ACETAMINOPHEN 500 MG PO TABS: 1000.0000 mg | ORAL_TABLET | ORAL | Status: AC

## 2022-08-29 MED ORDER — OXYCODONE HCL 5 MG PO TABS: 5.0000 mg | ORAL_TABLET | Freq: Once | ORAL | Status: DC | PRN

## 2022-08-29 MED ORDER — HYDROMORPHONE HCL 1 MG/ML IJ SOLN
0.2500 mg | INTRAMUSCULAR | Status: DC | PRN
  Administered 2022-08-29 (×4): 0.5 mg via INTRAVENOUS

## 2022-08-29 MED ORDER — ONDANSETRON HCL 4 MG/2ML IJ SOLN
INTRAMUSCULAR | Status: DC | PRN
  Administered 2022-08-29: 4 mg via INTRAVENOUS

## 2022-08-29 MED ORDER — KETOROLAC TROMETHAMINE 30 MG/ML IJ SOLN
INTRAMUSCULAR | Status: AC
  Filled 2022-08-29: qty 1

## 2022-08-29 MED ORDER — ZOLPIDEM TARTRATE 5 MG PO TABS: 5.0000 mg | ORAL_TABLET | Freq: Every evening | ORAL | Status: DC | PRN

## 2022-08-29 MED ORDER — 0.9 % SODIUM CHLORIDE (POUR BTL) OPTIME
TOPICAL | Status: DC | PRN
  Administered 2022-08-29: 1000 mL

## 2022-08-29 MED ORDER — FENTANYL CITRATE (PF) 250 MCG/5ML IJ SOLN
INTRAMUSCULAR | Status: DC | PRN
  Administered 2022-08-29 (×5): 50 ug via INTRAVENOUS

## 2022-08-29 MED ORDER — AMISULPRIDE (ANTIEMETIC) 5 MG/2ML IV SOLN: 10.0000 mg | Freq: Once | INTRAVENOUS | Status: DC | PRN

## 2022-08-29 MED ORDER — MEPERIDINE HCL 25 MG/ML IJ SOLN: 6.2500 mg | INTRAMUSCULAR | Status: DC | PRN

## 2022-08-29 MED ORDER — CEFAZOLIN SODIUM-DEXTROSE 2-4 GM/100ML-% IV SOLN
2.0000 g | INTRAVENOUS | Status: AC
  Administered 2022-08-29: 2 g via INTRAVENOUS

## 2022-08-29 MED ORDER — ONDANSETRON HCL 4 MG/2ML IJ SOLN: 4.0000 mg | Freq: Four times a day (QID) | INTRAMUSCULAR | Status: DC | PRN

## 2022-08-29 MED ORDER — CEFAZOLIN SODIUM-DEXTROSE 2-4 GM/100ML-% IV SOLN
INTRAVENOUS | Status: AC
  Filled 2022-08-29: qty 100

## 2022-08-29 MED ORDER — ROCURONIUM BROMIDE 10 MG/ML (PF) SYRINGE
PREFILLED_SYRINGE | INTRAVENOUS | Status: DC | PRN
  Administered 2022-08-29: 70 mg via INTRAVENOUS

## 2022-08-29 MED ORDER — LIDOCAINE 2% (20 MG/ML) 5 ML SYRINGE
INTRAMUSCULAR | Status: DC | PRN
  Administered 2022-08-29: 100 mg via INTRAVENOUS

## 2022-08-29 MED ORDER — HYDROMORPHONE HCL 1 MG/ML IJ SOLN
1.0000 mg | INTRAMUSCULAR | Status: DC | PRN
  Administered 2022-08-29 – 2022-08-30 (×3): 1 mg via INTRAVENOUS
  Filled 2022-08-29 (×3): qty 1

## 2022-08-29 MED ORDER — POVIDONE-IODINE 10 % EX SWAB
2.0000 | Freq: Once | CUTANEOUS | Status: AC
  Administered 2022-08-29: 2 via TOPICAL

## 2022-08-29 MED ORDER — OXYCODONE HCL 5 MG/5ML PO SOLN: 5.0000 mg | Freq: Once | ORAL | Status: DC | PRN

## 2022-08-29 MED ORDER — OXYCODONE-ACETAMINOPHEN 5-325 MG PO TABS
2.0000 | ORAL_TABLET | ORAL | Status: DC | PRN
  Administered 2022-08-29 – 2022-08-30 (×3): 2 via ORAL
  Filled 2022-08-29 (×3): qty 2

## 2022-08-29 MED ORDER — ROCURONIUM BROMIDE 10 MG/ML (PF) SYRINGE
PREFILLED_SYRINGE | INTRAVENOUS | Status: AC
  Filled 2022-08-29: qty 10

## 2022-08-29 MED ORDER — PROMETHAZINE HCL 25 MG/ML IJ SOLN: 6.2500 mg | INTRAMUSCULAR | Status: DC | PRN

## 2022-08-29 MED ORDER — SENNA 8.6 MG PO TABS
1.0000 | ORAL_TABLET | Freq: Two times a day (BID) | ORAL | Status: DC
  Administered 2022-08-29: 8.6 mg via ORAL
  Filled 2022-08-29: qty 1

## 2022-08-29 MED ORDER — SIMETHICONE 80 MG PO CHEW: 80.0000 mg | CHEWABLE_TABLET | Freq: Four times a day (QID) | ORAL | Status: DC | PRN

## 2022-08-29 MED ORDER — GABAPENTIN 300 MG PO CAPS: 300.0000 mg | ORAL_CAPSULE | Freq: Once | ORAL | Status: AC

## 2022-08-29 MED ORDER — KETOROLAC TROMETHAMINE 15 MG/ML IJ SOLN
INTRAMUSCULAR | Status: AC
  Administered 2022-08-29: 15 mg via INTRAVENOUS
  Filled 2022-08-29: qty 1

## 2022-08-29 SURGICAL SUPPLY — 27 items
BAG COUNTER SPONGE SURGICOUNT (BAG) ×2 IMPLANT
BAG SPNG CNTER NS LX DISP (BAG) ×2
BLADE SURG 10 STRL SS (BLADE) ×2 IMPLANT
GAUZE 4X4 16PLY ~~LOC~~+RFID DBL (SPONGE) ×4 IMPLANT
GLOVE BIO SURGEON STRL SZ7.5 (GLOVE) ×2 IMPLANT
GLOVE ECLIPSE 7.0 STRL STRAW (GLOVE) IMPLANT
GLOVE SURG SS PI 7.5 STRL IVOR (GLOVE) IMPLANT
GLOVE SURG UNDER POLY LF SZ7 (GLOVE) ×2 IMPLANT
GOWN STRL REUS W/ TWL LRG LVL3 (GOWN DISPOSABLE) ×4 IMPLANT
GOWN STRL REUS W/ TWL XL LVL3 (GOWN DISPOSABLE) ×4 IMPLANT
GOWN STRL REUS W/TWL 2XL LVL3 (GOWN DISPOSABLE) IMPLANT
GOWN STRL REUS W/TWL LRG LVL3 (GOWN DISPOSABLE) ×2
GOWN STRL REUS W/TWL XL LVL3 (GOWN DISPOSABLE) ×4
HIBICLENS CHG 4% 4OZ BTL (MISCELLANEOUS) ×2 IMPLANT
KIT TURNOVER KIT B (KITS) ×2 IMPLANT
MANIFOLD NEPTUNE II (INSTRUMENTS) ×2 IMPLANT
NS IRRIG 1000ML POUR BTL (IV SOLUTION) ×2 IMPLANT
PACK VAGINAL WOMENS (CUSTOM PROCEDURE TRAY) ×2 IMPLANT
PAD OB MATERNITY 4.3X12.25 (PERSONAL CARE ITEMS) ×2 IMPLANT
SUT VIC AB 2-0 CT1 18 (SUTURE) ×2 IMPLANT
SUT VIC AB 2-0 CT1 27 (SUTURE) ×2
SUT VIC AB 2-0 CT1 TAPERPNT 27 (SUTURE) ×2 IMPLANT
SUT VIC AB PLUS 45CM 1-MO-4 (SUTURE) ×4 IMPLANT
SUT VICRYL 1 TIES 12X18 (SUTURE) ×2 IMPLANT
TOWEL GREEN STERILE FF (TOWEL DISPOSABLE) ×2 IMPLANT
TRAY FOLEY W/BAG SLVR 14FR (SET/KITS/TRAYS/PACK) ×2 IMPLANT
UNDERPAD 30X36 HEAVY ABSORB (UNDERPADS AND DIAPERS) ×2 IMPLANT

## 2022-08-29 NOTE — Anesthesia Postprocedure Evaluation (Signed)
Anesthesia Post Note  Patient: Sarah Washington  Procedure(s) Performed: VAGINAL HYSTERECTOMY (Bilateral: Vagina ) CYST REMOVAL OF LEFT INNER THIGH     Patient location during evaluation: PACU Anesthesia Type: General Level of consciousness: sedated and patient cooperative Pain management: pain level controlled Vital Signs Assessment: post-procedure vital signs reviewed and stable Respiratory status: spontaneous breathing Cardiovascular status: stable Anesthetic complications: no   No notable events documented.  Last Vitals:  Vitals:   08/29/22 1845 08/29/22 2003  BP:  (!) 148/97  Pulse:  78  Resp:  16  Temp:  36.9 C  SpO2: 93% 98%    Last Pain:  Vitals:   08/29/22 2003  TempSrc: Oral  PainSc:                  [name]

## 2022-08-29 NOTE — Interval H&P Note (Signed)
History and Physical Interval Note:  08/29/2022 12:55 PM  Sarah Washington  has presented today for surgery, with the diagnosis of Menorrhagia Fibroids.  The various methods of treatment have been discussed with the patient and family. After consideration of risks, benefits and other options for treatment, the patient has consented to  Procedure(s): HYSTERECTOMY VAGINAL WITH SALPINGECTOMY (Bilateral) as a surgical intervention.  The patient's history has been reviewed, patient examined, no change in status, stable for surgery.  I have reviewed the patient's chart and labs.  Questions were answered to the patient's satisfaction.     Chancy Milroy

## 2022-08-29 NOTE — Anesthesia Procedure Notes (Addendum)
Procedure Name: Intubation Date/Time: 08/29/2022 1:34 PM  Performed by: [name], MDPre-anesthesia Checklist: Patient identified, Emergency Drugs available, Suction available and Patient being monitored Patient Re-evaluated:Patient Re-evaluated prior to induction Oxygen Delivery Method: Circle system utilized Preoxygenation: Pre-oxygenation with 100% oxygen Induction Type: IV induction Ventilation: Mask ventilation without difficulty Laryngoscope Size: Mac and 4 Grade View: Grade I Tube type: Oral Tube size: 7.0 mm Number of attempts: 2 (1 x With miller 2, blade too short to lift epiglottis.) Airway Equipment and Method: Stylet and Oral airway Placement Confirmation: ETT inserted through vocal cords under direct vision, positive ETCO2 and breath sounds checked- equal and bilateral Secured at: 21 cm Tube secured with: Tape Dental Injury: Teeth and Oropharynx as per pre-operative assessment

## 2022-08-29 NOTE — Transfer of Care (Signed)
Immediate Anesthesia Transfer of Care Note  Patient: Sarah Washington  Procedure(s) Performed: VAGINAL HYSTERECTOMY (Bilateral: Vagina ) CYST REMOVAL OF LEFT INNER THIGH  Patient Location: PACU  Anesthesia Type:General  Level of Consciousness: awake and alert   Airway & Oxygen Therapy: Patient Spontanous Breathing and Patient connected to nasal cannula oxygen  Post-op Assessment: Report given to RN and Post -op Vital signs reviewed and stable  Post vital signs: Reviewed and stable  Last Vitals:  Vitals Value Taken Time  BP 145/89 08/29/22 1515  Temp 36.7 C 08/29/22 1515  Pulse 83 08/29/22 1521  Resp 18 08/29/22 1521  SpO2 100 % 08/29/22 1521  Vitals shown include unvalidated device data.  Last Pain:  Vitals:   08/29/22 1001  TempSrc:   PainSc: 0-No pain         Complications: No notable events documented.

## 2022-08-30 ENCOUNTER — Encounter (HOSPITAL_COMMUNITY): Payer: Self-pay | Admitting: Obstetrics and Gynecology

## 2022-08-30 DIAGNOSIS — D259 Leiomyoma of uterus, unspecified: Secondary | ICD-10-CM | POA: Diagnosis not present

## 2022-08-30 LAB — BASIC METABOLIC PANEL
Anion gap: 11 (ref 5–15)
BUN: 7 mg/dL (ref 6–20)
CO2: 27 mmol/L (ref 22–32)
Calcium: 8.7 mg/dL — ABNORMAL LOW (ref 8.9–10.3)
Chloride: 96 mmol/L — ABNORMAL LOW (ref 98–111)
Creatinine, Ser: 0.81 mg/dL (ref 0.44–1.00)
GFR, Estimated: >60 mL/min (ref >60)
Glucose, Bld: 115 mg/dL — ABNORMAL HIGH (ref 70–99)
Potassium: 3.7 mmol/L (ref 3.5–5.1)
Sodium: 134 mmol/L — ABNORMAL LOW (ref 135–145)

## 2022-08-30 LAB — CBC
HCT: 34 % — ABNORMAL LOW (ref 36.0–46.0)
Hemoglobin: 11.4 g/dL — ABNORMAL LOW (ref 12.0–15.0)
MCH: 27.7 pg (ref 26.0–34.0)
MCHC: 33.5 g/dL (ref 30.0–36.0)
MCV: 82.7 fL (ref 80.0–100.0)
Platelets: 330 10*3/uL (ref 150–400)
RBC: 4.11 MIL/uL (ref 3.87–5.11)
RDW: 14.6 % (ref 11.5–15.5)
WBC: 12.2 10*3/uL — ABNORMAL HIGH (ref 4.0–10.5)
nRBC: 0 % (ref 0.0–0.2)

## 2022-08-30 MED ORDER — OXYCODONE-ACETAMINOPHEN 5-325 MG PO TABS: 1.0000 | ORAL_TABLET | ORAL | 0 refills | Status: AC | PRN

## 2022-08-30 MED ORDER — IBUPROFEN 800 MG PO TABS: 800.0000 mg | ORAL_TABLET | Freq: Three times a day (TID) | ORAL | 1 refills | Status: AC

## 2022-08-30 NOTE — Op Note (Signed)
Sarah Washington PROCEDURE DATE: 08/29/2022  PREOPERATIVE DIAGNOSIS:  Symptomatic fibroids, menorrhagia POSTOPERATIVE DIAGNOSIS:  Symptomatic fibroids, menorrhagia SURGEON:   Arlina Robes, M.D. ASSISTANT: C. Leta Jungling, M.D.  An experienced assistant was required given the standard of surgical care given the complexity of the case.  This assistant was needed for exposure, dissection, suctioning, retraction, and for overall help during the procedure.   OPERATION:  Total Vaginal hysterectomy ANESTHESIA:  General endotracheal.  INDICATIONS: The patient is a 37 y.o. [id number] with history of symptomatic uterine fibroids/menorrhagia. The patient made a decision to undergo definite surgical treatment. On the preoperative visit, the risks, benefits, indications, and alternatives of the procedure were reviewed with the patient.  On the day of surgery, the risks of surgery were again discussed with the patient including but not limited to: bleeding which may require transfusion or reoperation; infection which may require antibiotics; injury to bowel, bladder, ureters or other surrounding organs; need for additional procedures; thromboembolic phenomenon, incisional problems and other postoperative/anesthesia complications. Written informed consent was obtained.    OPERATIVE FINDINGS: A 8 week size uterus with normal and bilaterally. Unable to see tubes  ESTIMATED BLOOD LOSS: 100 ml FLUIDS:  As recorded URINE OUTPUT:  As recorded SPECIMENS:  Uterus and cervix sent to pathology COMPLICATIONS:  None immediate.  DESCRIPTION OF PROCEDURE:  The patient received intravenous antibiotics and had sequential compression devices applied to her lower extremities while in the preoperative area.  She was then taken to the operating room where general anesthesia was administered and was found to be adequate.  She was placed in the dorsal lithotomy position, and was prepped and draped in a sterile manner.  A Foley catheter  was inserted into her bladder and attached to constant drainage. After an adequate timeout was performed, attention was turned to her pelvis.  A weighted speculum was then placed in the vagina, and the posterior lip of the cervix was grasped with tenaculum. The posterior cul de sac was entered sharply without difficult. Long bill weight speculum was placed. The anterior lip of the cervix was grasped with tenaculum.   The cervix was then circumferentially incised, and the bladder was dissected off the pubocervical fascia anteriorly without complication.  The anterior cul-de-sac was then entered sharply without difficulty and a retractor was placed.   Zeplin clamps were then used to clamp the uterosacral ligaments on either side.  They were then cut and sutured ligated with 0 Vicryl. Of note, all sutures used in this case were 0 Vicryl unless otherwise noted.   The cardinal ligaments were then clamped, cut and ligated. The uterine vessels and broad ligaments were then serially clamped with the Zeplin clamps, cut, and suture ligated on both sides.  Excellent hemostasis was noted at this point.  The uterus was then delivered via the posterior cul-de-sac, and the cornua were clamped with the Zeplin clamps, transected, and the uterus was delivered and sent to pathology. These pedicles were then suture ligated with a free tie and then in a Bonney fashion to ensure hemostasis.  After completion of the hysterectomy, all pedicles from the uterosacral ligament to the cornua were examined hemostasis was confirmed.  The peritoneal was then closed in a purse string fashion with 2/0 Vicryl. The vaginal cuff was then closed with 2/0 Vicryl.  All instruments were then removed from the pelvis.  The patient tolerated the procedure well.  All instruments, needles, and sponge counts were correct x 2. The patient was taken to the recovery room  in stable condition.    Arlina Robes MD, [place] Attending [place], [facility]

## 2022-08-30 NOTE — Discharge Summary (Signed)
Physician Discharge Summary  Patient ID: Sarah Washington MRN: [id number] DOB/AGE: Oct 01, 1985 37 y.o.  Admit date: 08/29/2022 Discharge date: 08/30/2022  Admission [place] Fibroids  Discharge Diagnoses:  Principal Problem:   Post-operative state   Discharged Condition: good  Hospital Course: Ms [name] was admitted with above Dx. See admit H & P for additional information. She underwent TVH on 3/36/24 without problems. See OP note for additional information. Pt's post op course was unremarkable. She progressed to ambulating, voiding, + flatus, tolerating diet and good oral pain control. Felt amendable for discharge home. Discharge instructions, medications and follow up reviewed with pt. Pt verbalized understanding.   Consults: None  Significant Diagnostic Studies: labs  Treatments: surgery: TVH  Discharge Exam: Blood pressure (!) 149/90, pulse 90, temperature 97.9 F (36.6 C), temperature source Oral, resp. rate 17, height 5\' 7"  (1.702 m), weight 105.2 kg, last menstrual period 08/19/2022, SpO2 94 %. Lungs clear Heart RRR Abd soft + BS GU no bleeding Ext non tender  Disposition: Discharge disposition: 01-Home or Self Care       Discharge Instructions     Call MD for:  difficulty breathing, headache or visual disturbances   Complete by: As directed    Call MD for:  extreme fatigue   Complete by: As directed    Call MD for:  hives   Complete by: As directed    Call MD for:  persistant dizziness or light-headedness   Complete by: As directed    Call MD for:  persistant nausea and vomiting   Complete by: As directed    Call MD for:  redness, tenderness, or signs of infection (pain, swelling, redness, odor or green/yellow discharge around incision site)   Complete by: As directed    Call MD for:  severe uncontrolled pain   Complete by: As directed    Call MD for:  temperature >100.4   Complete by: As directed    Diet - low sodium heart healthy   Complete by:  As directed    Increase activity slowly   Complete by: As directed    Sexual Activity Restrictions   Complete by: As directed    Pelvic rest x 4 weeks      Allergies as of 08/30/2022       Reactions   Sulfa Antibiotics Hives   Bactrim        Medication List     STOP taking these medications    hydrOXYzine 25 MG capsule Commonly known as: VISTARIL   OVER THE COUNTER MEDICATION       TAKE these medications    amLODipine 2.5 MG tablet Commonly known as: NORVASC Take 1 tablet (2.5 mg total) by mouth daily.   hydrochlorothiazide 25 MG tablet Commonly known as: HYDRODIURIL Take 1 tablet (25 mg total) by mouth daily.   ibuprofen 800 MG tablet Commonly known as: ADVIL Take 1 tablet (800 mg total) by mouth every 8 (eight) hours.   multivitamin with minerals Tabs tablet Take 1 tablet by mouth daily.   oxyCODONE-acetaminophen 5-325 MG tablet Commonly known as: PERCOCET/ROXICET Take 1 tablet by mouth every 4 (four) hours as needed for moderate pain.   pantoprazole 40 MG tablet Commonly known as: PROTONIX TAKE 1 TABLET(40 MG) BY MOUTH DAILY AS NEEDED FOR GERD   sertraline 25 MG tablet Commonly known as: ZOLOFT Take 1 tablet (25 mg total) by mouth daily. What changed: Another medication with the same name was removed. Continue taking this medication, and follow the  directions you see here.        Follow-up [place] for [facility]. Schedule an appointment as soon as possible for a visit in 4 week(s).   Specialty: Obstetrics and Gynecology Why: Face to face post op appt with Dr. Gardiner Fanti Contact information: [place] [phone]                Signed: Chancy Milroy 08/30/2022, 7:54 AM

## 2022-08-31 ENCOUNTER — Telehealth: Payer: Self-pay

## 2022-08-31 LAB — SURGICAL PATHOLOGY

## 2022-08-31 NOTE — Telephone Encounter (Signed)
Call returned to patient and documented in another encounter from today

## 2022-08-31 NOTE — Telephone Encounter (Signed)
Opened in error

## 2022-08-31 NOTE — Telephone Encounter (Signed)
Pt is calling back to speak with Opal Sidles. Please call pt back at    [phone].

## 2022-08-31 NOTE — Transitions of Care (Post Inpatient/ED Visit) (Signed)
   08/31/2022  Name: Sarah Washington MRN: [id number] DOB: 1986/05/24  Today's TOC FU Call Status: Today's TOC FU Call Status:: Successful TOC FU Call Competed TOC FU Call Complete Date: 08/31/22  This call was completed by Evelena Asa RN   Transition Care Management Follow-up Telephone Call Date of Discharge: 08/30/22 Discharge Facility: [facility]) Type of Discharge: Inpatient Admission Primary Inpatient Discharge Diagnosis:: fibroids How have you been since you were released from the hospital?: Better Any questions or concerns?: Yes (concerned about constipation.  Instructed her to take stool softener and increase water intake.  She also noted that apple juice helps with bowel movements. instructed her to call provider if no bowel movement in 3 days.) Patient Questions/Concerns Addressed: Other: (educated patient over the phone)  Items Reviewed: Did you receive and understand the discharge instructions provided?: Yes Medications obtained and verified?: Yes (Medications Reviewed) Any new allergies since your discharge?: No Dietary orders reviewed?: Yes Type of Diet Ordered:: low sodium heart healthy Do you have support at home?: Yes People in Home: child(ren), adult, spouse  Home Care and Equipment/Supplies: [place] Ordered?: No Any new equipment or medical supplies ordered?: No  Functional Questionnaire: Do you need assistance with bathing/showering or dressing?: No Do you need assistance with meal preparation?: No Do you need assistance with eating?: No Do you have difficulty maintaining continence: No Do you need assistance with getting out of bed/getting out of a chair/moving?: No Do you have difficulty managing or taking your medications?: No  Follow up appointments reviewed: PCP Follow-up appointment confirmed?: Yes Date of PCP follow-up appointment?: 09/18/22 Follow-up Provider: Dr Redmond Pulling [facility] Follow-up appointment  confirmed?: No Reason Specialist Follow-Up Not Confirmed: Patient has Specialist Provider Number and will Call for Appointment Do you need transportation to your follow-up appointment?: No Do you understand care options if your condition(s) worsen?: Yes-patient verbalized understanding    SIGNATURE  Eden Lathe, RN for Evelena Asa, RN

## 2022-08-31 NOTE — Transitions of Care (Post Inpatient/ED Visit) (Signed)
   08/31/2022  Name: Sarah Washington MRN: [id number] DOB: Oct 26, 1985  Today's TOC FU Call Status: Today's TOC FU Call Status:: Unsuccessul Call (1st Attempt) Unsuccessful Call (1st Attempt) Date: 08/31/22  Attempted to reach the patient regarding the most recent Inpatient/ED visit.  Follow Up Plan: Additional outreach attempts will be made to reach the patient to complete the Transitions of Care (Post Inpatient/ED visit) call.   Signature Eden Lathe, RN

## 2022-09-18 ENCOUNTER — Ambulatory Visit (INDEPENDENT_AMBULATORY_CARE_PROVIDER_SITE_OTHER): Payer: Medicaid Other | Admitting: Family Medicine

## 2022-09-18 ENCOUNTER — Encounter: Payer: Self-pay | Admitting: Family Medicine

## 2022-09-18 VITALS — BP 129/88 | HR 96 | Temp 98.1°F | Resp 16 | Wt 231.2 lb

## 2022-09-18 DIAGNOSIS — Z90710 Acquired absence of both cervix and uterus: Secondary | ICD-10-CM | POA: Diagnosis not present

## 2022-09-18 DIAGNOSIS — I10 Essential (primary) hypertension: Secondary | ICD-10-CM

## 2022-09-18 DIAGNOSIS — Z09 Encounter for follow-up examination after completed treatment for conditions other than malignant neoplasm: Secondary | ICD-10-CM

## 2022-09-18 NOTE — Progress Notes (Signed)
Established Patient Office Visit  Subjective    Patient ID: Sarah Washington, female    DOB: [birth year]  Age: 37 y.o. MRN: [id number]  CC:  Chief Complaint  Patient presents with   Follow-up    HFU    HPI Sarah Washington presents for follow up after recent hysterectomy. Patient denies acute complaints or concerns.    Outpatient Encounter Medications as of 09/18/2022  Medication Sig   amLODipine (NORVASC) 2.5 MG tablet Take 1 tablet (2.5 mg total) by mouth daily.   hydrochlorothiazide (HYDRODIURIL) 25 MG tablet Take 1 tablet (25 mg total) by mouth daily.   ibuprofen (ADVIL) 800 MG tablet Take 1 tablet (800 mg total) by mouth every 8 (eight) hours.   Multiple Vitamin (MULTIVITAMIN WITH MINERALS) TABS tablet Take 1 tablet by mouth daily.   oxyCODONE-acetaminophen (PERCOCET/ROXICET) 5-325 MG tablet Take 1 tablet by mouth every 4 (four) hours as needed for moderate pain.   pantoprazole (PROTONIX) 40 MG tablet TAKE 1 TABLET(40 MG) BY MOUTH DAILY AS NEEDED FOR GERD   sertraline (ZOLOFT) 25 MG tablet Take 1 tablet (25 mg total) by mouth daily. (Patient not taking: Reported on 08/29/2022)   No facility-administered encounter medications on file as of 09/18/2022.    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    Past Surgical History:  Procedure Laterality Date   boils removed Bilateral    x 2 - groin area - one on rright and one on left with anesthesia   CYST EXCISION  08/29/2022   Procedure: CYST REMOVAL OF LEFT INNER THIGH;  Surgeon: Hermina Staggers, MD;  Location: [facility] OR;  Service: Gynecology;;   TUBAL LIGATION     VAGINAL HYSTERECTOMY Bilateral 08/29/2022    Procedure: VAGINAL HYSTERECTOMY;  Surgeon: Hermina Staggers, MD;  Location: [facility] OR;  Service: Gynecology;  Laterality: Bilateral;    Family History  Problem Relation Age of Onset   Hypertension Mother    Diabetes Mother    Diabetes Father    Hypertension Father    Mental illness Father    Arthritis Maternal Grandmother    Cancer Maternal Grandmother    Diabetes Maternal Grandmother    Hypertension Maternal Grandmother    Stroke Maternal Grandmother    Breast cancer Maternal Grandmother    Stroke Maternal Grandfather    Arthritis Paternal Grandmother    Cancer Paternal Grandmother    Diabetes Paternal Grandmother    Hypertension Paternal Grandmother    Breast cancer Paternal Grandmother     Social History   Socioeconomic History   Marital status: Single    Spouse name: Not on file   Number of children: 3   Years of education: Not on file   Highest education level: Not on file  Occupational History   Not on file  Tobacco Use   Smoking status: Never    Passive exposure: Current   Smokeless tobacco: Never  Vaping Use   Vaping Use: Never used  Substance and Sexual Activity   Alcohol use: Yes    Comment: occasional wine   Drug use: Yes  Types: Marijuana    Comment: last use 08/22/22   Sexual activity: Yes    Birth control/protection: Surgical    Comment: tubal ligation  Other Topics Concern   Not on file  Social History Narrative   Not on file   Social Determinants of Health   Financial Resource Strain: Not on file  Food Insecurity: Not on file  Transportation Needs: Not on file  Physical Activity: Not on file  Stress: Not on file  Social Connections: Not on file  Intimate Partner Violence: Not on file    Review of Systems  All other systems reviewed and are negative.       Objective    BP 129/88   Pulse 96   Temp 98.1 F (36.7 C) (Oral)   Resp 16   Wt 231 lb 3.2 oz (104.9 kg)   LMP 08/19/2022 (Exact Date)   SpO2 96%   BMI 36.21 kg/m    Physical Exam Vitals and nursing note reviewed.  Constitutional:      General: She is not in acute distress. Cardiovascular:     Rate and Rhythm: Normal rate and regular rhythm.  Pulmonary:     Effort: Pulmonary effort is normal.     Breath sounds: Normal breath sounds.  Abdominal:     Palpations: Abdomen is soft.     Tenderness: There is no abdominal tenderness.  Neurological:     General: No focal deficit present.     Mental Status: She is alert and oriented to person, place, and time.         Assessment & Plan:   1. History of hysterectomy for benign disease Doing well post-op. Management per consultant.   2. Primary hypertension Appears stable continue  3. Hospital discharge follow-up  Return in about 6 months (around 03/20/2023) for follow up.   Tommie Raymond, MD

## 2022-09-25 ENCOUNTER — Encounter: Payer: Self-pay | Admitting: Family Medicine

## 2022-11-30 ENCOUNTER — Telehealth: Payer: Self-pay | Admitting: Hematology

## 2023-01-03 ENCOUNTER — Ambulatory Visit: Payer: Medicaid Other | Admitting: Hematology

## 2023-01-03 ENCOUNTER — Other Ambulatory Visit: Payer: Medicaid Other

## 2023-01-26 ENCOUNTER — Other Ambulatory Visit: Payer: Self-pay

## 2023-01-26 DIAGNOSIS — D509 Iron deficiency anemia, unspecified: Secondary | ICD-10-CM

## 2023-01-29 ENCOUNTER — Inpatient Hospital Stay (HOSPITAL_BASED_OUTPATIENT_CLINIC_OR_DEPARTMENT_OTHER): Payer: Medicaid Other | Admitting: Hematology

## 2023-01-29 ENCOUNTER — Inpatient Hospital Stay: Payer: Medicaid Other | Attending: Hematology

## 2023-01-29 DIAGNOSIS — Z79899 Other long term (current) drug therapy: Secondary | ICD-10-CM | POA: Insufficient documentation

## 2023-01-29 DIAGNOSIS — M311 Thrombotic microangiopathy, unspecified: Secondary | ICD-10-CM | POA: Diagnosis present

## 2023-01-29 DIAGNOSIS — M3119 Other thrombotic microangiopathy: Secondary | ICD-10-CM

## 2023-01-29 DIAGNOSIS — D509 Iron deficiency anemia, unspecified: Secondary | ICD-10-CM | POA: Diagnosis not present

## 2023-01-29 LAB — CBC WITH DIFFERENTIAL (CANCER CENTER ONLY)
Abs Immature Granulocytes: 0.03 10*3/uL (ref 0.00–0.07)
Basophils Absolute: 0.1 10*3/uL (ref 0.0–0.1)
Basophils Relative: 1 %
Eosinophils Absolute: 0.1 10*3/uL (ref 0.0–0.5)
Eosinophils Relative: 1 %
HCT: 36.9 % (ref 36.0–46.0)
Hemoglobin: 12.4 g/dL (ref 12.0–15.0)
Immature Granulocytes: 0 %
Lymphocytes Relative: 37 %
Lymphs Abs: 3.1 10*3/uL (ref 0.7–4.0)
MCH: 28.8 pg (ref 26.0–34.0)
MCHC: 33.6 g/dL (ref 30.0–36.0)
MCV: 85.8 fL (ref 80.0–100.0)
Monocytes Absolute: 0.4 10*3/uL (ref 0.1–1.0)
Monocytes Relative: 5 %
Neutro Abs: 4.7 10*3/uL (ref 1.7–7.7)
Neutrophils Relative %: 56 %
Platelet Count: 164 10*3/uL (ref 150–400)
RBC: 4.3 MIL/uL (ref 3.87–5.11)
RDW: 13.8 % (ref 11.5–15.5)
WBC Count: 8.4 10*3/uL (ref 4.0–10.5)
nRBC: 0 % (ref 0.0–0.2)

## 2023-01-29 LAB — CMP (CANCER CENTER ONLY)
ALT: 11 U/L (ref 0–44)
AST: 10 U/L — ABNORMAL LOW (ref 15–41)
Albumin: 3.9 g/dL (ref 3.5–5.0)
Alkaline Phosphatase: 56 U/L (ref 38–126)
Anion gap: 5 (ref 5–15)
BUN: 12 mg/dL (ref 6–20)
CO2: 26 mmol/L (ref 22–32)
Calcium: 9 mg/dL (ref 8.9–10.3)
Chloride: 107 mmol/L (ref 98–111)
Creatinine: 0.83 mg/dL (ref 0.44–1.00)
GFR, Estimated: >60 mL/min (ref >60)
Glucose, Bld: 102 mg/dL — ABNORMAL HIGH (ref 70–99)
Potassium: 3.9 mmol/L (ref 3.5–5.1)
Sodium: 138 mmol/L (ref 135–145)
Total Bilirubin: 0.2 mg/dL — ABNORMAL LOW (ref 0.3–1.2)
Total Protein: 7.9 g/dL (ref 6.5–8.1)

## 2023-01-29 LAB — IRON AND IRON BINDING CAPACITY (CC-WL,HP ONLY)
Iron: 34 ug/dL (ref 28–170)
Saturation Ratios: 11 % (ref 10.4–31.8)
TIBC: 321 ug/dL (ref 250–450)
UIBC: 287 ug/dL (ref 148–442)

## 2023-01-29 LAB — FERRITIN: Ferritin: 65 ng/mL (ref 11–307)

## 2023-01-29 NOTE — Progress Notes (Signed)
HEMATOLOGY/ONCOLOGY CLINIC VISIT NOTE  Date of Service: 01/29/23    Patient Care Team: Georganna Skeans, MD as PCP - General (Family Medicine) Caryl Pina, PA-C as Consulting Physician (Dermatology)  CHIEF COMPLAINTS/PURPOSE OF CONSULTATION:  Follow-up for continued evaluation and management of TTP  HISTORY OF PRESENTING ILLNESS:    INTERVAL HISTORY:    Sarah [place] for follow-up of her history of TTP.  She notes no acute new symptoms since her last clinic visit.   Patient was last seen by me on 07/04/2022 and she complained of mild fatigue due to iron deficient.   Patient notes she has been doing well overall without any new or severe medical concerns since our last visit. She denies fever, chills, night sweats, new infection issues, unexpected weight loss, back pain, chest pain, abdominal pain, or leg swelling. She denies depression, suicidal or homicidal ideation.   She notes she recently had hysterectomy without any complication around 2-3 months ago.   She does complain of acid reflux and needs refill on Protonix.   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    SURGICAL HISTORY: Past Surgical History:  Procedure Laterality Date   boils removed Bilateral    x 2 - groin area - one on rright and one on left with anesthesia   CYST EXCISION  08/29/2022   Procedure: CYST REMOVAL OF LEFT INNER THIGH;  Surgeon: Hermina Staggers, MD;  Location: [facility];  Service: Gynecology;;   TUBAL LIGATION     VAGINAL HYSTERECTOMY Bilateral 08/29/2022   Procedure: VAGINAL HYSTERECTOMY;  Surgeon: Hermina Staggers, MD;  Location: [facility] OR;  Service: Gynecology;  Laterality: Bilateral;  tubal ligation in 2010  SOCIAL HISTORY: Social History   Socioeconomic History   Marital status: Single    Spouse name: Not on file   Number of children: 3   Years of education: Not on file   Highest education level:  Not on file  Occupational History   Not on file  Tobacco Use   Smoking status: Never    Passive exposure: Current   Smokeless tobacco: Never  Vaping Use   Vaping status: Never Used  Substance and Sexual Activity   Alcohol use: Yes    Comment: occasional wine   Drug use: Yes    Types: Marijuana    Comment: last use 08/22/22   Sexual activity: Yes    Birth control/protection: Surgical    Comment: tubal ligation  Other Topics Concern   Not on file  Social History Narrative   Not on file   Social Determinants of Health   Financial Resource Strain: Not on file  Food Insecurity: Not on file  Transportation Needs: Not on file  Physical Activity: Not on file  Stress: Not on file  Social Connections: Unknown (10/18/2021)   [facility]   Social Network    Social Network: Not on file  Intimate Partner Violence: Unknown (09/09/2021)   Received from [facility]   HITS    Physically Hurt: Not on file    Insult or Talk Down To: Not on file    Threaten Physical Harm: Not on file    Scream or Curse: Not on file    FAMILY HISTORY: Family History  Problem Relation Age of Onset   Hypertension Mother    Diabetes Mother    Diabetes Father    Hypertension Father    Mental illness Father  Arthritis Maternal Grandmother    Cancer Maternal Grandmother    Diabetes Maternal Grandmother    Hypertension Maternal Grandmother    Stroke Maternal Grandmother    Breast cancer Maternal Grandmother    Stroke Maternal Grandfather    Arthritis Paternal Grandmother    Cancer Paternal Grandmother    Diabetes Paternal Grandmother    Hypertension Paternal Grandmother    Breast cancer Paternal Grandmother     ALLERGIES:  is allergic to sulfa antibiotics.  MEDICATIONS:  Current Outpatient Medications  Medication Sig Dispense Refill   amLODipine (NORVASC) 2.5 MG tablet Take 1 tablet (2.5 mg total) by mouth daily. 90 tablet 3   hydrochlorothiazide (HYDRODIURIL) 25 MG tablet  Take 1 tablet (25 mg total) by mouth daily. 90 tablet 3   ibuprofen (ADVIL) 800 MG tablet Take 1 tablet (800 mg total) by mouth every 8 (eight) hours. 30 tablet 1   Multiple Vitamin (MULTIVITAMIN WITH MINERALS) TABS tablet Take 1 tablet by mouth daily.     oxyCODONE-acetaminophen (PERCOCET/ROXICET) 5-325 MG tablet Take 1 tablet by mouth every 4 (four) hours as needed for moderate pain. 30 tablet 0   pantoprazole (PROTONIX) 40 MG tablet TAKE 1 TABLET(40 MG) BY MOUTH DAILY AS NEEDED FOR GERD 30 tablet 1   sertraline (ZOLOFT) 25 MG tablet Take 1 tablet (25 mg total) by mouth daily. (Patient not taking: Reported on 08/29/2022) 30 tablet 2   No current facility-administered medications for this visit.    REVIEW OF SYSTEMS:   10 Point review of Systems was done is negative except as noted above.  PHYSICAL EXAMINATION: ECOG FS:1 - Symptomatic but completely ambulatory  There were no vitals filed for this visit.    Wt Readings from Last 3 Encounters:  09/18/22 231 lb 3.2 oz (104.9 kg)  08/29/22 232 lb (105.2 kg)  08/22/22 234 lb 3.2 oz (106.2 kg)   There is no height or weight on file to calculate BMI.    NAD GENERAL:alert, in no acute distress and comfortable SKIN: no acute rashes, no significant lesions EYES: conjunctiva are pink and non-injected, sclera anicteric OROPHARYNX: MMM, no exudates, no oropharyngeal erythema or ulceration NECK: supple, no JVD LYMPH:  no palpable lymphadenopathy in the cervical, axillary or inguinal regions LUNGS: clear to auscultation b/l with normal respiratory effort HEART: regular rate & rhythm ABDOMEN:  normoactive bowel sounds , non tender, not distended. Extremity: no pedal edema PSYCH: alert & oriented x 3 with fluent speech NEURO: no focal motor/sensory deficits   LABORATORY DATA:  I have reviewed the data as listed .    Latest Ref Rng & Units 01/29/2023    9:38 AM 08/30/2022    5:36 AM 08/29/2022    9:37 AM  CBC  WBC 4.0 - 10.5 K/uL 8.4   12.2  8.6   Hemoglobin 12.0 - 15.0 g/dL 16.1  09.6  04.5   Hematocrit 36.0 - 46.0 % 36.9  34.0  37.9   Platelets 150 - 400 K/uL 164  330  342       Latest Ref Rng & Units 01/29/2023    9:38 AM 08/30/2022    5:36 AM 08/29/2022    9:37 AM  CMP  Glucose 70 - 99 mg/dL 409  811  90   BUN 6 - 20 mg/dL 12  7  10    Creatinine 0.44 - 1.00 mg/dL 9.14  7.82  9.56   Sodium 135 - 145 mmol/L 138  134  135   Potassium 3.5 - 5.1  mmol/L 3.9  3.7  3.3   Chloride 98 - 111 mmol/L 107  96  104   CO2 22 - 32 mmol/L 26  27  22    Calcium 8.9 - 10.3 mg/dL 9.0  8.7  8.8   Total Protein 6.5 - 8.1 g/dL 7.9     Total Bilirubin 0.3 - 1.2 mg/dL 0.2     Alkaline Phos 38 - 126 U/L 56     AST 15 - 41 U/L 10     ALT 0 - 44 U/L 11      . Lab Results  Component Value Date   IRON 34 01/29/2023   TIBC 321 01/29/2023   IRONPCTSAT 11 01/29/2023   (Iron and TIBC)  Lab Results  Component Value Date   FERRITIN 65 01/29/2023      RADIOGRAPHIC STUDIES: I have personally reviewed the radiological images as listed and agreed with the findings in the report. No results found.  ASSESSMENT & PLAN:   37 y.o. female with hydradenitis suppurativa and   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  Severely low ADAMTS 13 levels of 6 on diagnosis. Now normalized to 69 ( ADAMTS13 from 11/26/17 ) S/p Plasmapheresis x 5. Completed Rituxan weekly x 4 doses. On labs today patient has no evidence of TTP relapse at this time.  Normal platelets of 356. Adam TS 13 activity level of 53%  2.  Anemia-microcytic.  Likely due to iron deficiency.   PLAN: -Discussed lab results from today, 01/29/2023, with the patient. CBC and CMP are stable.  -Will refill patient's Protonix for acid reflux.  -Patient has no lab or clinical evidence of TTP relapse at this time. -Continue iron polysaccharide 150 mg p.o. daily to continue to address iron deficiency from heavy periods -Patient notes her depression has improved  and she has no suicidal or homicidal ideation.   FOLLOW-UP: RTC with Dr Candise Che with labs in 6 months  The total time spent in the appointment was 20 minutes* .  All of the patient's questions were answered with apparent satisfaction. The patient knows to call the clinic with any problems, questions or concerns.   Wyvonnia Lora MD MS AAHIVMS [facility] Hematology/Oncology Physician [facility]  .*Total Encounter Time as defined by the Centers for Medicare and Medicaid Services includes, in addition to the face-to-face time of a patient visit (documented in the note above) non-face-to-face time: obtaining and reviewing outside history, ordering and reviewing medications, tests or procedures, care coordination (communications with other health care professionals or caregivers) and documentation in the medical record.   I,Param Shah,acting as a Neurosurgeon for Wyvonnia Lora, MD.,have documented all relevant documentation on the behalf of Wyvonnia Lora, MD,as directed by  Wyvonnia Lora, MD while in the presence of Wyvonnia Lora, MD.   .I have reviewed the above documentation for accuracy and completeness, and I agree with the above. Johney Maine MD

## 2023-02-05 ENCOUNTER — Encounter: Payer: Self-pay | Admitting: Hematology

## 2023-02-05 MED ORDER — PANTOPRAZOLE SODIUM 40 MG PO TBEC: 40.0000 mg | DELAYED_RELEASE_TABLET | Freq: Every day | ORAL | 1 refills | Status: DC

## 2023-02-06 ENCOUNTER — Other Ambulatory Visit: Payer: Self-pay | Admitting: Hematology

## 2023-02-06 DIAGNOSIS — M3119 Other thrombotic microangiopathy: Secondary | ICD-10-CM

## 2023-02-07 ENCOUNTER — Telehealth: Payer: Self-pay | Admitting: Hematology

## 2023-02-07 NOTE — Telephone Encounter (Signed)
Patiernt is aware of scheduled appointment times/dates

## 2023-02-12 ENCOUNTER — Other Ambulatory Visit: Payer: Self-pay | Admitting: Family Medicine

## 2023-02-12 DIAGNOSIS — I10 Essential (primary) hypertension: Secondary | ICD-10-CM

## 2023-02-12 NOTE — Telephone Encounter (Signed)
Medication Refill - Medication: amLODipine (NORVASC) 2.5 MG tablet , hydrochlorothiazide (HYDRODIURIL) 25 MG tablet   Has the patient contacted their pharmacy? No.   Preferred Pharmacy (with phone number or street name):  [facility] [phone] - [place], [place] - [address] Phone: [phone]  Fax: [phone]      Has the patient been seen for an appointment in the last year OR does the patient have an upcoming appointment? Yes.    Please assist patient further

## 2023-02-13 NOTE — Telephone Encounter (Signed)
Requested medication (s) are due for refill today: yes  Requested medication (s) are on the active medication list: yes  Last refill:    Future visit scheduled: yes  Notes to clinic:  Unable to refill per protocol, last refill by another provider.      Requested Prescriptions  Pending Prescriptions Disp Refills   amLODipine (NORVASC) 2.5 MG tablet 90 tablet 3    Sig: Take 1 tablet (2.5 mg total) by mouth daily.     Cardiovascular: Calcium Channel Blockers 2 Passed - 02/12/2023  1:36 PM      Passed - Last BP in normal range    BP Readings from Last 1 Encounters:  01/29/23 132/88         Passed - Last Heart Rate in normal range    Pulse Readings from Last 1 Encounters:  01/29/23 94         Passed - Valid encounter within last 6 months    Recent Outpatient Visits           4 months ago History of hysterectomy for benign disease   [place] [facility], MD   9 months ago Anxiety and depression   [place] [facility], MD   10 months ago Anxiety and depression   [place] [facility], [place], NP   2 years ago Hospital discharge follow-up   [facility], [place], NP   2 years ago Encounter for annual physical exam   [place] [facility], Kandee Keen, MD       Future Appointments             In 2 weeks Georganna Skeans, MD [facility] at [facility]             hydrochlorothiazide (HYDRODIURIL) 25 MG tablet 90 tablet 3    Sig: Take 1 tablet (25 mg total) by mouth daily.     Cardiovascular: Diuretics - Thiazide Passed - 02/12/2023  1:36 PM      Passed - Cr in normal range and within 180 days    Creatinine  Date Value Ref Range Status  01/29/2023 0.83 0.44 - 1.00 mg/dL Final         Passed - K in normal range and within 180 days    Potassium  Date Value Ref Range Status   01/29/2023 3.9 3.5 - 5.1 mmol/L Final         Passed - Na in normal range and within 180 days    Sodium  Date Value Ref Range Status  01/29/2023 138 135 - 145 mmol/L Final  09/20/2020 137 134 - 144 mmol/L Final         Passed - Last BP in normal range    BP Readings from Last 1 Encounters:  01/29/23 132/88         Passed - Valid encounter within last 6 months    Recent Outpatient Visits           4 months ago History of hysterectomy for benign disease   [place] [facility], MD   9 months ago Anxiety and depression   [place] [facility], MD   10 months ago Anxiety and depression   [facility], Salomon Fick, NP  2 years ago Hospital discharge follow-up   [facility], [place], NP   2 years ago Encounter for annual physical exam   [facility] at [facility], Kandee Keen, MD       Future Appointments             In 2 weeks Georganna Skeans, MD [facility] at [facility]

## 2023-02-27 ENCOUNTER — Ambulatory Visit (INDEPENDENT_AMBULATORY_CARE_PROVIDER_SITE_OTHER): Payer: Medicaid Other | Admitting: Family Medicine

## 2023-02-27 ENCOUNTER — Encounter: Payer: Self-pay | Admitting: Family Medicine

## 2023-02-27 VITALS — BP 120/85 | HR 100 | Temp 98.0°F | Resp 16 | Ht 67.0 in | Wt 228.8 lb

## 2023-02-27 DIAGNOSIS — R0683 Snoring: Secondary | ICD-10-CM | POA: Diagnosis not present

## 2023-02-27 DIAGNOSIS — Z6835 Body mass index (BMI) 35.0-35.9, adult: Secondary | ICD-10-CM | POA: Diagnosis not present

## 2023-02-27 DIAGNOSIS — G4700 Insomnia, unspecified: Secondary | ICD-10-CM

## 2023-02-27 DIAGNOSIS — E6609 Other obesity due to excess calories: Secondary | ICD-10-CM

## 2023-02-28 ENCOUNTER — Encounter: Payer: Self-pay | Admitting: Family Medicine

## 2023-02-28 NOTE — Progress Notes (Signed)
Established Patient Office Visit  Subjective    Patient ID: Sarah Washington, female    DOB: [birth year]  Age: 37 y.o. MRN: [id number]  CC:  Chief Complaint  Patient presents with   Cerumen Impaction   Insomnia    HPI Sarah Washington presents with complaint of difficult sleeping . She also reports snoring at night and daytime somnolence.   Outpatient Encounter Medications as of 02/27/2023  Medication Sig   amLODipine (NORVASC) 2.5 MG tablet Take 1 tablet (2.5 mg total) by mouth daily.   hydrochlorothiazide (HYDRODIURIL) 25 MG tablet Take 1 tablet (25 mg total) by mouth daily.   pantoprazole (PROTONIX) 40 MG tablet TAKE 1 TABLET(40 MG) BY MOUTH DAILY BEFORE BREAKFAST   ibuprofen (ADVIL) 800 MG tablet Take 1 tablet (800 mg total) by mouth every 8 (eight) hours. (Patient not taking: Reported on 02/27/2023)   Multiple Vitamin (MULTIVITAMIN WITH MINERALS) TABS tablet Take 1 tablet by mouth daily. (Patient not taking: Reported on 02/27/2023)   oxyCODONE-acetaminophen (PERCOCET/ROXICET) 5-325 MG tablet Take 1 tablet by mouth every 4 (four) hours as needed for moderate pain. (Patient not taking: Reported on 02/27/2023)   sertraline (ZOLOFT) 25 MG tablet Take 1 tablet (25 mg total) by mouth daily. (Patient not taking: Reported on 08/29/2022)   No facility-administered encounter medications on file as of 02/27/2023.    Past Medical History:  Diagnosis Date   Anxiety and depression    Fibroids 06/2022   uterine fibroids   History of blood transfusion    Iron infusions - last on on 07/31/22   Hypertension    Hypothyroidism    hx in past - yrs ago- no current problems, no meds   Normocytic anemia    Substance abuse (HCC)    marijuana   TTP (thrombotic thrombocytopenic purpura) (HCC) 06/2022    Past Surgical History:  Procedure Laterality Date   boils removed Bilateral    x 2 - groin area - one on rright and one on left with anesthesia   CYST EXCISION  08/29/2022   Procedure: CYST  REMOVAL OF LEFT INNER THIGH;  Surgeon: Hermina Staggers, MD;  Location: [facility];  Service: Gynecology;;   TUBAL LIGATION     VAGINAL HYSTERECTOMY Bilateral 08/29/2022   Procedure: VAGINAL HYSTERECTOMY;  Surgeon: Hermina Staggers, MD;  Location: [facility];  Service: Gynecology;  Laterality: Bilateral;    Family History  Problem Relation Age of Onset   Hypertension Mother    Diabetes Mother    Diabetes Father    Hypertension Father    Mental illness Father    Arthritis Maternal Grandmother    Cancer Maternal Grandmother    Diabetes Maternal Grandmother    Hypertension Maternal Grandmother    Stroke Maternal Grandmother    Breast cancer Maternal Grandmother    Stroke Maternal Grandfather    Arthritis Paternal Grandmother    Cancer Paternal Grandmother    Diabetes Paternal Grandmother    Hypertension Paternal Grandmother    Breast cancer Paternal Grandmother     Social History   Socioeconomic History   Marital status: Single    Spouse name: Not on file   Number of children: 3   Years of education: Not on file   Highest education level: Not on file  Occupational History   Not on file  Tobacco Use   Smoking status: Never    Passive exposure: Current   Smokeless tobacco: Never  Vaping Use   Vaping status: Never Used  Substance and Sexual Activity   Alcohol use: Yes    Comment: occasional wine   Drug use: Yes    Types: Marijuana    Comment: last use 08/22/22   Sexual activity: Yes    Birth control/protection: Surgical    Comment: tubal ligation  Other Topics Concern   Not on file  Social History Narrative   Not on file   Social Determinants of Health   Financial Resource Strain: Low Risk  (02/27/2023)   Overall Financial Resource Strain (CARDIA)    Difficulty of Paying Living Expenses: Not very hard  Food Insecurity: No Food Insecurity (02/27/2023)   Hunger Vital Sign    Worried About Running Out of Food in the Last Year: Never true    Ran Out of Food in the Last Year:  Never true  Transportation Needs: No Transportation Needs (02/27/2023)   PRAPARE - Administrator, Civil Service (Medical): No    Lack of Transportation (Non-Medical): No  Physical Activity: Sufficiently Active (02/27/2023)   Exercise Vital Sign    Days of Exercise per Week: 6 days    Minutes of Exercise per Session: 30 min  Stress: No Stress Concern Present (02/27/2023)   [employer] of Occupational Health - Occupational Stress Questionnaire    Feeling of Stress : Not at all  Social Connections: Socially Integrated (02/27/2023)   Social Connection and Isolation Panel [NHANES]    Frequency of Communication with Friends and Family: More than three times a week    Frequency of Social Gatherings with Friends and Family: More than three times a week    Attends Religious Services: More than 4 times per year    Active Member of [employer] or Organizations: Not on file    Attends Banker Meetings: More than 4 times per year    Marital Status: Married  Catering manager Violence: Not At Risk (02/27/2023)   Humiliation, Afraid, Rape, and Kick questionnaire    Fear of Current or Ex-Partner: No    Emotionally Abused: No    Physically Abused: No    Sexually Abused: No    Review of Systems  All other systems reviewed and are negative.       Objective    BP 120/85   Pulse 100   Temp 98 F (36.7 C) (Oral)   Resp 16   Ht 5\' 7"  (1.702 m)   Wt 228 lb 12.8 oz (103.8 kg)   SpO2 96%   BMI 35.84 kg/m   Physical Exam Vitals and nursing note reviewed.  Constitutional:      General: She is not in acute distress. Cardiovascular:     Rate and Rhythm: Normal rate and regular rhythm.  Pulmonary:     Effort: Pulmonary effort is normal.     Breath sounds: Normal breath sounds.  Abdominal:     Palpations: Abdomen is soft.     Tenderness: There is no abdominal tenderness.  Neurological:     General: No focal deficit present.     Mental Status: She is alert and oriented  to person, place, and time.         Assessment & Plan:   1. Insomnia, unspecified type Trazodone prn. Discussed sleep hygiene  2. Snores Referral for sleep study - [facility] Sleep Study; Future  3. Class 2 obesity due to excess calories without serious comorbidity with body mass index (BMI) of 35.0 to 35.9 in adult    No follow-ups on file.   Andrey Campanile,  Demetrios Isaacs, MD

## 2023-03-20 MED ORDER — HYDROCHLOROTHIAZIDE 25 MG PO TABS: 25.0000 mg | ORAL_TABLET | Freq: Every day | ORAL | 0 refills | Status: AC

## 2023-03-20 MED ORDER — AMLODIPINE BESYLATE 2.5 MG PO TABS: 2.5000 mg | ORAL_TABLET | Freq: Every day | ORAL | 0 refills | Status: AC

## 2023-07-09 ENCOUNTER — Other Ambulatory Visit: Payer: Self-pay

## 2023-07-09 DIAGNOSIS — M3119 Other thrombotic microangiopathy: Secondary | ICD-10-CM

## 2023-07-09 DIAGNOSIS — D509 Iron deficiency anemia, unspecified: Secondary | ICD-10-CM

## 2023-07-10 ENCOUNTER — Inpatient Hospital Stay: Payer: Medicaid Other | Attending: Hematology

## 2023-07-10 ENCOUNTER — Inpatient Hospital Stay: Payer: Medicaid Other | Admitting: Hematology

## 2023-08-01 ENCOUNTER — Ambulatory Visit (INDEPENDENT_AMBULATORY_CARE_PROVIDER_SITE_OTHER): Payer: Medicaid Other | Admitting: Family

## 2023-08-01 ENCOUNTER — Other Ambulatory Visit (HOSPITAL_COMMUNITY)
Admission: RE | Admit: 2023-08-01 | Discharge: 2023-08-01 | Disposition: A | Discharge status: Home / Self Care | Payer: Medicaid Other | Source: Ambulatory Visit | Attending: Family | Admitting: Family

## 2023-08-01 VITALS — BP 125/87 | HR 104 | Temp 98.6°F | Ht 67.0 in | Wt 235.2 lb

## 2023-08-01 DIAGNOSIS — Z114 Encounter for screening for human immunodeficiency virus [HIV]: Secondary | ICD-10-CM | POA: Diagnosis not present

## 2023-08-01 DIAGNOSIS — Z113 Encounter for screening for infections with a predominantly sexual mode of transmission: Secondary | ICD-10-CM | POA: Insufficient documentation

## 2023-08-01 NOTE — Progress Notes (Signed)
 Patient states her partners MyChart was positive for BV,  Asking for everything to be checked to just be sure.

## 2023-08-01 NOTE — Progress Notes (Signed)
 Patient ID: LANIAH GRIMM, female    DOB: May 05, 1986  MRN: [id number]  CC: STD Screening  Subjective: Sarah Washington is a 38 y.o. female who presents for STD screening.   Her concerns today include:  Routine STD screening. States recent exposure to bacterial vaginitis. No further issues/concerns for discussion today.  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 90 tablet 0   hydrochlorothiazide (HYDRODIURIL) 25 MG tablet Take 1 tablet (25 mg total) by mouth daily. 90 tablet 0   pantoprazole (PROTONIX) 40 MG tablet TAKE 1 TABLET(40 MG) BY MOUTH DAILY BEFORE BREAKFAST 90 tablet 1   ibuprofen (ADVIL) 800 MG tablet Take 1 tablet (800 mg total) by mouth every 8 (eight) hours. (Patient not taking: Reported on 08/01/2023) 30 tablet 1   Multiple Vitamin (MULTIVITAMIN WITH MINERALS) TABS tablet Take 1 tablet by mouth daily. (Patient not taking: Reported on 02/27/2023)     oxyCODONE-acetaminophen (PERCOCET/ROXICET) 5-325 MG tablet Take 1 tablet by mouth every 4 (four) hours as needed for moderate pain. (Patient not taking: Reported on 08/01/2023) 30 tablet 0   sertraline (ZOLOFT)  25 MG tablet Take 1 tablet (25 mg total) by mouth daily. (Patient not taking: Reported on 08/29/2022) 30 tablet 2   No current facility-administered medications on file prior to visit.    Allergies  Allergen Reactions   Sulfa Antibiotics Hives    Bactrim    Social History   Socioeconomic History   Marital status: Single    Spouse name: Not on file   Number of children: 3   Years of education: Not on file   Highest education level: Not on file  Occupational History   Not on file  Tobacco Use   Smoking status: Never    Passive exposure: Current   Smokeless tobacco: Never  Vaping Use   Vaping status: Never Used  Substance and Sexual Activity   Alcohol use: Yes    Comment: occasional wine   Drug use: Yes    Types: Marijuana    Comment: last use 08/22/22   Sexual activity: Yes    Birth control/protection: Surgical    Comment: tubal ligation  Other Topics Concern   Not on file  Social History Narrative   Not on file   Social Drivers of Health   Financial Resource Strain: Low Risk  (02/27/2023)   Overall Financial Resource Strain (CARDIA)    Difficulty of Paying Living Expenses: Not very hard  Food Insecurity: No Food Insecurity (02/27/2023)   Hunger Vital Sign    Worried About Running Out of Food in the Last Year: Never true    Ran Out of Food in the Last Year: Never true  Transportation  Needs: No Transportation Needs (02/27/2023)   PRAPARE - Administrator, Civil Service (Medical): No    Lack of Transportation (Non-Medical): No  Physical Activity: Sufficiently Active (02/27/2023)   Exercise Vital Sign    Days of Exercise per Week: 6 days    Minutes of Exercise per Session: 30 min  Stress: No Stress Concern Present (02/27/2023)   [employer] of Occupational Health - Occupational Stress Questionnaire    Feeling of Stress : Not at all  Social Connections: Socially Integrated (02/27/2023)   Social Connection and Isolation Panel [NHANES]    Frequency of  Communication with Friends and Family: More than three times a week    Frequency of Social Gatherings with Friends and Family: More than three times a week    Attends Religious Services: More than 4 times per year    Active Member of Clubs or Organizations: Not on file    Attends Banker Meetings: More than 4 times per year    Marital Status: Married  Catering manager Violence: Not At Risk (02/27/2023)   Humiliation, Afraid, Rape, and Kick questionnaire    Fear of Current or Ex-Partner: No    Emotionally Abused: No    Physically Abused: No    Sexually Abused: No    Family History  Problem Relation Age of Onset   Hypertension Mother    Diabetes Mother    Diabetes Father    Hypertension Father    Mental illness Father    Arthritis Maternal Grandmother    Cancer Maternal Grandmother    Diabetes Maternal Grandmother    Hypertension Maternal Grandmother    Stroke Maternal Grandmother    Breast cancer Maternal Grandmother    Stroke Maternal Grandfather    Arthritis Paternal Grandmother    Cancer Paternal Grandmother    Diabetes Paternal Grandmother    Hypertension Paternal Grandmother    Breast cancer Paternal Grandmother     Past Surgical History:  Procedure Laterality Date   boils removed Bilateral    x 2 - groin area - one on rright and one on left with anesthesia   CYST EXCISION  08/29/2022   Procedure: CYST REMOVAL OF LEFT INNER THIGH;  Surgeon: Hermina Staggers, MD;  Location: [facility] OR;  Service: Gynecology;;   TUBAL LIGATION     VAGINAL HYSTERECTOMY Bilateral 08/29/2022   Procedure: VAGINAL HYSTERECTOMY;  Surgeon: Hermina Staggers, MD;  Location: [facility] OR;  Service: Gynecology;  Laterality: Bilateral;    ROS: Review of Systems Negative except as stated above  PHYSICAL EXAM: BP 125/87   Pulse (!) 104   Temp 98.6 F (37 C) (Oral)   Ht 5\' 7"  (1.702 m)   Wt 235 lb 3.2 oz (106.7 kg)   SpO2 94%   BMI 36.84 kg/m   Physical Exam HENT:     Head:  Normocephalic and atraumatic.     Nose: Nose normal.     Mouth/Throat:     Mouth: Mucous membranes are moist.     Pharynx: Oropharynx is clear.  Eyes:     Extraocular Movements: Extraocular movements intact.     Conjunctiva/sclera: Conjunctivae normal.     Pupils: Pupils are equal, round, and reactive to light.  Cardiovascular:     Rate and Rhythm: Tachycardia present.     Pulses: Normal pulses.     Heart sounds: Normal heart sounds.  Pulmonary:     Effort: Pulmonary effort is normal.     Breath sounds: Normal breath  sounds.  Musculoskeletal:        General: Normal range of motion.     Cervical back: Normal range of motion and neck supple.  Neurological:     General: No focal deficit present.     Mental Status: She is alert and oriented to person, place, and time.  Psychiatric:        Mood and Affect: Mood normal.        Behavior: Behavior normal.     ASSESSMENT AND PLAN: 1. Routine screening for STI (sexually transmitted infection) (Primary) - Routine screening.  - Cervicovaginal ancillary only - RPR  2. Encounter for screening for HIV - Routine screening.  - HIV antibody (with reflex)   Patient was given the opportunity to ask questions.  Patient verbalized understanding of the plan and was able to repeat key elements of the plan. Patient was given clear instructions to go to Emergency Department or return to medical center if symptoms don't improve, worsen, or new problems develop.The patient verbalized understanding.   Orders Placed This Encounter  Procedures   HIV antibody (with reflex)   RPR     Return for Follow-Up or next available with Georganna Skeans, MD .  Rema Fendt, NP

## 2023-08-02 ENCOUNTER — Encounter: Payer: Self-pay | Admitting: Family

## 2023-08-02 LAB — CERVICOVAGINAL ANCILLARY ONLY
Bacterial Vaginitis (gardnerella): POSITIVE — AB
Candida Glabrata: NEGATIVE
Candida Vaginitis: NEGATIVE
Chlamydia: NEGATIVE
Comment: NEGATIVE
Comment: NEGATIVE
Comment: NEGATIVE
Comment: NEGATIVE
Comment: NEGATIVE
Comment: NORMAL
Neisseria Gonorrhea: NEGATIVE
Trichomonas: POSITIVE — AB

## 2023-08-02 LAB — RPR: RPR Ser Ql: NONREACTIVE

## 2023-08-02 LAB — HIV ANTIBODY (ROUTINE TESTING W REFLEX): HIV Screen 4th Generation wRfx: NONREACTIVE

## 2023-08-03 ENCOUNTER — Other Ambulatory Visit: Payer: Self-pay | Admitting: Family

## 2023-08-03 DIAGNOSIS — A5901 Trichomonal vulvovaginitis: Secondary | ICD-10-CM

## 2023-08-03 DIAGNOSIS — B9689 Other specified bacterial agents as the cause of diseases classified elsewhere: Secondary | ICD-10-CM

## 2023-08-03 MED ORDER — METRONIDAZOLE 500 MG PO TABS: 500.0000 mg | ORAL_TABLET | Freq: Two times a day (BID) | ORAL | 0 refills | Status: AC

## 2023-08-06 ENCOUNTER — Inpatient Hospital Stay (HOSPITAL_BASED_OUTPATIENT_CLINIC_OR_DEPARTMENT_OTHER): Payer: Medicaid Other | Admitting: Hematology

## 2023-08-06 ENCOUNTER — Inpatient Hospital Stay: Payer: Medicaid Other | Attending: Hematology

## 2023-08-06 VITALS — BP 141/92 | HR 94 | Temp 97.2°F | Resp 18 | Wt 240.7 lb

## 2023-08-06 DIAGNOSIS — D509 Iron deficiency anemia, unspecified: Secondary | ICD-10-CM

## 2023-08-06 DIAGNOSIS — M3119 Other thrombotic microangiopathy: Secondary | ICD-10-CM

## 2023-08-06 DIAGNOSIS — Z79899 Other long term (current) drug therapy: Secondary | ICD-10-CM | POA: Insufficient documentation

## 2023-08-06 LAB — CMP (CANCER CENTER ONLY)
ALT: 10 U/L (ref 0–44)
AST: 11 U/L — ABNORMAL LOW (ref 15–41)
Albumin: 3.6 g/dL (ref 3.5–5.0)
Alkaline Phosphatase: 53 U/L (ref 38–126)
Anion gap: 4 — ABNORMAL LOW (ref 5–15)
BUN: 12 mg/dL (ref 6–20)
CO2: 29 mmol/L (ref 22–32)
Calcium: 8.6 mg/dL — ABNORMAL LOW (ref 8.9–10.3)
Chloride: 107 mmol/L (ref 98–111)
Creatinine: 0.88 mg/dL (ref 0.44–1.00)
GFR, Estimated: >60 mL/min (ref >60)
Glucose, Bld: 84 mg/dL (ref 70–99)
Potassium: 3.4 mmol/L — ABNORMAL LOW (ref 3.5–5.1)
Sodium: 140 mmol/L (ref 135–145)
Total Bilirubin: 0.3 mg/dL (ref 0.0–1.2)
Total Protein: 7.6 g/dL (ref 6.5–8.1)

## 2023-08-06 LAB — CBC WITH DIFFERENTIAL (CANCER CENTER ONLY)
Abs Immature Granulocytes: 0.02 K/uL (ref 0.00–0.07)
Basophils Absolute: 0.1 K/uL (ref 0.0–0.1)
Basophils Relative: 1 %
Eosinophils Absolute: 0.2 K/uL (ref 0.0–0.5)
Eosinophils Relative: 2 %
HCT: 38.1 % (ref 36.0–46.0)
Hemoglobin: 12.7 g/dL (ref 12.0–15.0)
Immature Granulocytes: 0 %
Lymphocytes Relative: 39 %
Lymphs Abs: 3.2 K/uL (ref 0.7–4.0)
MCH: 29.2 pg (ref 26.0–34.0)
MCHC: 33.3 g/dL (ref 30.0–36.0)
MCV: 87.6 fL (ref 80.0–100.0)
Monocytes Absolute: 0.5 K/uL (ref 0.1–1.0)
Monocytes Relative: 6 %
Neutro Abs: 4.4 K/uL (ref 1.7–7.7)
Neutrophils Relative %: 52 %
Platelet Count: 304 K/uL (ref 150–400)
RBC: 4.35 MIL/uL (ref 3.87–5.11)
RDW: 13.2 % (ref 11.5–15.5)
WBC Count: 8.3 K/uL (ref 4.0–10.5)
nRBC: 0 % (ref 0.0–0.2)

## 2023-08-06 LAB — IRON AND IRON BINDING CAPACITY (CC-WL,HP ONLY)
Iron: 53 ug/dL (ref 28–170)
Saturation Ratios: 17 % (ref 10.4–31.8)
TIBC: 304 ug/dL (ref 250–450)
UIBC: 251 ug/dL (ref 148–442)

## 2023-08-06 LAB — FERRITIN: Ferritin: 66 ng/mL (ref 11–307)

## 2023-08-07 ENCOUNTER — Telehealth: Payer: Self-pay | Admitting: Hematology

## 2023-08-07 NOTE — Telephone Encounter (Signed)
 Left patient a message in regards to scheduled appointment times/dates, left callback number for scheduling if patient is needing to cancel or reschedule appointments

## 2023-08-13 ENCOUNTER — Encounter: Payer: Self-pay | Admitting: Hematology

## 2023-08-13 NOTE — Progress Notes (Signed)
 HEMATOLOGY/ONCOLOGY CLINIC VISIT NOTE  Date of Service: 08/13/23  Patient Care Team: Georganna Skeans, MD as PCP - General (Family Medicine) Caryl Pina, PA-C as Consulting Physician (Dermatology)  CHIEF COMPLAINTS/PURPOSE OF CONSULTATION:  Follow-up for continued evaluation and management of TTP  HISTORY OF PRESENTING ILLNESS:    INTERVAL HISTORY:    Sarah Washington for follow-up of her history of TTP.   She notes no acute new symptoms since last clinic visit. She had a hystectomy in 08/2022 sh she does not have any menstrual losses at this time. 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 She is slowly moving on from the loss of her son. No other acute new symptoms.  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    SURGICAL HISTORY: Past Surgical History:  Procedure Laterality Date   boils removed Bilateral    x 2 - groin area - one on rright and one on left with anesthesia   CYST EXCISION  08/29/2022   Procedure: CYST REMOVAL OF LEFT INNER THIGH;  Surgeon: Hermina Staggers, MD;  Location: [facility] OR;  Service: Gynecology;;   TUBAL LIGATION     VAGINAL HYSTERECTOMY Bilateral 08/29/2022   Procedure: VAGINAL HYSTERECTOMY;  Surgeon: Hermina Staggers, MD;  Location: [facility] OR;  Service: Gynecology;  Laterality: Bilateral;  tubal ligation in 2010  SOCIAL HISTORY: Social History   Socioeconomic History   Marital status: Single    Spouse name: Not on file   Number of children: 3   Years of education: Not on file   Highest education level: Not on file  Occupational History   Not on file  Tobacco Use   Smoking status: Never    Passive exposure: Current   Smokeless tobacco: Never  Vaping Use   Vaping status: Never Used  Substance and Sexual Activity   Alcohol use: Yes    Comment: occasional wine   Drug use: Yes    Types: Marijuana    Comment: last use 08/22/22    Sexual activity: Yes    Birth control/protection: Surgical    Comment: tubal ligation  Other Topics Concern   Not on file  Social History Narrative   Not on file   Social Drivers of Health   Financial Resource Strain: Low Risk  (02/27/2023)   Overall Financial Resource Strain (CARDIA)    Difficulty of Paying Living Expenses: Not very hard  Food Insecurity: No Food Insecurity (02/27/2023)   Hunger Vital Sign    Worried About Running Out of Food in the Last Year: Never true    Ran Out of Food in the Last Year: Never true  Transportation Needs: No Transportation Needs (02/27/2023)   PRAPARE - Administrator, Civil Service (Medical): No    Lack of Transportation (Non-Medical): No  Physical Activity: Sufficiently Active (02/27/2023)   Exercise Vital Sign    Days of Exercise per Week: 6 days    Minutes of Exercise per Session: 30 min  Stress: No Stress Concern Present (02/27/2023)   [employer] of Occupational Health - Occupational Stress Questionnaire    Feeling of Stress : Not at all  Social Connections: Socially Integrated (02/27/2023)   Social Connection and Isolation Panel [NHANES]    Frequency of Communication with Friends and Family: More than three times a week    Frequency of Social Gatherings with Friends and Family: More than three  times a week    Attends Religious Services: More than 4 times per year    Active Member of Clubs or Organizations: Not on file    Attends Club or Organization Meetings: More than 4 times per year    Marital Status: Married  Catering manager Violence: Not At Risk (02/27/2023)   Humiliation, Afraid, Rape, and Kick questionnaire    Fear of Current or Ex-Partner: No    Emotionally Abused: No    Physically Abused: No    Sexually Abused: No    FAMILY HISTORY: Family History  Problem Relation Age of Onset   Hypertension Mother    Diabetes Mother    Diabetes Father    Hypertension Father    Mental illness Father    Arthritis  Maternal Grandmother    Cancer Maternal Grandmother    Diabetes Maternal Grandmother    Hypertension Maternal Grandmother    Stroke Maternal Grandmother    Breast cancer Maternal Grandmother    Stroke Maternal Grandfather    Arthritis Paternal Grandmother    Cancer Paternal Grandmother    Diabetes Paternal Grandmother    Hypertension Paternal Grandmother    Breast cancer Paternal Grandmother     ALLERGIES:  is allergic to sulfa antibiotics.  MEDICATIONS:  Current Outpatient Medications  Medication Sig Dispense Refill   amLODipine (NORVASC) 2.5 MG tablet Take 1 tablet (2.5 mg total) by mouth daily. 90 tablet 0   hydrochlorothiazide (HYDRODIURIL) 25 MG tablet Take 1 tablet (25 mg total) by mouth daily. 90 tablet 0   ibuprofen (ADVIL) 800 MG tablet Take 1 tablet (800 mg total) by mouth every 8 (eight) hours. (Patient not taking: Reported on 08/01/2023) 30 tablet 1   Multiple Vitamin (MULTIVITAMIN WITH MINERALS) TABS tablet Take 1 tablet by mouth daily. (Patient not taking: Reported on 02/27/2023)     oxyCODONE-acetaminophen (PERCOCET/ROXICET) 5-325 MG tablet Take 1 tablet by mouth every 4 (four) hours as needed for moderate pain. (Patient not taking: Reported on 08/01/2023) 30 tablet 0   pantoprazole (PROTONIX) 40 MG tablet TAKE 1 TABLET(40 MG) BY MOUTH DAILY BEFORE BREAKFAST 90 tablet 1   sertraline (ZOLOFT) 25 MG tablet Take 1 tablet (25 mg total) by mouth daily. (Patient not taking: Reported on 08/29/2022) 30 tablet 2   No current facility-administered medications for this visit.    REVIEW OF SYSTEMS:   .10 Point review of Systems was done is negative except as noted above.   PHYSICAL EXAMINATION: ECOG FS:1 - Symptomatic but completely ambulatory  Vitals:   08/06/23 0929  BP: (!) 141/92  Pulse: 94  Resp: 18  Temp: (!) 97.2 F (36.2 C)  SpO2: 99%   Wt Readings from Last 3 Encounters:  08/06/23 240 lb 11.2 oz (109.2 kg)  08/01/23 235 lb 3.2 oz (106.7 kg)  02/27/23 228 lb  12.8 oz (103.8 kg)   Body mass index is 37.7 kg/m.   Marland Kitchen GENERAL:alert, in no acute distress and comfortable SKIN: no acute rashes, no significant lesions EYES: conjunctiva are pink and non-injected, sclera anicteric OROPHARYNX: MMM, no exudates, no oropharyngeal erythema or ulceration NECK: supple, no JVD LYMPH:  no palpable lymphadenopathy in the cervical, axillary or inguinal regions LUNGS: clear to auscultation b/l with normal respiratory effort HEART: regular rate & rhythm ABDOMEN:  normoactive bowel sounds , non tender, not distended. Extremity: no pedal edema PSYCH: alert & oriented x 3 with fluent speech NEURO: no focal motor/sensory deficits    LABORATORY DATA:  I have reviewed the data as  listed .    Latest Ref Rng & Units 08/06/2023    9:17 AM 01/29/2023    9:38 AM 08/30/2022    5:36 AM  CBC  WBC 4.0 - 10.5 K/uL 8.3  8.4  12.2   Hemoglobin 12.0 - 15.0 g/dL 16.1  09.6  04.5   Hematocrit 36.0 - 46.0 % 38.1  36.9  34.0   Platelets 150 - 400 K/uL 304  164  330       Latest Ref Rng & Units 08/06/2023    9:17 AM 01/29/2023    9:38 AM 08/30/2022    5:36 AM  CMP  Glucose 70 - 99 mg/dL 84  409  811   BUN 6 - 20 mg/dL 12  12  7    Creatinine 0.44 - 1.00 mg/dL 9.14  7.82  9.56   Sodium 135 - 145 mmol/L 140  138  134   Potassium 3.5 - 5.1 mmol/L 3.4  3.9  3.7   Chloride 98 - 111 mmol/L 107  107  96   CO2 22 - 32 mmol/L 29  26  27    Calcium 8.9 - 10.3 mg/dL 8.6  9.0  8.7   Total Protein 6.5 - 8.1 g/dL 7.6  7.9    Total Bilirubin 0.0 - 1.2 mg/dL 0.3  0.2    Alkaline Phos 38 - 126 U/L 53  56    AST 15 - 41 U/L 11  10    ALT 0 - 44 U/L 10  11     . Lab Results  Component Value Date   IRON 53 08/06/2023   TIBC 304 08/06/2023   IRONPCTSAT 17 08/06/2023   (Iron and TIBC)  Lab Results  Component Value Date   FERRITIN 66 08/06/2023      RADIOGRAPHIC STUDIES: I have personally reviewed the radiological images as listed and agreed with the findings in the report. No  results found.  ASSESSMENT & PLAN:   38 y.o. female with hydradenitis suppurativa and   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  Severely low ADAMTS 13 levels of 6 on diagnosis. Now normalized to 69 ( ADAMTS13 from 11/26/17 ) S/p Plasmapheresis x 5. Completed Rituxan weekly x 4 doses. On labs today patient has no evidence of TTP relapse at this time.  Normal platelets of 356. Adam TS 13 activity level of 53%  2.  Anemia-microcytic.  Likely due to iron deficiency.   PLAN: -Discussed lab results from today, 08/06/2023 CBC wnl CMP wnl other than K of 3.4. Patient recommended to consume K and iron rich diet - no more menstrual losses but is still on PPI for acid suppression. -Patient has no lab or clinical evidence of TTP relapse at this time. -Continue iron polysaccharide 150 mg p.o. atleast 3 times weekly to maintain ferritin levels>50   FOLLOW-UP:  Phone visit with Dr Candise Che in 6 months Labs 1-2 days prior to phone visit   .The total time spent in the appointment was 20 minutes* .  All of the patient's questions were answered with apparent satisfaction. The patient knows to call the clinic with any problems, questions or concerns.   Wyvonnia Lora MD MS AAHIVMS [facility] Hematology/Oncology Physician [facility]  .*Total Encounter Time as defined by the Centers for Medicare and Medicaid Services includes, in addition to the face-to-face time of a patient visit (documented in the note above) non-face-to-face time: obtaining and reviewing outside history, ordering and reviewing medications, tests or procedures,  care coordination (communications with other health care professionals or caregivers) and documentation in the medical record.

## 2023-09-06 ENCOUNTER — Other Ambulatory Visit: Payer: Self-pay

## 2023-09-06 ENCOUNTER — Other Ambulatory Visit (HOSPITAL_COMMUNITY)
Admission: RE | Admit: 2023-09-06 | Discharge: 2023-09-06 | Disposition: A | Discharge status: Home / Self Care | Source: Ambulatory Visit | Attending: Obstetrics and Gynecology | Admitting: Obstetrics and Gynecology

## 2023-09-06 ENCOUNTER — Ambulatory Visit: Payer: Medicaid Other | Admitting: Obstetrics and Gynecology

## 2023-09-06 VITALS — BP 122/89 | HR 82 | Wt 227.0 lb

## 2023-09-06 DIAGNOSIS — N871 Moderate cervical dysplasia: Secondary | ICD-10-CM | POA: Diagnosis present

## 2023-09-06 DIAGNOSIS — Z01419 Encounter for gynecological examination (general) (routine) without abnormal findings: Secondary | ICD-10-CM | POA: Diagnosis not present

## 2023-09-06 DIAGNOSIS — Z1331 Encounter for screening for depression: Secondary | ICD-10-CM

## 2023-09-06 DIAGNOSIS — Z1231 Encounter for screening mammogram for malignant neoplasm of breast: Secondary | ICD-10-CM | POA: Diagnosis not present

## 2023-09-06 DIAGNOSIS — Z113 Encounter for screening for infections with a predominantly sexual mode of transmission: Secondary | ICD-10-CM

## 2023-09-06 NOTE — Progress Notes (Signed)
 ANNUAL EXAM Patient name: Sarah Washington MRN [id number]  Date of birth: [birth year] Chief Complaint:   Gynecologic Exam  History of Present Illness:   Sarah Washington is a 38 y.o. [id number] being seen today for a routine annual exam.  Current complaints: annual, STI testing  Discussed the use of AI scribe software for clinical note transcription with the patient, who gave verbal consent to proceed.  History of Present Illness Sarah Washington is a 38 year old female who presents for STI testing and Pap smear.  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 She understands the need for continued Pap smears for 25 years following the detection of CIN2. No problems have been reported since her surgery a year ago.  She is currently sexually active and denies experiencing any pain with intercourse. She is here for STI testing as part of her annual examination. No breast or nipple complaints are present, although she mentions having had a boil under her breast about a month ago, which resolved on its own. She denies any nipple discharge.  She has a history of hidradenitis suppurativa and experiences recurrent boils, particularly in her axillae and groin. She describes these as sometimes being under the skin and sometimes on top. She has been following up with a dermatologist but is considering finding a new one as she is dissatisfied with her current care. She has an upcoming appointment and plans to give her current dermatologist another chance. She is interested in excision of her groin HS as it makes it difficult for her to wear underwear whenever she has a boil.   She reports increased constipation since her surgery, noting that she now frequently needs to take something to aid her bowel movements, which was not necessary before. She wonders if this change is related to her surgery or aging.    Patient's last menstrual period was 08/19/2022  (exact date).  Last pap     Component Value Date/Time   DIAGPAP  05/01/2022 1617    - Negative for Intraepithelial Lesions or Malignancy (NILM)   DIAGPAP - Benign reactive/reparative changes 05/01/2022 1617   DIAGPAP - Low grade squamous intraepithelial lesion (LSIL) (A) 08/11/2019 0945   HPVHIGH Positive (A) 05/01/2022 1617   HPVHIGH Positive (A) 08/11/2019 0945   ADEQPAP  05/01/2022 1617    Satisfactory for evaluation; transformation zone component PRESENT.   ADEQPAP  08/11/2019 0945    Satisfactory for evaluation; transformation zone component PRESENT.     H/O abnormal pap: yes Last mammogram: . 09/2021 BIRADS 3 Last colonoscopy: n/a.      02/27/2023    1:18 PM 02/27/2023   10:28 AM 04/21/2022    9:31 AM 03/24/2022    9:05 AM 09/20/2020    9:59 AM  Depression screen PHQ 2/9  Decreased Interest 0 0 3 3 2   Down, Depressed, Hopeless 0 0 3 3 0  PHQ - 2 Score 0 0 6 6 2   Altered sleeping 0  3 3 2   Tired, decreased energy 0  3 3 2   Change in appetite 0  3 1 0  Feeling bad or failure about yourself  0  3 3 0  Trouble concentrating 0  3 3 2   Moving slowly or fidgety/restless 0  0 0 0  Suicidal thoughts 0  0 0 0  PHQ-9 Score 0  21 19 8   Difficult doing work/chores Not difficult at all  Somewhat  difficult Extremely dIfficult         02/27/2023   10:28 AM 03/24/2022    9:07 AM 09/20/2020   10:00 AM 03/11/2020    8:16 AM  GAD 7 : Generalized Anxiety Score  Nervous, Anxious, on Edge 0 3 2 1   Control/stop worrying 0 3 2 1   Worry too much - different things 0 3 2 1   Trouble relaxing 0 3 2 1   Restless 0 0 0 1  Easily annoyed or irritable 0 3 2 1   Afraid - awful might happen 0 2 0 1  Total GAD 7 Score 0 17 10 7   Anxiety Difficulty Not difficult at all Extremely difficult       Review of Systems:   Pertinent items are noted in HPI Denies any headaches, blurred vision, fatigue, shortness of breath, chest pain, abdominal pain, abnormal vaginal discharge/itching/odor/irritation,  problems with periods, bowel movements, urination, or intercourse unless otherwise stated above. Pertinent History Reviewed:  Reviewed past medical,surgical, social and family history.  Reviewed problem list, medications and allergies. Physical Assessment:   Vitals:   09/06/23 1008  BP: 122/89  Pulse: 82  Weight: 227 lb (103 kg)  Body mass index is 35.55 kg/m.        Physical Examination:   General appearance - well appearing, and in no distress  Mental status - alert, oriented to person, place, and time  Psych:  She has a normal mood and affect  Skin - warm and dry, normal color, no suspicious lesions noted  Chest - effort normal, all lung fields clear to auscultation bilaterally  Heart - normal rate and regular rhythm  Breasts - breasts appear normal, no suspicious masses, no skin or nipple changes, hyperpigmented nontender lesion on inferior left breast, lesion noted within axillae  Abdomen - soft, nontender, nondistended, no masses or organomegaly  Pelvic -  VULVA: normal appearing vulva with no masses, tenderness or lesions   VAGINA: normal appearing vagina with normal color and discharge, no lesions, intact vaginal cuff  Thin prep pap is done with HR HPV cotesting  Extremities:  No swelling or varicosities noted  Chaperone present for exam  No results found for this or any previous visit (from the past 24 hours).    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 - Perform Pap smear to monitor for abnormalities.  Well Woman Exam - Cervical cancer screening: Discussed guidelines. Pap with HPV collected given prior hx of CIN 2 - STD Testing: accepts - Birth Control: Discussed options and their risks, benefits and common side effects; discussed VTE with estrogen containing options. Desires:  s/p hysterectomy - Breast Health: Encouraged self  breast awareness/SBE. Teaching provided. Discussed limits of clinical breast exam for detecting breast cancer. Rx given for MXR - F/U 12 months and prn  STI Screening Annual STI testing required due to sexual activity. - Perform STI testing including blood work and swab.  Hidradenitis Suppurativa (HS) Recurrent submammary boils consistent with HS. Dissatisfaction with current management and desire for lesion excision. - Encourage exploration of HS support groups for specialist recommendations. - Follow up with current dermatologist and consider second opinion if care is unsatisfactory. - Trial of topical clindamycin for symptoms  Constipation Increased constipation post-hysterectomy, likely age-related. - Monitor bowel habits and consider dietary modifications or over-the-counter remedies as needed.    Orders Placed This Encounter  Procedures   HIV antibody (with reflex)  Hepatitis B surface antigen   Hepatitis C antibody   RPR    Meds: No orders of the defined types were placed in this encounter.   Follow-up: Return if symptoms worsen or fail to improve, for Annual GYN.  Lorriane Shire, MD 09/06/2023 10:14 AM

## 2023-09-07 ENCOUNTER — Encounter: Payer: Self-pay | Admitting: Obstetrics and Gynecology

## 2023-09-07 ENCOUNTER — Other Ambulatory Visit: Payer: Self-pay | Admitting: Obstetrics and Gynecology

## 2023-09-07 DIAGNOSIS — B9689 Other specified bacterial agents as the cause of diseases classified elsewhere: Secondary | ICD-10-CM

## 2023-09-07 LAB — CERVICOVAGINAL ANCILLARY ONLY
Bacterial Vaginitis (gardnerella): POSITIVE — AB
Candida Glabrata: NEGATIVE
Candida Vaginitis: NEGATIVE
Chlamydia: NEGATIVE
Comment: NEGATIVE
Comment: NEGATIVE
Comment: NEGATIVE
Comment: NEGATIVE
Comment: NEGATIVE
Comment: NORMAL
Neisseria Gonorrhea: NEGATIVE
Trichomonas: NEGATIVE

## 2023-09-07 LAB — HEPATITIS B SURFACE ANTIGEN: Hepatitis B Surface Ag: NEGATIVE

## 2023-09-07 LAB — HEPATITIS C ANTIBODY: Hep C Virus Ab: NONREACTIVE

## 2023-09-07 LAB — HIV ANTIBODY (ROUTINE TESTING W REFLEX): HIV Screen 4th Generation wRfx: NONREACTIVE

## 2023-09-07 LAB — RPR: RPR Ser Ql: NONREACTIVE

## 2023-09-07 MED ORDER — METRONIDAZOLE 500 MG PO TABS: 500.0000 mg | ORAL_TABLET | Freq: Two times a day (BID) | ORAL | 0 refills | Status: AC

## 2023-09-12 LAB — CYTOLOGY - PAP
Comment: NEGATIVE
Comment: NEGATIVE
Comment: NEGATIVE
Diagnosis: UNDETERMINED — AB
HPV 16: NEGATIVE
HPV 18 / 45: NEGATIVE
High risk HPV: POSITIVE — AB

## 2023-09-13 ENCOUNTER — Encounter: Payer: Self-pay | Admitting: Obstetrics and Gynecology

## 2023-09-13 ENCOUNTER — Telehealth: Payer: Self-pay | Admitting: General Practice

## 2023-09-13 NOTE — Telephone Encounter (Signed)
 Patient called into office and left message on nurse voicemail line requesting a call back to discuss her results. Called patient, no answer- left message stating we are returning her phone call & to call us back if she still has questions

## 2023-09-13 NOTE — Telephone Encounter (Signed)
 Called patient back at her request. 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 Patient voiced concerns about informing her partner and risk of cancer. Discussed HPV could have been contracted years ago and her partner has likely already been exposed through her or someone else. Discussed the cancer is slow growing and a one year follow up is the recommended time frame & that is safe to do so. Discussed we are looking for precancerous cells and if needed we would treat them to prevent cancer from happening. 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 Discussed prevention strategies with patient. Patient verbalized understanding and had no other questions at this time.

## 2023-10-05 ENCOUNTER — Other Ambulatory Visit: Payer: Self-pay | Admitting: Obstetrics and Gynecology

## 2023-10-05 DIAGNOSIS — Z1231 Encounter for screening mammogram for malignant neoplasm of breast: Secondary | ICD-10-CM

## 2023-10-05 DIAGNOSIS — Z01419 Encounter for gynecological examination (general) (routine) without abnormal findings: Secondary | ICD-10-CM

## 2023-10-05 DIAGNOSIS — Z113 Encounter for screening for infections with a predominantly sexual mode of transmission: Secondary | ICD-10-CM

## 2023-10-05 DIAGNOSIS — N871 Moderate cervical dysplasia: Secondary | ICD-10-CM

## 2023-10-05 DIAGNOSIS — N6489 Other specified disorders of breast: Secondary | ICD-10-CM

## 2023-10-11 ENCOUNTER — Encounter: Payer: Self-pay | Admitting: Obstetrics and Gynecology

## 2023-10-11 ENCOUNTER — Ambulatory Visit
Admission: RE | Admit: 2023-10-11 | Discharge: 2023-10-11 | Disposition: A | Discharge status: Home / Self Care | Source: Ambulatory Visit | Attending: Obstetrics and Gynecology | Admitting: Obstetrics and Gynecology

## 2023-10-11 DIAGNOSIS — N6489 Other specified disorders of breast: Secondary | ICD-10-CM

## 2023-11-02 ENCOUNTER — Other Ambulatory Visit: Payer: Self-pay | Admitting: Hematology

## 2023-11-02 DIAGNOSIS — M3119 Other thrombotic microangiopathy: Secondary | ICD-10-CM

## 2023-11-05 ENCOUNTER — Encounter: Payer: Self-pay | Admitting: Hematology

## 2024-01-09 ENCOUNTER — Encounter: Payer: Self-pay | Admitting: Hematology

## 2024-02-05 ENCOUNTER — Encounter: Payer: Self-pay | Admitting: Hematology

## 2024-02-11 ENCOUNTER — Other Ambulatory Visit: Payer: Self-pay

## 2024-02-11 DIAGNOSIS — M3119 Other thrombotic microangiopathy: Secondary | ICD-10-CM

## 2024-02-12 ENCOUNTER — Inpatient Hospital Stay: Attending: Hematology

## 2024-02-12 DIAGNOSIS — D509 Iron deficiency anemia, unspecified: Secondary | ICD-10-CM | POA: Insufficient documentation

## 2024-02-12 DIAGNOSIS — Z79899 Other long term (current) drug therapy: Secondary | ICD-10-CM | POA: Diagnosis not present

## 2024-02-12 DIAGNOSIS — M3119 Other thrombotic microangiopathy: Secondary | ICD-10-CM | POA: Diagnosis present

## 2024-02-12 LAB — CMP (CANCER CENTER ONLY)
ALT: 11 U/L (ref 0–44)
AST: 10 U/L — ABNORMAL LOW (ref 15–41)
Albumin: 3.9 g/dL (ref 3.5–5.0)
Alkaline Phosphatase: 59 U/L (ref 38–126)
Anion gap: 6 (ref 5–15)
BUN: 12 mg/dL (ref 6–20)
CO2: 26 mmol/L (ref 22–32)
Calcium: 9.1 mg/dL (ref 8.9–10.3)
Chloride: 106 mmol/L (ref 98–111)
Creatinine: 0.86 mg/dL (ref 0.44–1.00)
GFR, Estimated: >60 mL/min (ref >60)
Glucose, Bld: 90 mg/dL (ref 70–99)
Potassium: 4.1 mmol/L (ref 3.5–5.1)
Sodium: 138 mmol/L (ref 135–145)
Total Bilirubin: 0.6 mg/dL (ref 0.0–1.2)
Total Protein: 8.1 g/dL (ref 6.5–8.1)

## 2024-02-12 LAB — CBC WITH DIFFERENTIAL (CANCER CENTER ONLY)
Abs Immature Granulocytes: 0.03 K/uL (ref 0.00–0.07)
Basophils Absolute: 0 K/uL (ref 0.0–0.1)
Basophils Relative: 1 %
Eosinophils Absolute: 0.1 K/uL (ref 0.0–0.5)
Eosinophils Relative: 1 %
HCT: 39.5 % (ref 36.0–46.0)
Hemoglobin: 13.5 g/dL (ref 12.0–15.0)
Immature Granulocytes: 0 %
Lymphocytes Relative: 42 %
Lymphs Abs: 3.7 K/uL (ref 0.7–4.0)
MCH: 29.5 pg (ref 26.0–34.0)
MCHC: 34.2 g/dL (ref 30.0–36.0)
MCV: 86.2 fL (ref 80.0–100.0)
Monocytes Absolute: 0.6 K/uL (ref 0.1–1.0)
Monocytes Relative: 7 %
Neutro Abs: 4.3 K/uL (ref 1.7–7.7)
Neutrophils Relative %: 49 %
Platelet Count: 276 K/uL (ref 150–400)
RBC: 4.58 MIL/uL (ref 3.87–5.11)
RDW: 13.5 % (ref 11.5–15.5)
WBC Count: 8.8 K/uL (ref 4.0–10.5)
nRBC: 0 % (ref 0.0–0.2)

## 2024-02-12 LAB — IRON AND IRON BINDING CAPACITY (CC-WL,HP ONLY)
Iron: 59 ug/dL (ref 28–170)
Saturation Ratios: 19 % (ref 10.4–31.8)
TIBC: 315 ug/dL (ref 250–450)
UIBC: 256 ug/dL (ref 148–442)

## 2024-02-12 LAB — FERRITIN: Ferritin: 105 ng/mL (ref 11–307)

## 2024-02-15 ENCOUNTER — Inpatient Hospital Stay: Admitting: Physician Assistant

## 2024-02-15 DIAGNOSIS — M3119 Other thrombotic microangiopathy: Secondary | ICD-10-CM | POA: Diagnosis not present

## 2024-02-15 DIAGNOSIS — Z803 Family history of malignant neoplasm of breast: Secondary | ICD-10-CM | POA: Diagnosis not present

## 2024-02-15 DIAGNOSIS — D509 Iron deficiency anemia, unspecified: Secondary | ICD-10-CM | POA: Diagnosis not present

## 2024-02-15 DIAGNOSIS — Z809 Family history of malignant neoplasm, unspecified: Secondary | ICD-10-CM | POA: Diagnosis not present

## 2024-02-18 ENCOUNTER — Telehealth: Payer: Self-pay | Admitting: Hematology

## 2024-02-18 NOTE — Telephone Encounter (Signed)
 Scheduled patient for 6 months. Called and spoke with the patient regarding appointments, she is aware.

## 2024-02-19 ENCOUNTER — Encounter: Payer: Self-pay | Admitting: Hematology

## 2024-02-19 NOTE — Progress Notes (Signed)
 HEMATOLOGY/ONCOLOGY CLINIC VISIT NOTE  Patient Care Team: Tanda Bleacher, MD as PCP - General (Family Medicine) Veria Abigail HERO, PA-C as Consulting Physician (Dermatology)  I connected with Sarah Washington  on 02/15/2024 by telephone visit and verified that I am speaking with the correct person using two identifiers.   I discussed the limitations, risks, security and privacy concerns of performing an evaluation and management service by telemedicine and the availability of in-person appointments. I also discussed with the patient that there may be a patient responsible charge related to this service. The patient expressed understanding and agreed to proceed.   Patient's location: Home Provider's location: Office   CHIEF COMPLAINTS/PURPOSE OF CONSULTATION:  Follow-up for continued evaluation and management of TTP  INTERVAL HISTORY:    Sarah Washington presents for a virtual visit for history of TTP.  Sarah Washington reports she is doing well without any new or concerning symptoms. Her appetite and energy levels are stable. She denies easy bruising or signs of active bleeding including hematochezia or melena. She denies fevers, chills, sweats, shortness of breath, chest pain or cough. She has no other complaints.   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   SURGICAL HISTORY: Past Surgical History:  Procedure Laterality Date   boils removed Bilateral    x 2 - groin area - one on rright and one on left with anesthesia   CYST EXCISION  08/29/2022   Procedure: CYST REMOVAL OF LEFT INNER THIGH;  Surgeon: Lorence Ozell CROME, MD;  Location: [facility] OR;  Service: Gynecology;;   TUBAL LIGATION     VAGINAL HYSTERECTOMY Bilateral 08/29/2022   Procedure: VAGINAL HYSTERECTOMY;  Surgeon: Lorence Ozell CROME, MD;  Location: [facility] OR;  Service: Gynecology;  Laterality: Bilateral;  tubal ligation in 2010  SOCIAL HISTORY: Social  History   Socioeconomic History   Marital status: Single    Spouse name: Not on file   Number of children: 3   Years of education: Not on file   Highest education level: Not on file  Occupational History   Not on file  Tobacco Use   Smoking status: Never    Passive exposure: Current   Smokeless tobacco: Never  Vaping Use   Vaping status: Never Used  Substance and Sexual Activity   Alcohol use: Yes    Comment: occasional wine   Drug use: Yes    Types: Marijuana    Comment: last use 08/22/22   Sexual activity: Yes    Birth control/protection: Surgical    Comment: tubal ligation  Other Topics Concern   Not on file  Social History Narrative   Not on file   Social Drivers of Health   Financial Resource Strain: Low Risk  (02/27/2023)   Overall Financial Resource Strain (CARDIA)    Difficulty of Paying Living Expenses: Not very hard  Food Insecurity: No Food Insecurity (09/06/2023)   Hunger Vital Sign    Worried About Running Out of Food in the Last Year: Never true    Ran Out of Food in the Last Year: Never true  Transportation Needs: No Transportation Needs (09/06/2023)   PRAPARE - Administrator, Civil Service (Medical): No    Lack of Transportation (Non-Medical): No  Physical Activity: Sufficiently Active (02/27/2023)   Exercise Vital Sign    Days of Exercise per Week: 6 days    Minutes of Exercise per Session: 30 min  Stress:  No Stress Concern Present (02/27/2023)   [employer] of Occupational Health - Occupational Stress Questionnaire    Feeling of Stress : Not at all  Social Connections: Socially Integrated (02/27/2023)   Social Connection and Isolation Panel    Frequency of Communication with Friends and Family: More than three times a week    Frequency of Social Gatherings with Friends and Family: More than three times a week    Attends Religious Services: More than 4 times per year    Active Member of [employer] or Organizations: Not on file    Attends  Banker Meetings: More than 4 times per year    Marital Status: Married  Catering manager Violence: Not At Risk (02/27/2023)   Humiliation, Afraid, Rape, and Kick questionnaire    Fear of Current or Ex-Partner: No    Emotionally Abused: No    Physically Abused: No    Sexually Abused: No    FAMILY HISTORY: Family History  Problem Relation Age of Onset   Hypertension Mother    Diabetes Mother    Diabetes Father    Hypertension Father    Mental illness Father    Arthritis Maternal Grandmother    Cancer Maternal Grandmother    Diabetes Maternal Grandmother    Hypertension Maternal Grandmother    Stroke Maternal Grandmother    Breast cancer Maternal Grandmother    Stroke Maternal Grandfather    Arthritis Paternal Grandmother    Cancer Paternal Grandmother    Diabetes Paternal Grandmother    Hypertension Paternal Grandmother    Breast cancer Paternal Grandmother     ALLERGIES:  is allergic to sulfa antibiotics.  MEDICATIONS:  Current Outpatient Medications  Medication Sig Dispense Refill   amLODipine  (NORVASC ) 2.5 MG tablet Take 1 tablet (2.5 mg total) by mouth daily. 90 tablet 0   hydrochlorothiazide  (HYDRODIURIL ) 25 MG tablet Take 1 tablet (25 mg total) by mouth daily. 90 tablet 0   ibuprofen  (ADVIL ) 800 MG tablet Take 1 tablet (800 mg total) by mouth every 8 (eight) hours. (Patient not taking: Reported on 02/27/2023) 30 tablet 1   Multiple Vitamin (MULTIVITAMIN WITH MINERALS) TABS tablet Take 1 tablet by mouth daily.     oxyCODONE -acetaminophen  (PERCOCET/ROXICET) 5-325 MG tablet Take 1 tablet by mouth every 4 (four) hours as needed for moderate pain. (Patient not taking: Reported on 02/27/2023) 30 tablet 0   pantoprazole  (PROTONIX ) 40 MG tablet TAKE 1 TABLET(40 MG) BY MOUTH DAILY BEFORE BREAKFAST 90 tablet 1   sertraline  (ZOLOFT ) 25 MG tablet Take 1 tablet (25 mg total) by mouth daily. (Patient not taking: Reported on 08/29/2022) 30 tablet 2   No current  facility-administered medications for this visit.    REVIEW OF SYSTEMS:   .10 Point review of Systems was done is negative except as noted above.   PHYSICAL EXAMINATION: Not performed due to virtual visit.   LABORATORY DATA:  I have reviewed the data as listed .    Latest Ref Rng & Units 02/12/2024   11:04 AM 08/06/2023    9:17 AM 01/29/2023    9:38 AM  CBC  WBC 4.0 - 10.5 K/uL 8.8  8.3  8.4   Hemoglobin 12.0 - 15.0 g/dL 86.4  87.2  87.5   Hematocrit 36.0 - 46.0 % 39.5  38.1  36.9   Platelets 150 - 400 K/uL 276  304  164       Latest Ref Rng & Units 02/12/2024   11:04 AM 08/06/2023    9:17  AM 01/29/2023    9:38 AM  CMP  Glucose 70 - 99 mg/dL 90  84  897   BUN 6 - 20 mg/dL 12  12  12    Creatinine 0.44 - 1.00 mg/dL 9.13  9.11  9.16   Sodium 135 - 145 mmol/L 138  140  138   Potassium 3.5 - 5.1 mmol/L 4.1  3.4  3.9   Chloride 98 - 111 mmol/L 106  107  107   CO2 22 - 32 mmol/L 26  29  26    Calcium 8.9 - 10.3 mg/dL 9.1  8.6  9.0   Total Protein 6.5 - 8.1 g/dL 8.1  7.6  7.9   Total Bilirubin 0.0 - 1.2 mg/dL 0.6  0.3  0.2   Alkaline Phos 38 - 126 U/L 59  53  56   AST 15 - 41 U/L 10  11  10    ALT 0 - 44 U/L 11  10  11     . Lab Results  Component Value Date   IRON  59 02/12/2024   TIBC 315 02/12/2024   IRONPCTSAT 19 02/12/2024   (Iron  and TIBC)  Lab Results  Component Value Date   FERRITIN 105 02/12/2024      RADIOGRAPHIC STUDIES: I have personally reviewed the radiological images as listed and agreed with the findings in the report. No results found.  ASSESSMENT & PLAN:  Sarah Washington is a 38 y.o. female who presents for a virtual visit for TTP and iron  deficiency anemia.    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  --Severely low ADAMTS 13 levels of 6 on diagnosis. Now normalized to 69 ( ADAMTS13 from 11/26/17 ) --S/p Plasmapheresis x 5. --Completed Rituxan  weekly x 4 doses.  2.  Anemia-microcytic: --Most consistent with likely due to  iron  deficiency.   PLAN: -Labs from 02/12/2024 reviewed with patient. WBC 8.8, Hgb 13.5, Plt 276, Iron  panel shows no deficiency.  -Patient has no lab or clinical evidence of TTP relapse at this time. -Continue iron  polysaccharide 150 mg p.o. atleast 3 times weekly to maintain ferritin levels>50   FOLLOW-UP: 6 months: labs and virtual visit  All of the patient's questions were answered with apparent satisfaction. The patient knows to call the clinic with any problems, questions or concerns.   I have spent a total of 20 minutes minutes of non-face-to-face time, preparing to see the patient,  counseling and educating the patient, ordering medications/tests/procedures, documenting clinical information in the electronic health record, independently interpreting results and communicating results to the patient, and care coordination.   Johnston Police PA-C Dept of Hematology and Oncology [facility] Phone: [phone]

## 2024-06-08 ENCOUNTER — Other Ambulatory Visit: Payer: Self-pay | Admitting: Hematology

## 2024-06-08 DIAGNOSIS — M3119 Other thrombotic microangiopathy: Secondary | ICD-10-CM

## 2024-06-09 ENCOUNTER — Encounter: Payer: Self-pay | Admitting: Hematology

## 2024-08-19 ENCOUNTER — Other Ambulatory Visit

## 2024-08-26 ENCOUNTER — Ambulatory Visit: Admitting: Hematology
